# Patient Record
Sex: Female | Born: 1935 | Race: White | Hispanic: No | Marital: Single | Smoking: Former smoker
Health system: Southern US, Community
[De-identification: ages and names within clinical notes are randomized; demographics above are authoritative.]

## PROBLEM LIST (undated history)

## (undated) DIAGNOSIS — M1711 Unilateral primary osteoarthritis, right knee: Secondary | ICD-10-CM

## (undated) DIAGNOSIS — S22080A Wedge compression fracture of T11-T12 vertebra, initial encounter for closed fracture: Secondary | ICD-10-CM

## (undated) DIAGNOSIS — M5136 Other intervertebral disc degeneration, lumbar region: Secondary | ICD-10-CM

## (undated) DIAGNOSIS — M51369 Other intervertebral disc degeneration, lumbar region without mention of lumbar back pain or lower extremity pain: Secondary | ICD-10-CM

## (undated) DIAGNOSIS — E7800 Pure hypercholesterolemia, unspecified: Secondary | ICD-10-CM

## (undated) DIAGNOSIS — Z86010 Personal history of colonic polyps: Secondary | ICD-10-CM

## (undated) DIAGNOSIS — I2510 Atherosclerotic heart disease of native coronary artery without angina pectoris: Secondary | ICD-10-CM

## (undated) DIAGNOSIS — I10 Essential (primary) hypertension: Secondary | ICD-10-CM

## (undated) DIAGNOSIS — R6251 Failure to thrive (child): Secondary | ICD-10-CM

## (undated) DIAGNOSIS — E109 Type 1 diabetes mellitus without complications: Principal | ICD-10-CM

## (undated) DIAGNOSIS — I5032 Chronic diastolic (congestive) heart failure: Secondary | ICD-10-CM

## (undated) DIAGNOSIS — J309 Allergic rhinitis, unspecified: Secondary | ICD-10-CM

## (undated) DIAGNOSIS — F419 Anxiety disorder, unspecified: Secondary | ICD-10-CM

## (undated) DIAGNOSIS — G8929 Other chronic pain: Secondary | ICD-10-CM

## (undated) DIAGNOSIS — F329 Major depressive disorder, single episode, unspecified: Secondary | ICD-10-CM

## (undated) DIAGNOSIS — D649 Anemia, unspecified: Secondary | ICD-10-CM

## (undated) DIAGNOSIS — M48061 Spinal stenosis, lumbar region without neurogenic claudication: Secondary | ICD-10-CM

## (undated) DIAGNOSIS — M479 Spondylosis, unspecified: Secondary | ICD-10-CM

## (undated) DIAGNOSIS — I639 Cerebral infarction, unspecified: Secondary | ICD-10-CM

## (undated) DIAGNOSIS — K219 Gastro-esophageal reflux disease without esophagitis: Secondary | ICD-10-CM

## (undated) HISTORY — DX: Atherosclerotic heart disease of native coronary artery without angina pectoris: I25.10

## (undated) HISTORY — DX: Anxiety disorder, unspecified: F41.9

## (undated) HISTORY — DX: Anemia, unspecified: D64.9

## (undated) HISTORY — DX: Failure to thrive (child): R62.51

## (undated) HISTORY — DX: Pure hypercholesterolemia, unspecified: E78.00

## (undated) HISTORY — DX: Cerebral infarction, unspecified: I63.9

## (undated) HISTORY — PX: APPENDECTOMY: SHX54

## (undated) HISTORY — DX: Other intervertebral disc degeneration, lumbar region: M51.36

## (undated) HISTORY — PX: CHOLECYSTECTOMY: SHX55

## (undated) HISTORY — DX: Spondylosis, unspecified: M47.9

## (undated) HISTORY — DX: Major depressive disorder, single episode, unspecified: F32.9

## (undated) HISTORY — DX: Chronic diastolic (congestive) heart failure: I50.32

## (undated) HISTORY — PX: OTHER SURGICAL HISTORY: SHX169

## (undated) HISTORY — DX: Spinal stenosis, lumbar region without neurogenic claudication: M48.061

## (undated) HISTORY — DX: Personal history of colonic polyps: Z86.010

## (undated) HISTORY — DX: Other intervertebral disc degeneration, lumbar region without mention of lumbar back pain or lower extremity pain: M51.369

## (undated) HISTORY — DX: Type 1 diabetes mellitus without complications: E10.9

## (undated) HISTORY — DX: Other chronic pain: G89.29

## (undated) HISTORY — PX: ABDOMINAL HYSTERECTOMY: SHX81

## (undated) HISTORY — DX: Unilateral primary osteoarthritis, right knee: M17.11

## (undated) HISTORY — DX: Allergic rhinitis, unspecified: J30.9

## (undated) HISTORY — DX: Essential (primary) hypertension: I10

## (undated) HISTORY — DX: Gastro-esophageal reflux disease without esophagitis: K21.9

## (undated) HISTORY — DX: Wedge compression fracture of T11-T12 vertebra, initial encounter for closed fracture: S22.080A

---

## 1998-02-23 ENCOUNTER — Encounter: Admission: RE | Admit: 1998-02-23 | Discharge: 1998-05-24 | Payer: Self-pay | Admitting: Emergency Medicine

## 1999-06-24 ENCOUNTER — Encounter: Payer: Self-pay | Admitting: Orthopedic Surgery

## 1999-06-28 ENCOUNTER — Encounter: Payer: Self-pay | Admitting: Orthopedic Surgery

## 1999-06-28 ENCOUNTER — Encounter (INDEPENDENT_AMBULATORY_CARE_PROVIDER_SITE_OTHER): Payer: Self-pay | Admitting: Specialist

## 1999-06-28 ENCOUNTER — Observation Stay (HOSPITAL_COMMUNITY): Admission: RE | Admit: 1999-06-28 | Discharge: 1999-06-29 | Payer: Self-pay | Admitting: Orthopedic Surgery

## 2000-06-06 ENCOUNTER — Encounter: Admission: RE | Admit: 2000-06-06 | Discharge: 2000-06-06 | Payer: Self-pay | Admitting: Emergency Medicine

## 2000-06-06 ENCOUNTER — Encounter: Payer: Self-pay | Admitting: Emergency Medicine

## 2000-07-07 ENCOUNTER — Other Ambulatory Visit: Admission: RE | Admit: 2000-07-07 | Discharge: 2000-07-07 | Payer: Self-pay | Admitting: Emergency Medicine

## 2001-06-07 ENCOUNTER — Encounter: Payer: Self-pay | Admitting: Emergency Medicine

## 2001-06-07 ENCOUNTER — Encounter: Admission: RE | Admit: 2001-06-07 | Discharge: 2001-06-07 | Payer: Self-pay | Admitting: Emergency Medicine

## 2002-06-10 ENCOUNTER — Encounter: Admission: RE | Admit: 2002-06-10 | Discharge: 2002-06-10 | Payer: Self-pay | Admitting: Emergency Medicine

## 2002-06-10 ENCOUNTER — Encounter: Payer: Self-pay | Admitting: Emergency Medicine

## 2003-05-29 ENCOUNTER — Encounter: Payer: Self-pay | Admitting: Emergency Medicine

## 2003-05-29 ENCOUNTER — Encounter: Admission: RE | Admit: 2003-05-29 | Discharge: 2003-05-29 | Payer: Self-pay | Admitting: Emergency Medicine

## 2003-09-29 ENCOUNTER — Encounter: Admission: RE | Admit: 2003-09-29 | Discharge: 2003-09-29 | Payer: Self-pay | Admitting: Emergency Medicine

## 2004-06-01 ENCOUNTER — Encounter: Admission: RE | Admit: 2004-06-01 | Discharge: 2004-06-01 | Payer: Self-pay | Admitting: Emergency Medicine

## 2004-06-07 ENCOUNTER — Encounter: Admission: RE | Admit: 2004-06-07 | Discharge: 2004-06-07 | Payer: Self-pay | Admitting: Emergency Medicine

## 2004-11-05 ENCOUNTER — Ambulatory Visit: Payer: Self-pay | Admitting: Gastroenterology

## 2004-11-30 ENCOUNTER — Ambulatory Visit: Payer: Self-pay | Admitting: Gastroenterology

## 2004-12-02 ENCOUNTER — Encounter: Admission: RE | Admit: 2004-12-02 | Discharge: 2004-12-02 | Payer: Self-pay | Admitting: Emergency Medicine

## 2005-06-13 ENCOUNTER — Encounter: Admission: RE | Admit: 2005-06-13 | Discharge: 2005-06-13 | Payer: Self-pay | Admitting: General Surgery

## 2006-06-14 ENCOUNTER — Encounter: Admission: RE | Admit: 2006-06-14 | Discharge: 2006-06-14 | Payer: Self-pay | Admitting: Emergency Medicine

## 2007-03-07 ENCOUNTER — Ambulatory Visit: Payer: Self-pay | Admitting: Endocrinology

## 2007-03-22 ENCOUNTER — Ambulatory Visit: Payer: Self-pay | Admitting: Endocrinology

## 2007-04-12 ENCOUNTER — Ambulatory Visit: Payer: Self-pay | Admitting: Endocrinology

## 2007-05-03 ENCOUNTER — Ambulatory Visit: Payer: Self-pay | Admitting: Endocrinology

## 2007-05-30 ENCOUNTER — Encounter: Payer: Self-pay | Admitting: Endocrinology

## 2007-05-30 DIAGNOSIS — I10 Essential (primary) hypertension: Secondary | ICD-10-CM | POA: Insufficient documentation

## 2007-05-30 DIAGNOSIS — E109 Type 1 diabetes mellitus without complications: Secondary | ICD-10-CM

## 2007-05-30 HISTORY — DX: Type 1 diabetes mellitus without complications: E10.9

## 2007-05-30 HISTORY — DX: Essential (primary) hypertension: I10

## 2007-05-31 ENCOUNTER — Ambulatory Visit: Payer: Self-pay | Admitting: Endocrinology

## 2007-07-06 ENCOUNTER — Encounter: Admission: RE | Admit: 2007-07-06 | Discharge: 2007-07-06 | Payer: Self-pay | Admitting: Family Medicine

## 2007-08-06 ENCOUNTER — Ambulatory Visit: Payer: Self-pay | Admitting: Endocrinology

## 2007-08-06 LAB — CONVERTED CEMR LAB: Hgb A1c MFr Bld: 6.9 % — ABNORMAL HIGH (ref 4.6–6.0)

## 2007-09-11 ENCOUNTER — Encounter: Payer: Self-pay | Admitting: Endocrinology

## 2007-09-18 ENCOUNTER — Telehealth (INDEPENDENT_AMBULATORY_CARE_PROVIDER_SITE_OTHER): Payer: Self-pay | Admitting: *Deleted

## 2007-10-10 ENCOUNTER — Ambulatory Visit: Payer: Self-pay | Admitting: Endocrinology

## 2007-12-25 ENCOUNTER — Ambulatory Visit: Payer: Self-pay | Admitting: Endocrinology

## 2007-12-25 DIAGNOSIS — M479 Spondylosis, unspecified: Secondary | ICD-10-CM

## 2007-12-25 HISTORY — DX: Spondylosis, unspecified: M47.9

## 2007-12-25 LAB — CONVERTED CEMR LAB: Hgb A1c MFr Bld: 7.2 % — ABNORMAL HIGH (ref 4.6–6.0)

## 2008-01-03 ENCOUNTER — Emergency Department (HOSPITAL_COMMUNITY): Admission: EM | Admit: 2008-01-03 | Discharge: 2008-01-04 | Payer: Self-pay | Admitting: Emergency Medicine

## 2008-03-24 ENCOUNTER — Ambulatory Visit: Payer: Self-pay | Admitting: Endocrinology

## 2008-03-24 LAB — CONVERTED CEMR LAB: Hgb A1c MFr Bld: 7.2 % — ABNORMAL HIGH (ref 4.6–6.0)

## 2008-05-25 ENCOUNTER — Emergency Department (HOSPITAL_COMMUNITY): Admission: EM | Admit: 2008-05-25 | Discharge: 2008-05-25 | Payer: Self-pay | Admitting: Emergency Medicine

## 2008-05-26 ENCOUNTER — Inpatient Hospital Stay (HOSPITAL_COMMUNITY): Admission: EM | Admit: 2008-05-26 | Discharge: 2008-05-30 | Payer: Self-pay | Admitting: Emergency Medicine

## 2008-06-24 ENCOUNTER — Encounter: Admission: RE | Admit: 2008-06-24 | Discharge: 2008-06-24 | Payer: Self-pay | Admitting: Internal Medicine

## 2008-07-24 ENCOUNTER — Encounter: Admission: RE | Admit: 2008-07-24 | Discharge: 2008-07-24 | Payer: Self-pay | Admitting: Neurosurgery

## 2008-10-23 ENCOUNTER — Ambulatory Visit: Payer: Self-pay | Admitting: Endocrinology

## 2008-10-23 DIAGNOSIS — E7800 Pure hypercholesterolemia, unspecified: Secondary | ICD-10-CM

## 2008-10-23 HISTORY — DX: Pure hypercholesterolemia, unspecified: E78.00

## 2008-10-23 LAB — CONVERTED CEMR LAB
ALT: 27 units/L (ref 0–35)
Bilirubin, Direct: 0.1 mg/dL (ref 0.0–0.3)
CO2: 27 meq/L (ref 19–32)
Calcium: 9.4 mg/dL (ref 8.4–10.5)
Chloride: 101 meq/L (ref 96–112)
Direct LDL: 99.4 mg/dL
GFR calc non Af Amer: 65 mL/min
HDL: 33 mg/dL — ABNORMAL LOW (ref >39.0)
Sodium: 138 meq/L (ref 135–145)
Total Bilirubin: 0.4 mg/dL (ref 0.3–1.2)
Total CHOL/HDL Ratio: 4.5

## 2008-10-28 ENCOUNTER — Telehealth: Payer: Self-pay | Admitting: Endocrinology

## 2009-01-27 ENCOUNTER — Encounter: Admission: RE | Admit: 2009-01-27 | Discharge: 2009-01-27 | Payer: Self-pay | Admitting: Neurosurgery

## 2009-02-19 ENCOUNTER — Telehealth (INDEPENDENT_AMBULATORY_CARE_PROVIDER_SITE_OTHER): Payer: Self-pay | Admitting: *Deleted

## 2009-03-06 ENCOUNTER — Inpatient Hospital Stay (HOSPITAL_COMMUNITY): Admission: RE | Admit: 2009-03-06 | Discharge: 2009-03-08 | Payer: Self-pay | Admitting: Neurosurgery

## 2009-03-21 ENCOUNTER — Inpatient Hospital Stay (HOSPITAL_COMMUNITY): Admission: EM | Admit: 2009-03-21 | Discharge: 2009-03-26 | Payer: Self-pay | Admitting: Emergency Medicine

## 2009-03-25 ENCOUNTER — Ambulatory Visit: Payer: Self-pay | Admitting: Physical Medicine & Rehabilitation

## 2009-03-26 ENCOUNTER — Inpatient Hospital Stay (HOSPITAL_COMMUNITY)
Admission: RE | Admit: 2009-03-26 | Discharge: 2009-04-09 | Payer: Self-pay | Admitting: Physical Medicine & Rehabilitation

## 2009-03-26 ENCOUNTER — Ambulatory Visit: Payer: Self-pay | Admitting: Physical Medicine & Rehabilitation

## 2009-04-19 ENCOUNTER — Inpatient Hospital Stay (HOSPITAL_COMMUNITY): Admission: EM | Admit: 2009-04-19 | Discharge: 2009-04-21 | Payer: Self-pay | Admitting: Emergency Medicine

## 2009-04-28 ENCOUNTER — Inpatient Hospital Stay (HOSPITAL_COMMUNITY): Admission: RE | Admit: 2009-04-28 | Discharge: 2009-05-06 | Payer: Self-pay | Admitting: Neurosurgery

## 2009-06-24 ENCOUNTER — Telehealth: Payer: Self-pay | Admitting: Endocrinology

## 2009-06-26 ENCOUNTER — Emergency Department (HOSPITAL_COMMUNITY): Admission: EM | Admit: 2009-06-26 | Discharge: 2009-06-27 | Payer: Self-pay | Admitting: Emergency Medicine

## 2009-06-27 ENCOUNTER — Inpatient Hospital Stay (HOSPITAL_COMMUNITY): Admission: EM | Admit: 2009-06-27 | Discharge: 2009-07-07 | Payer: Self-pay | Admitting: Emergency Medicine

## 2009-06-30 ENCOUNTER — Encounter: Admission: RE | Admit: 2009-06-30 | Discharge: 2009-06-30 | Payer: Self-pay | Admitting: Internal Medicine

## 2009-07-30 ENCOUNTER — Encounter (INDEPENDENT_AMBULATORY_CARE_PROVIDER_SITE_OTHER): Payer: Self-pay | Admitting: Internal Medicine

## 2009-07-30 ENCOUNTER — Inpatient Hospital Stay (HOSPITAL_COMMUNITY): Admission: EM | Admit: 2009-07-30 | Discharge: 2009-08-01 | Payer: Self-pay | Admitting: Emergency Medicine

## 2009-07-30 ENCOUNTER — Ambulatory Visit: Payer: Self-pay | Admitting: Vascular Surgery

## 2009-08-27 ENCOUNTER — Encounter (INDEPENDENT_AMBULATORY_CARE_PROVIDER_SITE_OTHER): Payer: Self-pay | Admitting: *Deleted

## 2009-09-21 ENCOUNTER — Encounter: Payer: Self-pay | Admitting: Endocrinology

## 2009-11-16 ENCOUNTER — Ambulatory Visit: Payer: Self-pay | Admitting: Endocrinology

## 2009-11-16 LAB — CONVERTED CEMR LAB: Hgb A1c MFr Bld: 8.1 % — ABNORMAL HIGH (ref 4.6–6.5)

## 2009-11-30 ENCOUNTER — Ambulatory Visit: Payer: Self-pay | Admitting: Endocrinology

## 2009-12-03 ENCOUNTER — Telehealth: Payer: Self-pay | Admitting: Endocrinology

## 2009-12-14 ENCOUNTER — Ambulatory Visit: Payer: Self-pay | Admitting: Endocrinology

## 2010-01-13 ENCOUNTER — Ambulatory Visit: Payer: Self-pay | Admitting: Endocrinology

## 2010-01-14 ENCOUNTER — Encounter: Admission: RE | Admit: 2010-01-14 | Discharge: 2010-01-14 | Payer: Self-pay | Admitting: Family Medicine

## 2010-02-19 ENCOUNTER — Ambulatory Visit: Payer: Self-pay | Admitting: Endocrinology

## 2010-02-22 LAB — CONVERTED CEMR LAB: Hgb A1c MFr Bld: 7.8 % — ABNORMAL HIGH (ref 4.6–6.5)

## 2010-04-02 ENCOUNTER — Ambulatory Visit: Payer: Self-pay | Admitting: Endocrinology

## 2010-04-02 DIAGNOSIS — L97909 Non-pressure chronic ulcer of unspecified part of unspecified lower leg with unspecified severity: Secondary | ICD-10-CM | POA: Insufficient documentation

## 2010-04-07 ENCOUNTER — Encounter (HOSPITAL_BASED_OUTPATIENT_CLINIC_OR_DEPARTMENT_OTHER)
Admission: RE | Admit: 2010-04-07 | Discharge: 2010-07-06 | Payer: Self-pay | Source: Home / Self Care | Admitting: Internal Medicine

## 2010-04-08 ENCOUNTER — Encounter: Payer: Self-pay | Admitting: Endocrinology

## 2010-04-08 ENCOUNTER — Ambulatory Visit (HOSPITAL_COMMUNITY): Admission: RE | Admit: 2010-04-08 | Discharge: 2010-04-08 | Payer: Self-pay | Admitting: Internal Medicine

## 2010-04-09 ENCOUNTER — Telehealth: Payer: Self-pay | Admitting: Endocrinology

## 2010-04-14 ENCOUNTER — Ambulatory Visit: Payer: Self-pay | Admitting: Vascular Surgery

## 2010-06-01 ENCOUNTER — Encounter: Payer: Self-pay | Admitting: Endocrinology

## 2010-06-11 ENCOUNTER — Ambulatory Visit: Payer: Self-pay | Admitting: Endocrinology

## 2010-06-11 LAB — CONVERTED CEMR LAB: Hgb A1c MFr Bld: 7.2 % — ABNORMAL HIGH (ref 4.6–6.5)

## 2010-07-02 ENCOUNTER — Telehealth: Payer: Self-pay | Admitting: Endocrinology

## 2010-07-13 ENCOUNTER — Encounter (HOSPITAL_BASED_OUTPATIENT_CLINIC_OR_DEPARTMENT_OTHER)
Admission: RE | Admit: 2010-07-13 | Discharge: 2010-08-24 | Payer: Self-pay | Source: Home / Self Care | Admitting: General Surgery

## 2010-08-09 ENCOUNTER — Encounter: Payer: Self-pay | Admitting: Endocrinology

## 2010-09-01 ENCOUNTER — Encounter: Payer: Self-pay | Admitting: Endocrinology

## 2010-09-03 ENCOUNTER — Ambulatory Visit: Payer: Self-pay | Admitting: Endocrinology

## 2010-09-03 LAB — CONVERTED CEMR LAB: Hgb A1c MFr Bld: 7.4 % — ABNORMAL HIGH (ref 4.6–6.5)

## 2010-09-24 ENCOUNTER — Encounter (HOSPITAL_BASED_OUTPATIENT_CLINIC_OR_DEPARTMENT_OTHER)
Admission: RE | Admit: 2010-09-24 | Discharge: 2010-10-07 | Payer: Self-pay | Source: Home / Self Care | Attending: General Surgery | Admitting: General Surgery

## 2010-09-30 ENCOUNTER — Encounter: Payer: Self-pay | Admitting: Endocrinology

## 2010-10-18 IMAGING — CR DG HIP W/ PELVIS BILAT
5 series · 5 of 5 positions shown · non-contrast
Comparison: None

CLINICAL DATA: Fell yesterday with back pain and bilateral hip pain
radiating down the legs

BILATERAL HIP WITH PELVIS - 4+ VIEW

[t pelvis a.p. *]
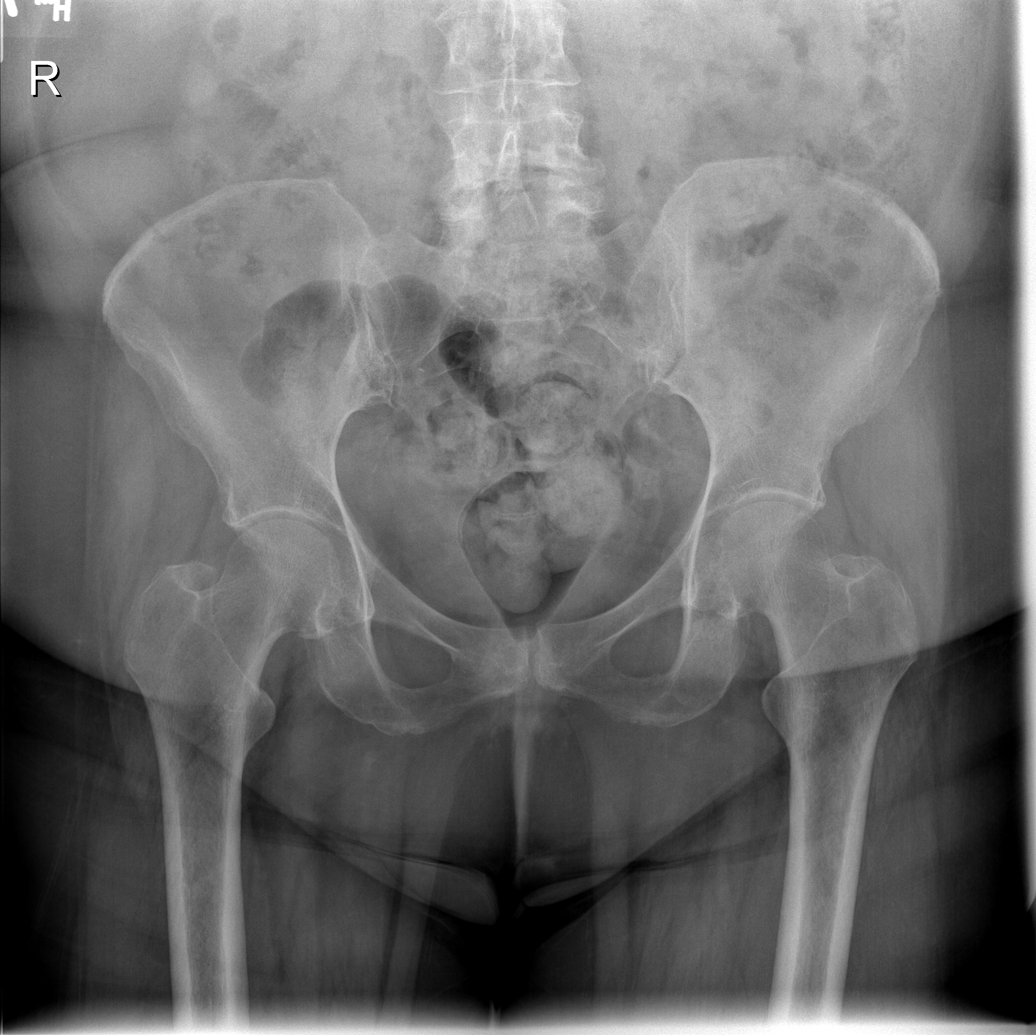

[t hip ap right *]
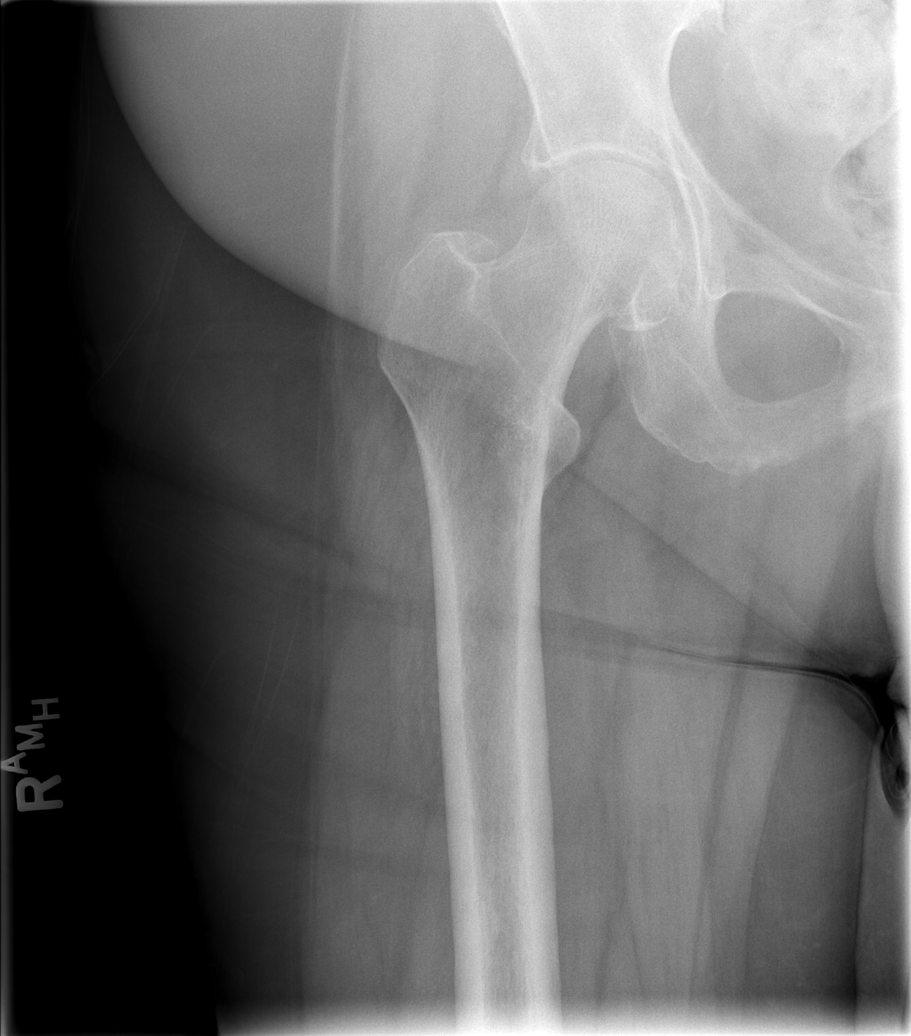

[t hip ap left *]
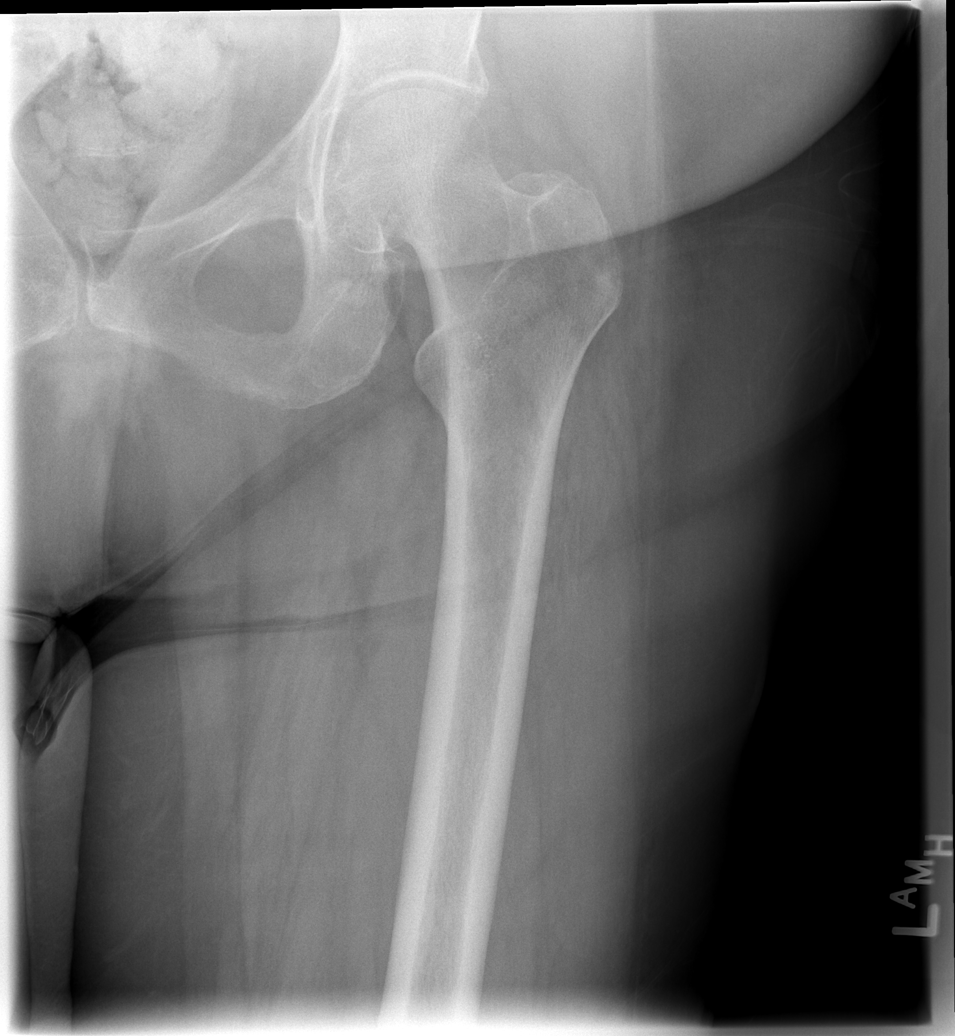

[t hip frog leg left *]
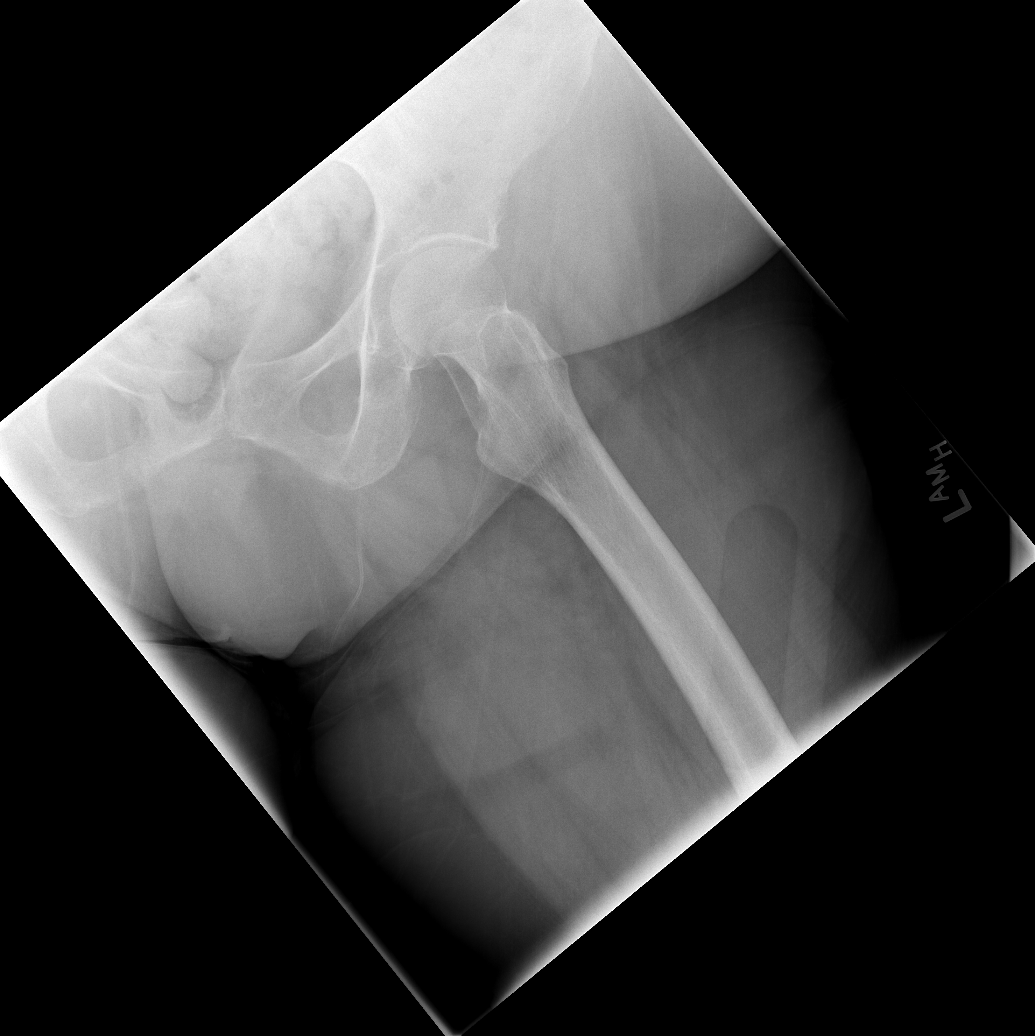

[t hip frog leg right *]
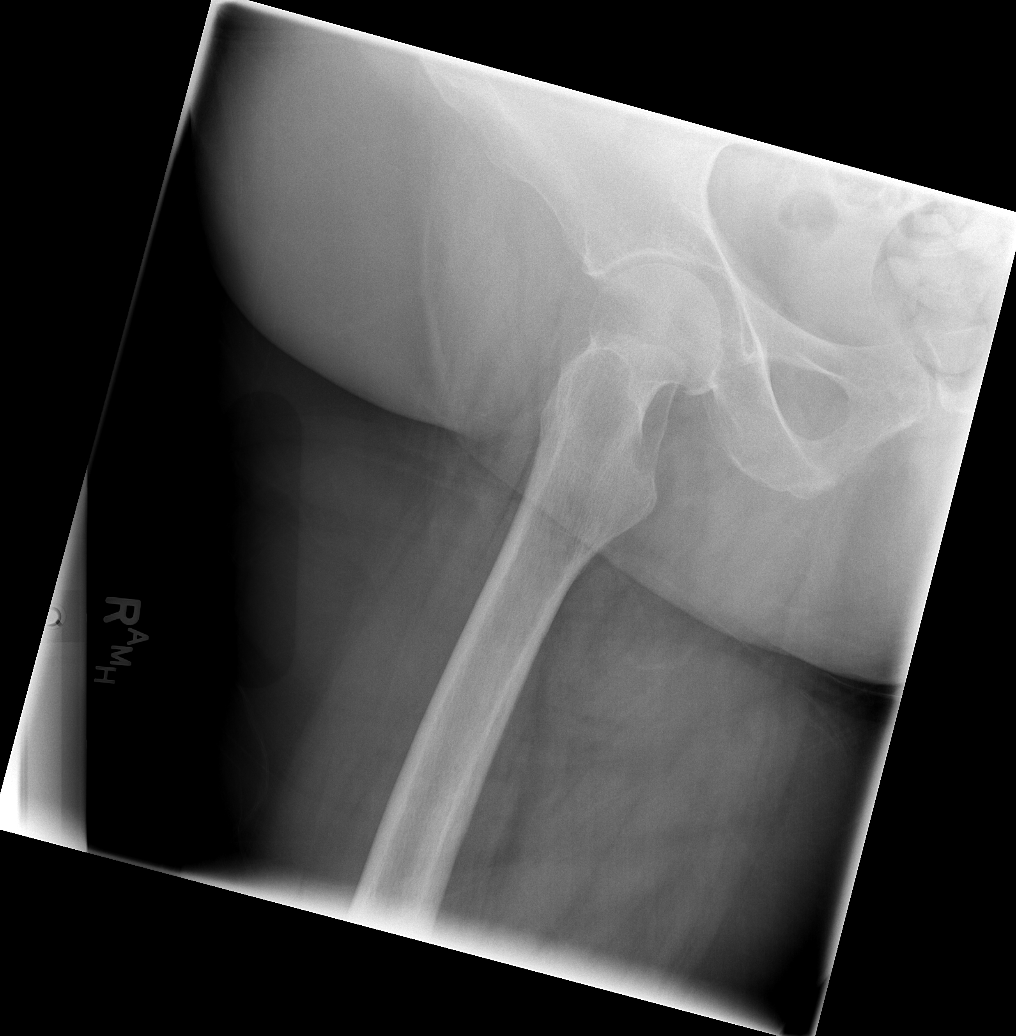

[5 of 5 positions shown; findings below may reference images not displayed]

FINDINGS: A view of the pelvis and two views of both hips were
obtained.  No acute fracture is seen.  The pelvic rami are intact.
The SI joints appear normal.  There are degenerative changes at the
L4-5 level.
IMPRESSION: No acute fracture.  Degenerative change at L4-5.

## 2010-11-16 NOTE — Assessment & Plan Note (Signed)
Summary: 3 mth fu--stc   Vital Signs:  Patient profile:   77 year old female Height:      63 inches (160.02 cm) Weight:      192.38 pounds (87.45 kg) BMI:     34.20 O2 Sat:      95 % on Room air Temp:     99.1 degrees F (37.28 degrees C) oral Pulse rate:   96 / minute BP sitting:   138 / 70  (left arm) Cuff size:   large  Vitals Entered By: Brenton Grills CMA Duncan Dull) (September 03, 2010 1:07 PM)  O2 Flow:  Room air CC: 3 month F/U/pt is no longer taking Zetia/aj Is Patient Diabetic? Yes   Primary Provider:  a ross  CC:  3 month F/U/pt is no longer taking Zetia/aj.  History of Present Illness: she brings a record of her cbg's which i have reviewed today.  it is lowest in am (60-90).  it is highest before lunch (mid-100's).  pt states she feels well in general.  Current Medications (verified): 1)  Chlordiazepoxide Hcl 5 Mg  Caps (Chlordiazepoxide Hcl) .... Take 1 By Mouth Two Times A Day Qd 2)  Prilosec 20 Mg  Cpdr (Omeprazole) .... Take 1 By Mouth Qd 3)  Reglan 10 Mg  Tabs (Metoclopramide Hcl) .... Take 1 By Mouth Qd 4)  Prinzide 20-25 Mg  Tabs (Lisinopril-Hydrochlorothiazide) .... Take 1 By Mouth Qd 5)  Lipitor 80 Mg  Tabs (Atorvastatin Calcium) .... Take 1 By Mouth Qd 6)  Pamelor 25 Mg  Caps (Nortriptyline Hcl) .... Take 1 By Mouth Qd 7)  Allegra 180 Mg  Tabs (Fexofenadine Hcl) .... Take 1 By Mouth Qd 8)  Flonase 50 Mcg/act  Susp (Fluticasone Propionate) .... Use 1 Spray Each Nostril Qhs 9)  Evista 60 Mg  Tabs (Raloxifene Hcl) .... Take 1 By Mouth Qd 10)  Toprol Xl 25 Mg  Tb24 (Metoprolol Succinate) .... Take 1 By Mouth Qd 11)  Humalog 100 Unit/ml Soln (Insulin Lispro (Human)) .... Inject Tid (Qac) 40-30-45 Units 12)  Humulin N 100 Unit/ml Susp (Insulin Isophane Human) .... Inject 70 Unit Subcutaneously Every Night 13)  Prozac 20 Mg  Caps (Fluoxetine Hcl) .... Take 1 By Mouth Qd 14)  Zetia 10 Mg  Tabs (Ezetimibe) .... Take 1 By Mouth Qd 15)  Ultilet Insulin Syringe Jeannifer Drakeford  30g X 5/16" 1 Ml  Misc (Insulin Syringe-Needle U-100) .... Use Qid 16)  Accu-Chek Comfort Curve   Strp (Glucose Blood) .... Use As Directed 17)  Accu-Chek Aviva  Strp (Glucose Blood) .... Check Blood Sugar Qid 18)  Bd Insulin Syringe Ultrafine 31g X 5/16" 1 Ml Misc (Insulin Syringe-Needle U-100) .... Use As Directed Qid 19)  Oxycodone-Acetaminophen 5-325 Mg Tabs (Oxycodone-Acetaminophen) .Marland Kitchen.. 1 or 2 Tabs Daily 20)  Lisinopril 20 Mg Tabs (Lisinopril) .Marland Kitchen.. 1 in Am 21)  Furosemide 20 Mg Tabs (Furosemide) .... 2 in Am 22)  Gabapentin 400 Mg Caps (Gabapentin) .Marland Kitchen.. 1 Every 8 Hours  Allergies (verified): 1)  ! Demerol 2)  ! Codeine 3)  ! Voltaren 4)  ! Erythromycin 5)  ! Sulfa 6)  ! Ceclor 7)  ! Baclofen 8)  ! Darvocet 9)  ! * Promethazine  Past History:  Past Medical History: Last updated: 06/11/2010 FOOT ULCER, RIGHT (ICD-707.10) HYPERCHOLESTEROLEMIA (ICD-272.0) ANTIHYPERLIPIDEMIC USE, LONG TERM (ICD-V58.69) OSTEOARTHRITIS, LUMBAR SPINE (ICD-721.90) HYPERTENSION (ICD-401.9) DIABETES MELLITUS, TYPE I (ICD-250.01)  Review of Systems  The patient denies syncope.    Physical Exam  General:  normal appearance.   Extremities:  (sees wound-care) Skin:  injection sites at anterior abdomen are normal except for a few ecchymoses. Additional Exam:  Hemoglobin A1C       [H]  7.4 %     Impression & Recommendations:  Problem # 1:  DIABETES MELLITUS, TYPE I (ICD-250.01) she needs some adjustment in her therapy  Medications Added to Medication List This Visit: 1)  Humalog 100 Unit/ml Soln (Insulin lispro (human)) .... Inject tid (qac) 40-35-45 units 2)  Humulin N 100 Unit/ml Susp (Insulin isophane human) .... Inject 60 unit subcutaneously every night  Other Orders: TLB-A1C / Hgb A1C (Glycohemoglobin) (83036-A1C) Est. Patient Level III (16109)  Patient Instructions: 1)  blood tests are being ordered for you today.  please call [phone] to hear your test results. 2)  pending the  test results, please: 3)  reduce nph insulin to 60 units at night. 4)  continue humalog three times a day (just before each meal) 40-30-45 units. 5)  Please schedule a follow-up appointment in 4 months. 6)  check your blood sugar 4 times a day--before the 3 meals, and at bedtime.  also check if you have symptoms of your blood sugar being too high or too low.  please keep a record of the readings and bring it to your next appointment here.  please call us sooner if you are having low blood sugar episodes. 7)  (update: i left message on phone-tree:  increase breakfast humalog to 35 units). Prescriptions: HUMALOG 100 UNIT/ML SOLN (INSULIN LISPRO (HUMAN)) Inject tid (qac) 40-30-45 units  #1 vial x 0   Entered and Authorized by:   Minus Breeding MD   Signed by:   Minus Breeding MD on 09/03/2010   Method used:   Print then Give to Patient   RxID:   [id number]    Orders Added: 1)  TLB-A1C / Hgb A1C (Glycohemoglobin) [83036-A1C] 2)  Est. Patient Level III [86578]

## 2010-11-16 NOTE — Progress Notes (Signed)
Summary: CBG  Phone Note Call from Patient Call back at Home Phone [phone]   Caller: Patient Summary of Call: pt called stating that her CBGs have been dropping during the night. pt says that yesterday morning @ 4:30 - 71. This morning @ 1:30 - 66. pt is very concerned about this, please advise Initial call taken by: Margaret Pyle, CMA,  December 03, 2009 2:40 PM  Follow-up for Phone Call        reduce nph back to 60 units at bedtime  Follow-up by: Minus Breeding MD,  December 03, 2009 2:55 PM  Additional Follow-up for Phone Call Additional follow up Details #1::        pt informed Additional Follow-up by: Margaret Pyle, CMA,  December 03, 2009 3:33 PM

## 2010-11-16 NOTE — Progress Notes (Signed)
Summary: [facility]  Phone Note Other Incoming   CallerJudeth [place] with [facility] [phone] Summary of Call: HHRN called to inform MD that she was sent to eval pt's wound by wound center. Rn wants MD to be aware that pt is non-compliant with DM. Her CBG today was 368 and it was only then that she took 40u of Humolog. [facility] will start pt on DM teaching as well as wound care. RN does not think pt took her insulin any other time during the day. Initial call taken by: Margaret Pyle, CMA,  April 09, 2010 4:10 PM  Follow-up for Phone Call        noted, thank you Follow-up by: Minus Breeding MD,  April 09, 2010 6:44 PM

## 2010-11-16 NOTE — Assessment & Plan Note (Signed)
Summary: 1 MTH FU  STC  RS'D PER PT/NWS   Vital Signs:  Patient profile:   77 year old female Height:      63 inches (160.02 cm) Weight:      183.25 pounds (83.30 kg) O2 Sat:      97 % on Room air Temp:     98.3 degrees F (36.83 degrees C) oral Pulse rate:   96 / minute BP sitting:   148 / 82  (left arm) Cuff size:   large  Vitals Entered By: Josph Macho RMA (Feb 19, 2010 1:17 PM)  O2 Flow:  Room air CC: 1 month follow up/ pt states she is taking Prednisone but her last one is tomorrow/ CF Is Patient Diabetic? Yes   Primary Provider:  a ross  CC:  1 month follow up/ pt states she is taking Prednisone but her last one is tomorrow/ CF.  History of Present Illness: she brings a record of her cbg's which i have reviewed today.  it is in the high-100's, with no trend throughout the day.  pt states she feels well in general, except for left shoulder bursitis.  Current Medications (verified): 1)  Chlordiazepoxide Hcl 5 Mg  Caps (Chlordiazepoxide Hcl) .... Take 1 By Mouth Two Times A Day Qd 2)  Prilosec 20 Mg  Cpdr (Omeprazole) .... Take 1 By Mouth Qd 3)  Reglan 10 Mg  Tabs (Metoclopramide Hcl) .... Take 1 By Mouth Qd 4)  Prinzide 20-25 Mg  Tabs (Lisinopril-Hydrochlorothiazide) .... Take 1 By Mouth Qd 5)  Lipitor 80 Mg  Tabs (Atorvastatin Calcium) .... Take 1 By Mouth Qd 6)  Pamelor 25 Mg  Caps (Nortriptyline Hcl) .... Take 1 By Mouth Qd 7)  Allegra 180 Mg  Tabs (Fexofenadine Hcl) .... Take 1 By Mouth Qd 8)  Flonase 50 Mcg/act  Susp (Fluticasone Propionate) .... Use 1 Spray Each Nostril Qhs 9)  Evista 60 Mg  Tabs (Raloxifene Hcl) .... Take 1 By Mouth Qd 10)  Toprol Xl 25 Mg  Tb24 (Metoprolol Succinate) .... Take 1 By Mouth Qd 11)  Humalog 100 Unit/ml Soln (Insulin Lispro (Human)) .... Inject Tid (Qac) 35-15-40 Units 12)  Humulin N 100 Unit/ml Susp (Insulin Isophane Human) .... Inject 70 Unit Subcutaneously Every Night 13)  Prozac 20 Mg  Caps (Fluoxetine Hcl) .... Take 1 By Mouth  Qd 14)  Zetia 10 Mg  Tabs (Ezetimibe) .... Take 1 By Mouth Qd 15)  Ultilet Insulin Syringe Short 30g X 5/16" 1 Ml  Misc (Insulin Syringe-Needle U-100) .... Use Qid 16)  Accu-Chek Comfort Curve   Strp (Glucose Blood) .... Use As Directed 17)  Accu-Chek Aviva  Strp (Glucose Blood) .... Check Blood Sugar Qid 18)  Bd Insulin Syringe Ultrafine 31g X 5/16" 1 Ml Misc (Insulin Syringe-Needle U-100) .... Use As Directed Qid 19)  Oxycodone-Acetaminophen 5-325 Mg Tabs (Oxycodone-Acetaminophen) .Marland Kitchen.. 1 or 2 Tabs Daily 20)  Lisinopril 20 Mg Tabs (Lisinopril) .... 2 in Am 21)  Furosemide 20 Mg Tabs (Furosemide) .... 2 in Am 22)  Gabapentin 400 Mg Caps (Gabapentin) .Marland Kitchen.. 1 Every 8 Hours  Allergies (verified): 1)  ! Demerol 2)  ! Codeine 3)  ! Voltaren 4)  ! Erythromycin 5)  ! Sulfa 6)  ! Ceclor 7)  ! Baclofen 8)  ! Darvocet 9)  ! * Promethazine  Past History:  Past Medical History: Last updated: 12/25/2007 Dyslipidemia  OSTEOARTHRITIS, LUMBAR SPINE (ICD-721.90) HYPERTENSION (ICD-401.9) DIABETES MELLITUS, TYPE I (ICD-250.01)  Review of Systems  The patient denies hypoglycemia.    Physical Exam  General:  normal appearance.   Psych:  Alert and cooperative; normal mood and affect; normal attention span and concentration.     Impression & Recommendations:  Problem # 1:  DIABETES MELLITUS, TYPE I (ICD-250.01) needs increased rx  Medications Added to Medication List This Visit: 1)  Humalog 100 Unit/ml Soln (Insulin lispro (human)) .... Inject tid (qac) 40-20-45 units  Other Orders: TLB-A1C / Hgb A1C (Glycohemoglobin) (83036-A1C) Est. Patient Level III (78295)  Patient Instructions: 1)  continue nph insulin 70 units at night. 2)  increase humalog to three times a day (just before each meal) 40-20-45 units. 3)  Please schedule a follow-up appointment in 6 weeks. 4)  check your blood sugar 4 times a day--before the 3 meals, and at bedtime.  also check if you have symptoms of your blood  sugar being too high or too low.  please keep a record of the readings and bring it to your next appointment here.  please call us sooner if you are having low blood sugar episodes.

## 2010-11-16 NOTE — Assessment & Plan Note (Signed)
Summary: 2 wk fu---stc   Vital Signs:  Patient profile:   77 year old female Height:      63 inches (160.02 cm) Weight:      194.50 pounds (88.41 kg) O2 Sat:      95 % on Room air Temp:     97.3 degrees F (36.28 degrees C) oral Pulse rate:   109 / minute BP sitting:   182 / 92  (left arm) Cuff size:   large  Vitals Entered By: Josph Macho RMA (December 14, 2009 8:58 AM)  O2 Flow:  Room air CC: 2 week follow up/ CF Is Patient Diabetic? Yes   Primary Provider:  a ross  CC:  2 week follow up/ CF.  History of Present Illness: since last ov, pt has had 3 episodes of hypoglycemia at hs.  she brings a record of her cbg's which i have reviewed today.  it is highest before lunch (200).  she tripped and fell at home 1 week ago, and bruised her left knee.  Current Medications (verified): 1)  Chlordiazepoxide Hcl 5 Mg  Caps (Chlordiazepoxide Hcl) .... Take 1 By Mouth Two Times A Day Qd 2)  Prilosec 20 Mg  Cpdr (Omeprazole) .... Take 1 By Mouth Qd 3)  Reglan 10 Mg  Tabs (Metoclopramide Hcl) .... Take 1 By Mouth Qd 4)  Prinzide 20-25 Mg  Tabs (Lisinopril-Hydrochlorothiazide) .... Take 1 By Mouth Qd 5)  Lipitor 80 Mg  Tabs (Atorvastatin Calcium) .... Take 1 By Mouth Qd 6)  Pamelor 25 Mg  Caps (Nortriptyline Hcl) .... Take 1 By Mouth Qd 7)  Allegra 180 Mg  Tabs (Fexofenadine Hcl) .... Take 1 By Mouth Qd 8)  Flonase 50 Mcg/act  Susp (Fluticasone Propionate) .... Use 1 Spray Each Nostril Qhs 9)  Evista 60 Mg  Tabs (Raloxifene Hcl) .... Take 1 By Mouth Qd 10)  Toprol Xl 25 Mg  Tb24 (Metoprolol Succinate) .... Take 1 By Mouth Qd 11)  Humalog 100 Unit/ml Soln (Insulin Lispro (Human)) .... Inject Tid (Qac) 30-15-50 Units 12)  Humulin N 100 Unit/ml Susp (Insulin Isophane Human) .... Inject 75 Unit Subcutaneously Every Night 13)  Prozac 20 Mg  Caps (Fluoxetine Hcl) .... Take 1 By Mouth Qd 14)  Zetia 10 Mg  Tabs (Ezetimibe) .... Take 1 By Mouth Qd 15)  Ultilet Insulin Syringe Short 30g X 5/16"  1 Ml  Misc (Insulin Syringe-Needle U-100) .... Use Qid 16)  Accu-Chek Comfort Curve   Strp (Glucose Blood) .... Use As Directed 17)  Accu-Chek Aviva  Strp (Glucose Blood) .... Check Blood Sugar Qid 18)  Bd Insulin Syringe Ultrafine 31g X 5/16" 1 Ml Misc (Insulin Syringe-Needle U-100) .... Use As Directed Qid 19)  Oxycodone-Acetaminophen 5-325 Mg Tabs (Oxycodone-Acetaminophen) .Marland Kitchen.. 1 or 2 Tabs Daily 20)  Lisinopril 20 Mg Tabs (Lisinopril) .... 2 in Am 21)  Furosemide 20 Mg Tabs (Furosemide) .... 2 in Am 22)  Gabapentin 400 Mg Caps (Gabapentin) .Marland Kitchen.. 1 Every 8 Hours  Allergies (verified): 1)  ! Demerol 2)  ! Codeine 3)  ! Voltaren 4)  ! Erythromycin 5)  ! Sulfa 6)  ! Ceclor 7)  ! Baclofen 8)  ! Darvocet 9)  ! * Promethazine  Past History:  Past Medical History: Last updated: 12/25/2007 Dyslipidemia  OSTEOARTHRITIS, LUMBAR SPINE (ICD-721.90) HYPERTENSION (ICD-401.9) DIABETES MELLITUS, TYPE I (ICD-250.01)  Social History: Reviewed history from 11/16/2009 and no changes required. separated 2010 lives alone  Review of Systems  The patient denies syncope.  Physical Exam  General:  obese.  no distress  Msk:  gait is slow but steady (she has a cane but is not using it).   Impression & Recommendations:  Problem # 1:  DIABETES MELLITUS, TYPE I (ICD-250.01) she needs slight adjustments according to her cbg record.  Medications Added to Medication List This Visit: 1)  Humalog 100 Unit/ml Soln (Insulin lispro (human)) .... Inject tid (qac) 35-15-40 units  Other Orders: Est. Patient Level III (14782)  Patient Instructions: 1)  continue nph insulin 75 units at night. 2)  change humalog three times a day (just before each meal) 35-15-40 units. 3)  Please schedule a follow-up appointment in 39month. 4)  check your blood sugar 4 times a day--before the 3 meals, and at bedtime.  also check if you have symptoms of your blood sugar being too high or too low.  please keep a  record of the readings and bring it to your next appointment here.  please call us sooner if you are having low blood sugar episodes. 5)  this is up to your primary dr Tenny Craw, but i think you should use your cane.  also please see dr Tenny Craw for your blood pressure.

## 2010-11-16 NOTE — Assessment & Plan Note (Signed)
Summary: 2 MTH FU--STC   Vital Signs:  Patient profile:   77 year old female Height:      63 inches (160.02 cm) Weight:      183 pounds (83.18 kg) BMI:     32.53 O2 Sat:      95 % on Room air Temp:     97.8 degrees F (36.56 degrees C) oral Pulse rate:   86 / minute BP sitting:   116 / 64  (left arm) Cuff size:   large  Vitals Entered By: Brenton Grills MA (June 11, 2010 1:08 PM)  O2 Flow:  Room air CC: 2 month F/U/aj Is Patient Diabetic? Yes   Primary Provider:  a ross  CC:  2 month F/U/aj.  History of Present Illness: pt states he feels well in general.  she brings a record of her cbg's which i have reviewed today.  they are consistently in the low-100's, with no trend throughout the day.    Current Medications (verified): 1)  Chlordiazepoxide Hcl 5 Mg  Caps (Chlordiazepoxide Hcl) .... Take 1 By Mouth Two Times A Day Qd 2)  Prilosec 20 Mg  Cpdr (Omeprazole) .... Take 1 By Mouth Qd 3)  Reglan 10 Mg  Tabs (Metoclopramide Hcl) .... Take 1 By Mouth Qd 4)  Prinzide 20-25 Mg  Tabs (Lisinopril-Hydrochlorothiazide) .... Take 1 By Mouth Qd 5)  Lipitor 80 Mg  Tabs (Atorvastatin Calcium) .... Take 1 By Mouth Qd 6)  Pamelor 25 Mg  Caps (Nortriptyline Hcl) .... Take 1 By Mouth Qd 7)  Allegra 180 Mg  Tabs (Fexofenadine Hcl) .... Take 1 By Mouth Qd 8)  Flonase 50 Mcg/act  Susp (Fluticasone Propionate) .... Use 1 Spray Each Nostril Qhs 9)  Evista 60 Mg  Tabs (Raloxifene Hcl) .... Take 1 By Mouth Qd 10)  Toprol Xl 25 Mg  Tb24 (Metoprolol Succinate) .... Take 1 By Mouth Qd 11)  Humalog 100 Unit/ml Soln (Insulin Lispro (Human)) .... Inject Tid (Qac) 40-30-45 Units 12)  Humulin N 100 Unit/ml Susp (Insulin Isophane Human) .... Inject 70 Unit Subcutaneously Every Night 13)  Prozac 20 Mg  Caps (Fluoxetine Hcl) .... Take 1 By Mouth Qd 14)  Zetia 10 Mg  Tabs (Ezetimibe) .... Take 1 By Mouth Qd 15)  Ultilet Insulin Syringe Short 30g X 5/16" 1 Ml  Misc (Insulin Syringe-Needle U-100) .... Use  Qid 16)  Accu-Chek Comfort Curve   Strp (Glucose Blood) .... Use As Directed 17)  Accu-Chek Aviva  Strp (Glucose Blood) .... Check Blood Sugar Qid 18)  Bd Insulin Syringe Ultrafine 31g X 5/16" 1 Ml Misc (Insulin Syringe-Needle U-100) .... Use As Directed Qid 19)  Oxycodone-Acetaminophen 5-325 Mg Tabs (Oxycodone-Acetaminophen) .Marland Kitchen.. 1 or 2 Tabs Daily 20)  Lisinopril 20 Mg Tabs (Lisinopril) .Marland Kitchen.. 1 in Am 21)  Furosemide 20 Mg Tabs (Furosemide) .... 2 in Am 22)  Gabapentin 400 Mg Caps (Gabapentin) .Marland Kitchen.. 1 Every 8 Hours  Allergies (verified): 1)  ! Demerol 2)  ! Codeine 3)  ! Voltaren 4)  ! Erythromycin 5)  ! Sulfa 6)  ! Ceclor 7)  ! Baclofen 8)  ! Darvocet 9)  ! * Promethazine  Past History:  Past Medical History: FOOT ULCER, RIGHT (ICD-707.10) HYPERCHOLESTEROLEMIA (ICD-272.0) ANTIHYPERLIPIDEMIC USE, LONG TERM (ICD-V58.69) OSTEOARTHRITIS, LUMBAR SPINE (ICD-721.90) HYPERTENSION (ICD-401.9) DIABETES MELLITUS, TYPE I (ICD-250.01)  Review of Systems  The patient denies hypoglycemia.    Physical Exam  General:  obese.   Msk:  gait is slow but steady (she has  a cane but is not using it). Additional Exam:  Hemoglobin A1C       [H]  7.2 %    Impression & Recommendations:  Problem # 1:  DIABETES MELLITUS, TYPE I (ICD-250.01) this is the best control this pt should aim for, given her advanced age and variable cbg's  Medications Added to Medication List This Visit: 1)  Lisinopril 20 Mg Tabs (Lisinopril) .Marland Kitchen.. 1 in am  Other Orders: Est. Patient Level III (99213) TLB-A1C / Hgb A1C (Glycohemoglobin) (83036-A1C)  Patient Instructions: 1)  blood tests are being ordered for you today.  please call [phone] to hear your test results. 2)  pending the test results, please: 3)  continue nph insulin 70 units at night. 4)  increase humalog to three times a day (just before each meal) 40-30-45 units. 5)  Please schedule a follow-up appointment in 3 months. 6)  check your blood sugar 4  times a day--before the 3 meals, and at bedtime.  also check if you have symptoms of your blood sugar being too high or too low.  please keep a record of the readings and bring it to your next appointment here.  please call us sooner if you are having low blood sugar episodes.

## 2010-11-16 NOTE — Medication Information (Signed)
Summary: Diabetes Supplies/[facility]  Diabetes Supplies/[facility]   Imported By: Sherian Rein 09/06/2010 07:40:57  _____________________________________________________________________  External Attachment:    Type:   Image     Comment:   External Document

## 2010-11-16 NOTE — Assessment & Plan Note (Signed)
Summary: 2 WK FU  STC   Vital Signs:  Patient profile:   77 year old female Height:      63 inches (160.02 cm) Weight:      187.13 pounds (85.06 kg) O2 Sat:      96 % on Room air Temp:     96.8 degrees F (36 degrees C) oral Pulse rate:   112 / minute BP sitting:   138 / 72  (left arm) Cuff size:   large  Vitals Entered By: Josph Macho CMA (November 30, 2009 7:53 AM)  O2 Flow:  Room air CC: 2 week follow up/ CF Is Patient Diabetic? Yes   Primary Provider:  barnes  CC:  2 week follow up/ CF.  History of Present Illness: pt states she feels well in general.  she brings a record of her cbg's which i have reviewed today. it varies from 90-200.  it is in general highest in am, and lower at all other times of day.  Current Medications (verified): 1)  Chlordiazepoxide Hcl 5 Mg  Caps (Chlordiazepoxide Hcl) .... Take 1 By Mouth Two Times A Day Qd 2)  Prilosec 20 Mg  Cpdr (Omeprazole) .... Take 1 By Mouth Qd 3)  Reglan 10 Mg  Tabs (Metoclopramide Hcl) .... Take 1 By Mouth Qd 4)  Prinzide 20-25 Mg  Tabs (Lisinopril-Hydrochlorothiazide) .... Take 1 By Mouth Qd 5)  Lipitor 80 Mg  Tabs (Atorvastatin Calcium) .... Take 1 By Mouth Qd 6)  Pamelor 25 Mg  Caps (Nortriptyline Hcl) .... Take 1 By Mouth Qd 7)  Allegra 180 Mg  Tabs (Fexofenadine Hcl) .... Take 1 By Mouth Qd 8)  Flonase 50 Mcg/act  Susp (Fluticasone Propionate) .... Use 1 Spray Each Nostril Qhs 9)  Evista 60 Mg  Tabs (Raloxifene Hcl) .... Take 1 By Mouth Qd 10)  Toprol Xl 25 Mg  Tb24 (Metoprolol Succinate) .... Take 1 By Mouth Qd 11)  Humalog 100 Unit/ml Soln (Insulin Lispro (Human)) .... Inject Tid (Qac) 30-15-50 Units 12)  Humulin N 100 Unit/ml Susp (Insulin Isophane Human) .... Inject 65 Unit Subcutaneously Every Night 13)  Prozac 20 Mg  Caps (Fluoxetine Hcl) .... Take 1 By Mouth Qd 14)  Zetia 10 Mg  Tabs (Ezetimibe) .... Take 1 By Mouth Qd 15)  Ultilet Insulin Syringe Short 30g X 5/16" 1 Ml  Misc (Insulin Syringe-Needle  U-100) .... Use Qid 16)  Accu-Chek Comfort Curve   Strp (Glucose Blood) .... Use As Directed 17)  Accu-Chek Aviva  Strp (Glucose Blood) .... Check Blood Sugar Qid 18)  Bd Insulin Syringe Ultrafine 31g X 5/16" 1 Ml Misc (Insulin Syringe-Needle U-100) .... Use As Directed Qid 19)  Lantus .Marland Kitchen.. 25 Units At Bedtime 20)  Oxycodone-Acetaminophen 5-325 Mg Tabs (Oxycodone-Acetaminophen) .Marland Kitchen.. 1 or 2 Tabs Daily 21)  Lisinopril 20 Mg Tabs (Lisinopril) .... 2 in Am 22)  Furosemide 20 Mg Tabs (Furosemide) .... 2 in Am 23)  Gabapentin 400 Mg Caps (Gabapentin) .Marland Kitchen.. 1 Every 8 Hours  Allergies (verified): 1)  ! Demerol 2)  ! Codeine 3)  ! Voltaren 4)  ! Erythromycin 5)  ! Sulfa 6)  ! Ceclor 7)  ! Baclofen 8)  ! Darvocet 9)  ! * Promethazine  Past History:  Past Medical History: Last updated: 12/25/2007 Dyslipidemia  OSTEOARTHRITIS, LUMBAR SPINE (ICD-721.90) HYPERTENSION (ICD-401.9) DIABETES MELLITUS, TYPE I (ICD-250.01)  Review of Systems  The patient denies hypoglycemia.    Physical Exam  General:  obese.  no distress  Neck:  Supple without thyroid enlargement or tenderness. No cervical lymphadenopathy, neck masses or tracheal deviation.    Impression & Recommendations:  Problem # 1:  DIABETES MELLITUS, TYPE I (ICD-250.01) needs increased rx  Medications Added to Medication List This Visit: 1)  Humulin N 100 Unit/ml Susp (Insulin isophane human) .... Inject 75 unit subcutaneously every night  Other Orders: Est. Patient Level III (08657)  Patient Instructions: 1)  increase nph insulin to 75 units at night. 2)  continue humalog three times a day (just before each meal) 30-15-50 units. 3)  Please schedule a follow-up appointment in 2 weeks. 4)  check your blood sugar 4 times a day--before the 3 meals, and at bedtime.  also check if you have symptoms of your blood sugar being too high or too low.  please keep a record of the readings and bring it to your next appointment here.   please call us sooner if you are having low blood sugar episodes.

## 2010-11-16 NOTE — Medication Information (Signed)
[facility]   Imported By: Lester Zalma 08/12/2010 07:48:57  _____________________________________________________________________  External Attachment:    Type:   Image     Comment:   External Document

## 2010-11-16 NOTE — Consult Note (Signed)
Summary: [facility]  [facility]   Imported By: Sherian Rein 07/06/2010 07:27:39  _____________________________________________________________________  External Attachment:    Type:   Image     Comment:   External Document

## 2010-11-16 NOTE — Progress Notes (Signed)
  Phone Note Call from Patient Call back at Home Phone [phone]   Caller: Patient Reason for Call: Talk to Doctor Summary of Call: Pt left msg on vm having alot of sweating at night. want to know if she need to cut back her night insulin. Initial call taken by: Orlan Leavens RMA,  July 02, 2010 3:58 PM  Follow-up for Phone Call        called pt back she states her BS been running 112-118 in the am, and in the pm 169, but she is complaining of sweating at night pt states this is first time in past 2 months or so. ? reduce night time insulin. Pls advise Follow-up by: Orlan Leavens RMA,  July 02, 2010 4:05 PM  Additional Follow-up for Phone Call Additional follow up Details #1::        please check cbg when you have these sxs.  call if low. Additional Follow-up by: Minus Breeding MD,  July 02, 2010 4:07 PM    Additional Follow-up for Phone Call Additional follow up Details #2::    called pt informed of above information. Follow-up by: Zella Ball Ewing CMA Duncan Dull),  July 02, 2010 4:16 PM

## 2010-11-16 NOTE — Assessment & Plan Note (Signed)
Summary: 6 WK F/U #/ CD   Vital Signs:  Patient profile:   77 year old female Height:      63 inches Weight:      188 pounds BMI:     33.42 O2 Sat:      96 % on Room air Temp:     98.5 degrees F oral Pulse rate:   115 / minute BP sitting:   164 / 84  (left arm) Cuff size:   regular  Vitals Entered By: Margaret Pyle, CMA (April 02, 2010 1:00 PM)  O2 Flow:  Room air CC: 6 wk F/U - DM, Ulcer RT heel   Primary Provider:  a ross  CC:  6 wk F/U - DM and Ulcer RT heel.  History of Present Illness: pt states 3 weeks of moderate ulcer at the right heel, and associated pain.  she is unable to cite precip factor such as local injury.  she thinks it started with a blister rather than an injury.   no cbg record, but states cbg's are mid to high-100's.  it is highest before supper.  Current Medications (verified): 1)  Chlordiazepoxide Hcl 5 Mg  Caps (Chlordiazepoxide Hcl) .... Take 1 By Mouth Two Times A Day Qd 2)  Prilosec 20 Mg  Cpdr (Omeprazole) .... Take 1 By Mouth Qd 3)  Reglan 10 Mg  Tabs (Metoclopramide Hcl) .... Take 1 By Mouth Qd 4)  Prinzide 20-25 Mg  Tabs (Lisinopril-Hydrochlorothiazide) .... Take 1 By Mouth Qd 5)  Lipitor 80 Mg  Tabs (Atorvastatin Calcium) .... Take 1 By Mouth Qd 6)  Pamelor 25 Mg  Caps (Nortriptyline Hcl) .... Take 1 By Mouth Qd 7)  Allegra 180 Mg  Tabs (Fexofenadine Hcl) .... Take 1 By Mouth Qd 8)  Flonase 50 Mcg/act  Susp (Fluticasone Propionate) .... Use 1 Spray Each Nostril Qhs 9)  Evista 60 Mg  Tabs (Raloxifene Hcl) .... Take 1 By Mouth Qd 10)  Toprol Xl 25 Mg  Tb24 (Metoprolol Succinate) .... Take 1 By Mouth Qd 11)  Humalog 100 Unit/ml Soln (Insulin Lispro (Human)) .... Inject Tid (Qac) 40-20-45 Units 12)  Humulin N 100 Unit/ml Susp (Insulin Isophane Human) .... Inject 70 Unit Subcutaneously Every Night 13)  Prozac 20 Mg  Caps (Fluoxetine Hcl) .... Take 1 By Mouth Qd 14)  Zetia 10 Mg  Tabs (Ezetimibe) .... Take 1 By Mouth Qd 15)  Ultilet  Insulin Syringe Short 30g X 5/16" 1 Ml  Misc (Insulin Syringe-Needle U-100) .... Use Qid 16)  Accu-Chek Comfort Curve   Strp (Glucose Blood) .... Use As Directed 17)  Accu-Chek Aviva  Strp (Glucose Blood) .... Check Blood Sugar Qid 18)  Bd Insulin Syringe Ultrafine 31g X 5/16" 1 Ml Misc (Insulin Syringe-Needle U-100) .... Use As Directed Qid 19)  Oxycodone-Acetaminophen 5-325 Mg Tabs (Oxycodone-Acetaminophen) .Marland Kitchen.. 1 or 2 Tabs Daily 20)  Lisinopril 20 Mg Tabs (Lisinopril) .... 2 in Am 21)  Furosemide 20 Mg Tabs (Furosemide) .... 2 in Am 22)  Gabapentin 400 Mg Caps (Gabapentin) .Marland Kitchen.. 1 Every 8 Hours  Allergies (verified): 1)  ! Demerol 2)  ! Codeine 3)  ! Voltaren 4)  ! Erythromycin 5)  ! Sulfa 6)  ! Ceclor 7)  ! Baclofen 8)  ! Darvocet 9)  ! * Promethazine  Review of Systems  The patient denies fever.         denies numbness.  Physical Exam  General:  normal appearance.   Pulses:  dorsalis pedis intact bilat.  Extremities:  no deformity.  no ulcer on the feet.  feet are of normal color and temp.   1+ right pedal edema and 1+ left pedal edema.   at the posterior right heel, there is a 3 cm closed ulcer with eschar.  no drainage, but the ulcer is foul-smelling. ther shin on hte feet is very dry there are severely mycotic toenails.   Neurologic:  sensation is intact to touch on the feet, but decreased from normal.   Impression & Recommendations:  Problem # 1:  FOOT ULCER, RIGHT (ICD-707.10) Assessment New  Problem # 2:  DIABETES MELLITUS, TYPE I (ICD-250.01) needs increased rx  Medications Added to Medication List This Visit: 1)  Humalog 100 Unit/ml Soln (Insulin lispro (human)) .... Inject tid (qac) 40-30-45 units  Other Orders: Wound Care Center Referral (Wound Care) Est. Patient Level IV (95638)  Patient Instructions: 1)  continue nph insulin 70 units at night. 2)  increase humalog to three times a day (just before each meal) 40-30-45 units. 3)  Please schedule  a follow-up appointment in 2 months. 4)  check your blood sugar 4 times a day--before the 3 meals, and at bedtime.  also check if you have symptoms of your blood sugar being too high or too low.  please keep a record of the readings and bring it to your next appointment here.  please call us sooner if you are having low blood sugar episodes. 5)  refer to wound-care.  you will be called with a day and time for an appointment.

## 2010-11-16 NOTE — Assessment & Plan Note (Signed)
Summary: FOLLOW UP/AJ   Vital Signs:  Patient profile:   77 year old female Height:      63 inches (160.02 cm) Weight:      184.25 pounds (83.75 kg) BMI:     32.76 O2 Sat:      93 % on Room air Temp:     98.9 degrees F (37.17 degrees C) oral Pulse rate:   118 / minute BP sitting:   144 / 84  (left arm) Cuff size:   large  Vitals Entered By: Josph Macho CMA (November 16, 2009 8:13 AM)  O2 Flow:  Room air CC: Follow-up visit/ pt states her sugar was 300 this morning/ pt needs refills on meds/ pt states she is no longer taking Humulin/ CF Is Patient Diabetic? Yes   Primary Provider:  barnes  CC:  Follow-up visit/ pt states her sugar was 300 this morning/ pt needs refills on meds/ pt states she is no longer taking Humulin/ CF.  History of Present Illness: pt was hospitalized 3 mos ago with chf.  pt is long overdue for f/u.  she was switched to lantus during her hospitalization.  no cbg record, but states cbg's are sometimes low in the middle of the night.  it is much higher (300's) at other times of day.    Current Medications (verified): 1)  Chlordiazepoxide Hcl 5 Mg  Caps (Chlordiazepoxide Hcl) .... Take 1 By Mouth Two Times A Day Qd 2)  Prilosec 20 Mg  Cpdr (Omeprazole) .... Take 1 By Mouth Qd 3)  Reglan 10 Mg  Tabs (Metoclopramide Hcl) .... Take 1 By Mouth Qd 4)  Prinzide 20-25 Mg  Tabs (Lisinopril-Hydrochlorothiazide) .... Take 1 By Mouth Qd 5)  Lipitor 80 Mg  Tabs (Atorvastatin Calcium) .... Take 1 By Mouth Qd 6)  Pamelor 25 Mg  Caps (Nortriptyline Hcl) .... Take 1 By Mouth Qd 7)  Allegra 180 Mg  Tabs (Fexofenadine Hcl) .... Take 1 By Mouth Qd 8)  Flonase 50 Mcg/act  Susp (Fluticasone Propionate) .... Use 1 Spray Each Nostril Qhs 9)  Evista 60 Mg  Tabs (Raloxifene Hcl) .... Take 1 By Mouth Qd 10)  Toprol Xl 25 Mg  Tb24 (Metoprolol Succinate) .... Take 1 By Mouth Qd 11)  Humalog 100 Unit/ml Soln (Insulin Lispro (Human)) .... Inject Tid (Qac) 30-15-50 Units 12)  Humulin N  100 Unit/ml Susp (Insulin Isophane Human) .... Inject 65 Unit Subcutaneously Every Night 13)  Prozac 20 Mg  Caps (Fluoxetine Hcl) .... Take 1 By Mouth Qd 14)  Zetia 10 Mg  Tabs (Ezetimibe) .... Take 1 By Mouth Qd 15)  Ultilet Insulin Syringe Short 30g X 5/16" 1 Ml  Misc (Insulin Syringe-Needle U-100) .... Use Qid 16)  Accu-Chek Comfort Curve   Strp (Glucose Blood) .... Use As Directed 17)  Accu-Chek Aviva  Strp (Glucose Blood) .... Check Blood Sugar Qid 18)  Bd Insulin Syringe Ultrafine 31g X 5/16" 1 Ml Misc (Insulin Syringe-Needle U-100) .... Use As Directed Qid 19)  Lantus .Marland Kitchen.. 25 Units At Bedtime 20)  Oxycodone-Acetaminophen 5-325 Mg Tabs (Oxycodone-Acetaminophen) .Marland Kitchen.. 1 or 2 Tabs Daily 21)  Lisinopril 20 Mg Tabs (Lisinopril) .... 2 in Am 22)  Furosemide 20 Mg Tabs (Furosemide) .... 2 in Am 23)  Gabapentin 400 Mg Caps (Gabapentin) .Marland Kitchen.. 1 Every 8 Hours  Allergies (verified): 1)  ! Demerol 2)  ! Codeine 3)  ! Voltaren 4)  ! Erythromycin 5)  ! Sulfa 6)  ! Ceclor 7)  ! Baclofen 8)  !  Darvocet 9)  ! * Promethazine  Past History:  Past Medical History: Last updated: 12/25/2007 Dyslipidemia  OSTEOARTHRITIS, LUMBAR SPINE (ICD-721.90) HYPERTENSION (ICD-401.9) DIABETES MELLITUS, TYPE I (ICD-250.01)  Family History: Reviewed history and no changes required. no dm in her immediate family  Social History: Reviewed history and no changes required. separated 2010 lives alone  Review of Systems  The patient denies syncope.    Physical Exam  General:  obese.  no distress  Pulses:  dorsalis pedis intact bilat.   Extremities:  no deformity.  no ulcer on the feet.  feet are of normal color and temp.   1+ right pedal edema and 1+ left pedal edema.   Neurologic:  sensation is intact to touch on the feet, but decreased from normal. Additional Exam:  Hemoglobin A1C       [H]  8.1 %     Impression & Recommendations:  Problem # 1:  DIABETES MELLITUS, TYPE I (ICD-250.01) the  pattern of her cbg's says she needs much more mealtime insulin, and much less at night.    Medications Added to Medication List This Visit: 1)  Lantus  .Marland Kitchen.. 25 units at bedtime 2)  Oxycodone-acetaminophen 5-325 Mg Tabs (Oxycodone-acetaminophen) .Marland Kitchen.. 1 or 2 tabs daily 3)  Lisinopril 20 Mg Tabs (Lisinopril) .... 2 in am 4)  Furosemide 20 Mg Tabs (Furosemide) .... 2 in am 5)  Gabapentin 400 Mg Caps (Gabapentin) .Marland Kitchen.. 1 every 8 hours  Other Orders: TLB-A1C / Hgb A1C (Glycohemoglobin) (83036-A1C) Est. Patient Level III (10272)  Patient Instructions: 1)  stop lantus 2)  resume nph insulin 65 units at night. 3)  resume humalog three times a day (just before each meal) 30-15-50 units. 4)  i gave samples of "apidra," which is similar to humalog. 5)  Please schedule a follow-up appointment in 2 weeks. 6)  check your blood sugar 4 times a day--before the 3 meals, and at bedtime.  also check if you have symptoms of your blood sugar being too high or too low.  please keep a record of the readings and bring it to your next appointment here.  please call us sooner if you are having low blood sugar episodes. Prescriptions: ACCU-CHEK AVIVA  STRP (GLUCOSE BLOOD) CHECK BLOOD SUGAR QID  #100 x 6   Entered and Authorized by:   Minus Breeding MD   Signed by:   Minus Breeding MD on 11/16/2009   Method used:   Electronically to        [facility]* (retail)       [address]       [place], [place]  [address]       Ph: [phone]       Fax: [phone]   RxID:   [id number] ULTILET INSULIN SYRINGE SHORT 30G X 5/16" 1 ML  MISC (INSULIN SYRINGE-NEEDLE U-100) USE QID  #100 x 6   Entered and Authorized by:   Minus Breeding MD   Signed by:   Minus Breeding MD on 11/16/2009   Method used:   Electronically to        [facility]* (retail)       [address]       [place], [place]  [address]       Ph: [phone]       Fax: [phone]   RxID:   [id number] HUMULIN  N 100 UNIT/ML SUSP (INSULIN ISOPHANE HUMAN) Inject 65 unit subcutaneously every night  #2 vials x  11   Entered and Authorized by:   Minus Breeding MD   Signed by:   Minus Breeding MD on 11/16/2009   Method used:   Electronically to        [facility]* (retail)       [address]       [place], [place]  [address]       Ph: [phone]       Fax: [phone]   RxID:   [id number] HUMALOG 100 UNIT/ML SOLN (INSULIN LISPRO (HUMAN)) Inject tid (qac) 30-15-50 units  #3 vials x 11   Entered and Authorized by:   Minus Breeding MD   Signed by:   Minus Breeding MD on 11/16/2009   Method used:   Electronically to        [facility]* (retail)       [address]       [place], [place]  [id number]       Ph: [phone]       Fax: [phone]   RxID:   [id number]

## 2010-11-16 NOTE — Consult Note (Signed)
Summary: [facility]  [facility]   Imported By: Sherian Rein 06/10/2010 15:08:41  _____________________________________________________________________  External Attachment:    Type:   Image     Comment:   External Document

## 2010-11-16 NOTE — Assessment & Plan Note (Signed)
Summary: 1 MTH FU---STC   Vital Signs:  Patient profile:   77 year old female Height:      63 inches (160.02 cm) Weight:      193.38 pounds (87.90 kg) O2 Sat:      94 % on Room air Temp:     97.2 degrees F (36.22 degrees C) oral Pulse rate:   113 / minute BP sitting:   148 / 92  (left arm) Cuff size:   large  Vitals Entered By: Josph Macho RMA (January 13, 2010 9:20 AM)  O2 Flow:  Room air CC: 1 month follow up/ CF Is Patient Diabetic? Yes   Primary Provider:  a ross  CC:  1 month follow up/ CF.  History of Present Illness: pt states he feels well in general.  she brings a record of her cbg's which i have reviewed today.  it is in the 100's-200's, with no pattern throughout the day.  she had 1 episode of mild hypoglycemia, in am (64).    Current Medications (verified): 1)  Chlordiazepoxide Hcl 5 Mg  Caps (Chlordiazepoxide Hcl) .... Take 1 By Mouth Two Times A Day Qd 2)  Prilosec 20 Mg  Cpdr (Omeprazole) .... Take 1 By Mouth Qd 3)  Reglan 10 Mg  Tabs (Metoclopramide Hcl) .... Take 1 By Mouth Qd 4)  Prinzide 20-25 Mg  Tabs (Lisinopril-Hydrochlorothiazide) .... Take 1 By Mouth Qd 5)  Lipitor 80 Mg  Tabs (Atorvastatin Calcium) .... Take 1 By Mouth Qd 6)  Pamelor 25 Mg  Caps (Nortriptyline Hcl) .... Take 1 By Mouth Qd 7)  Allegra 180 Mg  Tabs (Fexofenadine Hcl) .... Take 1 By Mouth Qd 8)  Flonase 50 Mcg/act  Susp (Fluticasone Propionate) .... Use 1 Spray Each Nostril Qhs 9)  Evista 60 Mg  Tabs (Raloxifene Hcl) .... Take 1 By Mouth Qd 10)  Toprol Xl 25 Mg  Tb24 (Metoprolol Succinate) .... Take 1 By Mouth Qd 11)  Humalog 100 Unit/ml Soln (Insulin Lispro (Human)) .... Inject Tid (Qac) 35-15-40 Units 12)  Humulin N 100 Unit/ml Susp (Insulin Isophane Human) .... Inject 75 Unit Subcutaneously Every Night 13)  Prozac 20 Mg  Caps (Fluoxetine Hcl) .... Take 1 By Mouth Qd 14)  Zetia 10 Mg  Tabs (Ezetimibe) .... Take 1 By Mouth Qd 15)  Ultilet Insulin Syringe Short 30g X 5/16" 1 Ml  Misc  (Insulin Syringe-Needle U-100) .... Use Qid 16)  Accu-Chek Comfort Curve   Strp (Glucose Blood) .... Use As Directed 17)  Accu-Chek Aviva  Strp (Glucose Blood) .... Check Blood Sugar Qid 18)  Bd Insulin Syringe Ultrafine 31g X 5/16" 1 Ml Misc (Insulin Syringe-Needle U-100) .... Use As Directed Qid 19)  Oxycodone-Acetaminophen 5-325 Mg Tabs (Oxycodone-Acetaminophen) .Marland Kitchen.. 1 or 2 Tabs Daily 20)  Lisinopril 20 Mg Tabs (Lisinopril) .... 2 in Am 21)  Furosemide 20 Mg Tabs (Furosemide) .... 2 in Am 22)  Gabapentin 400 Mg Caps (Gabapentin) .Marland Kitchen.. 1 Every 8 Hours  Allergies (verified): 1)  ! Demerol 2)  ! Codeine 3)  ! Voltaren 4)  ! Erythromycin 5)  ! Sulfa 6)  ! Ceclor 7)  ! Baclofen 8)  ! Darvocet 9)  ! * Promethazine  Past History:  Past Medical History: Last updated: 12/25/2007 Dyslipidemia  OSTEOARTHRITIS, LUMBAR SPINE (ICD-721.90) HYPERTENSION (ICD-401.9) DIABETES MELLITUS, TYPE I (ICD-250.01)  Review of Systems  The patient denies syncope.    Physical Exam  General:  normal appearance.   Psych:  Alert and cooperative; normal  mood and affect; normal attention span and concentration.     Impression & Recommendations:  Problem # 1:  DIABETES MELLITUS, TYPE I (ICD-250.01) ? overcontrolled  Medications Added to Medication List This Visit: 1)  Humulin N 100 Unit/ml Susp (Insulin isophane human) .... Inject 70 unit subcutaneously every night  Other Orders: Est. Patient Level III ([address]109)  Patient Instructions: 1)  reduce nph insulin to 70 units at night. 2)  continue humalog three times a day (just before each meal) 35-15-40 units. 3)  Please schedule a follow-up appointment in 58month. 4)  check your blood sugar 4 times a day--before the 3 meals, and at bedtime.  also check if you have symptoms of your blood sugar being too high or too low.  please keep a record of the readings and bring it to your next appointment here.  please call us sooner if you are having low blood  sugar episodes.

## 2010-11-18 NOTE — Letter (Signed)
Summary: [facility]  [facility]   Imported By: Lester  10/05/2010 09:27:30  _____________________________________________________________________  External Attachment:    Type:   Image     Comment:   External Document

## 2010-11-18 NOTE — Letter (Signed)
Summary: Physician Statement/[facility]  Physician Statement/[facility]   Imported By: Lester [place] 10/05/2010 09:29:38  _____________________________________________________________________  External Attachment:    Type:   Image     Comment:   External Document

## 2010-12-30 ENCOUNTER — Other Ambulatory Visit: Payer: Self-pay | Admitting: Family Medicine

## 2010-12-30 ENCOUNTER — Ambulatory Visit
Admission: RE | Admit: 2010-12-30 | Discharge: 2010-12-30 | Disposition: A | Discharge status: Home / Self Care | Payer: Medicare HMO | Source: Ambulatory Visit | Attending: Family Medicine | Admitting: Family Medicine

## 2010-12-30 DIAGNOSIS — R609 Edema, unspecified: Secondary | ICD-10-CM

## 2010-12-30 DIAGNOSIS — M79606 Pain in leg, unspecified: Secondary | ICD-10-CM

## 2011-01-03 ENCOUNTER — Other Ambulatory Visit: Payer: Medicare HMO

## 2011-01-03 ENCOUNTER — Ambulatory Visit: Payer: Self-pay | Admitting: Endocrinology

## 2011-01-03 ENCOUNTER — Other Ambulatory Visit: Payer: Self-pay | Admitting: Endocrinology

## 2011-01-03 ENCOUNTER — Encounter: Payer: Self-pay | Admitting: Endocrinology

## 2011-01-03 ENCOUNTER — Ambulatory Visit (INDEPENDENT_AMBULATORY_CARE_PROVIDER_SITE_OTHER): Payer: Medicare HMO | Admitting: Endocrinology

## 2011-01-03 DIAGNOSIS — E109 Type 1 diabetes mellitus without complications: Secondary | ICD-10-CM

## 2011-01-03 LAB — HEMOGLOBIN A1C: Hgb A1c MFr Bld: 7 % — ABNORMAL HIGH (ref 4.6–6.5)

## 2011-01-13 NOTE — Assessment & Plan Note (Signed)
Summary: 4 month follow up/ lb--pt rs'd/cd   Vital Signs:  Patient profile:   77 year old female Menstrual status:  hysterectomy Height:      63 inches (160.02 cm) Weight:      192.13 pounds (87.33 kg) BMI:     34.16 O2 Sat:      95 % on Room air Temp:     98.8 degrees F (37.11 degrees C) oral Pulse rate:   112 / minute BP sitting:   142 / 78  (left arm) Cuff size:   large  Vitals Entered By: Brenton Grills CMA Duncan Dull) (January 03, 2011 9:46 AM)  O2 Flow:  Room air CC: 4 month F/U/aj Is Patient Diabetic? Yes Comments Pt is due for mammogram, pt has had Pneumovax but is unsure of year, pt has never had Zostavax     Menstrual Status hysterectomy   Primary Provider:  a ross  CC:  4 month F/U/aj.  History of Present Illness: no cbg record, but states there is no trend throughout the day.  most cbg's are in the low to mid-100's.  pt states she feels well in general, except for a recent right knee bruise.    Current Medications (verified): 1)  Chlordiazepoxide Hcl 5 Mg  Caps (Chlordiazepoxide Hcl) .... Take 1 By Mouth Two Times A Day Qd 2)  Prilosec 20 Mg  Cpdr (Omeprazole) .... Take 1 By Mouth Qd 3)  Reglan 10 Mg  Tabs (Metoclopramide Hcl) .... Take 1 By Mouth Qd 4)  Prinzide 20-25 Mg  Tabs (Lisinopril-Hydrochlorothiazide) .... Take 1 By Mouth Qd 5)  Crestor 20 Mg Tabs (Rosuvastatin Calcium) .Marland Kitchen.. 1 Tablet By Mouth At Bedtime 6)  Pamelor 25 Mg  Caps (Nortriptyline Hcl) .... Take 1 By Mouth Qd 7)  Allegra 180 Mg  Tabs (Fexofenadine Hcl) .... Take 1 By Mouth Qd 8)  Flonase 50 Mcg/act  Susp (Fluticasone Propionate) .... Use 1 Spray Each Nostril Qhs 9)  Evista 60 Mg  Tabs (Raloxifene Hcl) .... Take 1 By Mouth Qd 10)  Toprol Xl 25 Mg  Tb24 (Metoprolol Succinate) .... Take 1 By Mouth Qd 11)  Humalog 100 Unit/ml Soln (Insulin Lispro (Human)) .... Inject Tid (Qac) 40-35-45 Units 12)  Humulin N 100 Unit/ml Susp (Insulin Isophane Human) .... Inject 60 Unit Subcutaneously Every Night 13)   Prozac 20 Mg  Caps (Fluoxetine Hcl) .... Take 1 By Mouth Qd 14)  Ultilet Insulin Syringe Short 30g X 5/16" 1 Ml  Misc (Insulin Syringe-Needle U-100) .... Use Qid 15)  Accu-Chek Comfort Curve   Strp (Glucose Blood) .... Use As Directed 16)  Accu-Chek Aviva  Strp (Glucose Blood) .... Check Blood Sugar Qid 17)  Bd Insulin Syringe Ultrafine 31g X 5/16" 1 Ml Misc (Insulin Syringe-Needle U-100) .... Use As Directed Qid 18)  Oxycodone-Acetaminophen 5-325 Mg Tabs (Oxycodone-Acetaminophen) .Marland Kitchen.. 1 or 2 Tabs Daily 19)  Lisinopril 20 Mg Tabs (Lisinopril) .Marland Kitchen.. 1 in Am 20)  Furosemide 20 Mg Tabs (Furosemide) .... 2 in Am 21)  Gabapentin 400 Mg Caps (Gabapentin) .Marland Kitchen.. 1 Every 8 Hours  Allergies (verified): 1)  ! Demerol 2)  ! Codeine 3)  ! Voltaren 4)  ! Erythromycin 5)  ! Sulfa 6)  ! Ceclor 7)  ! Baclofen 8)  ! Darvocet 9)  ! * Promethazine  Past History:  Past Medical History: Last updated: 06/11/2010 FOOT ULCER, RIGHT (ICD-707.10) HYPERCHOLESTEROLEMIA (ICD-272.0) ANTIHYPERLIPIDEMIC USE, LONG TERM (ICD-V58.69) OSTEOARTHRITIS, LUMBAR SPINE (ICD-721.90) HYPERTENSION (ICD-401.9) DIABETES MELLITUS, TYPE I (ICD-250.01)  Review  of Systems  The patient denies hypoglycemia.    Physical Exam  General:  normal appearance.   Pulses:  dorsalis pedis intact bilat.  no carotid bruit Extremities:  no deformity.  no ulcer on the feet.  feet are of normal temp.   the right leg is slightly red, but not warm. the skin on the feet is very dry. there are bilat varicosities 1+ right pedal edema, trace left pedal dema mycotic toenails.   Neurologic:  sensation is intact to touch on the feet. Additional Exam:  Hemoglobin A1C       [H]  7.0 %    Impression & Recommendations:  Problem # 1:  DIABETES MELLITUS, TYPE I (ICD-250.01) well-controlled  Medications Added to Medication List This Visit: 1)  Crestor 20 Mg Tabs (Rosuvastatin calcium) .Marland Kitchen.. 1 tablet by mouth at bedtime 2)  Humalog 100 Unit/ml  Soln (Insulin lispro (human)) .... Inject tid (qac) 45-35-45 units  Other Orders: TLB-A1C / Hgb A1C (Glycohemoglobin) (83036-A1C) Est. Patient Level III (10272)  Patient Instructions: 1)  blood tests are being ordered for you today.  please call [phone] to hear your test results. 2)  pending the test results, please: 3)  continue nph insulin 60 units at night. 4)  continue humalog three times a day (just before each meal) 45-30-45 units. 5)  Please schedule a follow-up appointment in 4 months. 6)  check your blood sugar 4 times a day--before the 3 meals, and at bedtime.  also check if you have symptoms of your blood sugar being too high or too low.  please keep a record of the readings and bring it to your next appointment here.  please call us sooner if you are having low blood sugar episodes. 7)  (update: i left message on phone-tree:  rx as we discussed) Prescriptions: HUMALOG 100 UNIT/ML SOLN (INSULIN LISPRO (HUMAN)) Inject tid (qac) 45-35-45 units  #5 vials x 11   Entered and Authorized by:   Minus Breeding MD   Signed by:   Minus Breeding MD on 01/03/2011   Method used:   Electronically to        [facility]* (retail)       [address]       [place], [place]  [id number]       Ph: [phone]       Fax: [phone]   RxID:   [id number]    Orders Added: 1)  TLB-A1C / Hgb A1C (Glycohemoglobin) [83036-A1C] 2)  Est. Patient Level III [20254]   Immunization History:  Influenza Immunization History:    Influenza:  historical (06/17/2010)   Immunization History:  Influenza Immunization History:    Influenza:  Historical (06/17/2010)

## 2011-01-20 LAB — URINALYSIS, ROUTINE W REFLEX MICROSCOPIC
Hgb urine dipstick: NEGATIVE
Ketones, ur: NEGATIVE mg/dL
Protein, ur: NEGATIVE mg/dL
Urobilinogen, UA: 0.2 mg/dL (ref 0.0–1.0)

## 2011-01-20 LAB — DIFFERENTIAL
Basophils Absolute: 0 10*3/uL (ref 0.0–0.1)
Lymphocytes Relative: 10 % — ABNORMAL LOW (ref 12–46)
Lymphs Abs: 0.6 10*3/uL — ABNORMAL LOW (ref 0.7–4.0)
Neutro Abs: 5 10*3/uL (ref 1.7–7.7)

## 2011-01-20 LAB — CBC
MCHC: 32.6 g/dL (ref 30.0–36.0)
MCV: 80.4 fL (ref 78.0–100.0)
MCV: 80.9 fL (ref 78.0–100.0)
Platelets: 219 10*3/uL (ref 150–400)
Platelets: 194 10*3/uL (ref 150–400)
Platelets: 209 10*3/uL (ref 150–400)
RBC: 3.72 MIL/uL — ABNORMAL LOW (ref 3.87–5.11)
RDW: 17 % — ABNORMAL HIGH (ref 11.5–15.5)
RDW: 17.5 % — ABNORMAL HIGH (ref 11.5–15.5)
RDW: 17.9 % — ABNORMAL HIGH (ref 11.5–15.5)
WBC: 5.4 10*3/uL (ref 4.0–10.5)
WBC: 4.5 10*3/uL (ref 4.0–10.5)
WBC: 6.3 10*3/uL (ref 4.0–10.5)

## 2011-01-20 LAB — POCT I-STAT, CHEM 8
BUN: 4 mg/dL — ABNORMAL LOW (ref 6–23)
Chloride: 105 mEq/L (ref 96–112)
Potassium: 3.2 mEq/L — ABNORMAL LOW (ref 3.5–5.1)
Sodium: 141 mEq/L (ref 135–145)

## 2011-01-20 LAB — BLOOD GAS, ARTERIAL
FIO2: 0.21 %
Patient temperature: 98.6
pH, Arterial: 7.457 — ABNORMAL HIGH (ref 7.350–7.400)

## 2011-01-20 LAB — VITAMIN B12: Vitamin B-12: 206 pg/mL — ABNORMAL LOW (ref 211–911)

## 2011-01-20 LAB — TROPONIN I: Troponin I: 0.02 ng/mL (ref 0.00–0.06)

## 2011-01-20 LAB — URINE MICROSCOPIC-ADD ON

## 2011-01-20 LAB — GLUCOSE, CAPILLARY
Glucose-Capillary: 163 mg/dL — ABNORMAL HIGH (ref 70–99)
Glucose-Capillary: 135 mg/dL — ABNORMAL HIGH (ref 70–99)
Glucose-Capillary: 143 mg/dL — ABNORMAL HIGH (ref 70–99)
Glucose-Capillary: 143 mg/dL — ABNORMAL HIGH (ref 70–99)
Glucose-Capillary: 147 mg/dL — ABNORMAL HIGH (ref 70–99)
Glucose-Capillary: 163 mg/dL — ABNORMAL HIGH (ref 70–99)
Glucose-Capillary: 174 mg/dL — ABNORMAL HIGH (ref 70–99)

## 2011-01-20 LAB — BASIC METABOLIC PANEL
BUN: 6 mg/dL (ref 6–23)
BUN: 3 mg/dL — ABNORMAL LOW (ref 6–23)
BUN: 3 mg/dL — ABNORMAL LOW (ref 6–23)
CO2: 27 mEq/L (ref 19–32)
Chloride: 100 mEq/L (ref 96–112)
Chloride: 107 mEq/L (ref 96–112)
Creatinine, Ser: 0.84 mg/dL (ref 0.4–1.2)
Creatinine, Ser: 0.69 mg/dL (ref 0.4–1.2)
GFR calc non Af Amer: >60 mL/min (ref >60)
Potassium: 3.2 mEq/L — ABNORMAL LOW (ref 3.5–5.1)

## 2011-01-20 LAB — BRAIN NATRIURETIC PEPTIDE: Pro B Natriuretic peptide (BNP): 549 pg/mL — ABNORMAL HIGH (ref 0.0–100.0)

## 2011-01-20 LAB — HEMOGLOBIN A1C
Hgb A1c MFr Bld: 7.3 % — ABNORMAL HIGH (ref 4.6–6.1)
Mean Plasma Glucose: 163 mg/dL

## 2011-01-20 LAB — LIPID PANEL
LDL Cholesterol: 48 mg/dL (ref 0–99)
VLDL: 16 mg/dL (ref 0–40)

## 2011-01-20 LAB — CARDIAC PANEL(CRET KIN+CKTOT+MB+TROPI)
Relative Index: 2.1 (ref 0.0–2.5)
Relative Index: 2.2 (ref 0.0–2.5)
Troponin I: 0.03 ng/mL (ref 0.00–0.06)

## 2011-01-20 LAB — CK TOTAL AND CKMB (NOT AT ARMC)
CK, MB: 3.7 ng/mL (ref 0.3–4.0)
Relative Index: 2 (ref 0.0–2.5)
Total CK: 187 U/L — ABNORMAL HIGH (ref 7–177)

## 2011-01-20 LAB — MAGNESIUM: Magnesium: 1.8 mg/dL (ref 1.5–2.5)

## 2011-01-21 LAB — GLUCOSE, CAPILLARY
Glucose-Capillary: 192 mg/dL — ABNORMAL HIGH (ref 70–99)
Glucose-Capillary: 195 mg/dL — ABNORMAL HIGH (ref 70–99)
Glucose-Capillary: 198 mg/dL — ABNORMAL HIGH (ref 70–99)
Glucose-Capillary: 219 mg/dL — ABNORMAL HIGH (ref 70–99)
Glucose-Capillary: 228 mg/dL — ABNORMAL HIGH (ref 70–99)
Glucose-Capillary: 232 mg/dL — ABNORMAL HIGH (ref 70–99)
Glucose-Capillary: 232 mg/dL — ABNORMAL HIGH (ref 70–99)
Glucose-Capillary: 241 mg/dL — ABNORMAL HIGH (ref 70–99)
Glucose-Capillary: 243 mg/dL — ABNORMAL HIGH (ref 70–99)
Glucose-Capillary: 248 mg/dL — ABNORMAL HIGH (ref 70–99)
Glucose-Capillary: 249 mg/dL — ABNORMAL HIGH (ref 70–99)
Glucose-Capillary: 295 mg/dL — ABNORMAL HIGH (ref 70–99)
Glucose-Capillary: 191 mg/dL — ABNORMAL HIGH (ref 70–99)
Glucose-Capillary: 194 mg/dL — ABNORMAL HIGH (ref 70–99)
Glucose-Capillary: 198 mg/dL — ABNORMAL HIGH (ref 70–99)
Glucose-Capillary: 218 mg/dL — ABNORMAL HIGH (ref 70–99)
Glucose-Capillary: 238 mg/dL — ABNORMAL HIGH (ref 70–99)
Glucose-Capillary: 241 mg/dL — ABNORMAL HIGH (ref 70–99)
Glucose-Capillary: 250 mg/dL — ABNORMAL HIGH (ref 70–99)
Glucose-Capillary: 295 mg/dL — ABNORMAL HIGH (ref 70–99)

## 2011-01-21 LAB — DIFFERENTIAL
Basophils Relative: 0 % (ref 0–1)
Basophils Relative: 0 % (ref 0–1)
Eosinophils Absolute: 0 10*3/uL (ref 0.0–0.7)
Eosinophils Absolute: 0.1 10*3/uL (ref 0.0–0.7)
Eosinophils Relative: 1 % (ref 0–5)
Monocytes Absolute: 0.6 10*3/uL (ref 0.1–1.0)
Monocytes Absolute: 0.8 10*3/uL (ref 0.1–1.0)
Monocytes Relative: 10 % (ref 3–12)
Monocytes Relative: 11 % (ref 3–12)
Neutrophils Relative %: 69 % (ref 43–77)
Neutrophils Relative %: 72 % (ref 43–77)

## 2011-01-21 LAB — BASIC METABOLIC PANEL
BUN: 11 mg/dL (ref 6–23)
BUN: 12 mg/dL (ref 6–23)
BUN: 11 mg/dL (ref 6–23)
BUN: 14 mg/dL (ref 6–23)
BUN: 9 mg/dL (ref 6–23)
CO2: 25 mEq/L (ref 19–32)
CO2: 23 mEq/L (ref 19–32)
CO2: 27 mEq/L (ref 19–32)
CO2: 28 mEq/L (ref 19–32)
CO2: 28 mEq/L (ref 19–32)
Calcium: 8.8 mg/dL (ref 8.4–10.5)
Calcium: 9.1 mg/dL (ref 8.4–10.5)
Chloride: 96 mEq/L (ref 96–112)
Chloride: 97 mEq/L (ref 96–112)
Chloride: 102 mEq/L (ref 96–112)
Chloride: 105 mEq/L (ref 96–112)
Creatinine, Ser: 0.8 mg/dL (ref 0.4–1.2)
Creatinine, Ser: 0.75 mg/dL (ref 0.4–1.2)
Creatinine, Ser: 0.78 mg/dL (ref 0.4–1.2)
Creatinine, Ser: 0.78 mg/dL (ref 0.4–1.2)
Creatinine, Ser: 0.85 mg/dL (ref 0.4–1.2)
GFR calc Af Amer: >60 mL/min (ref >60)
GFR calc Af Amer: >60 mL/min (ref >60)
GFR calc non Af Amer: >60 mL/min (ref >60)
GFR calc non Af Amer: >60 mL/min (ref >60)
GFR calc non Af Amer: >60 mL/min (ref >60)
GFR calc non Af Amer: >60 mL/min (ref >60)
Glucose, Bld: 247 mg/dL — ABNORMAL HIGH (ref 70–99)
Glucose, Bld: 295 mg/dL — ABNORMAL HIGH (ref 70–99)
Glucose, Bld: 196 mg/dL — ABNORMAL HIGH (ref 70–99)
Glucose, Bld: 212 mg/dL — ABNORMAL HIGH (ref 70–99)
Glucose, Bld: 85 mg/dL (ref 70–99)
Potassium: 3.8 mEq/L (ref 3.5–5.1)
Potassium: 4.2 mEq/L (ref 3.5–5.1)
Potassium: 4.2 mEq/L (ref 3.5–5.1)
Sodium: 132 mEq/L — ABNORMAL LOW (ref 135–145)
Sodium: 133 mEq/L — ABNORMAL LOW (ref 135–145)

## 2011-01-21 LAB — URINALYSIS, ROUTINE W REFLEX MICROSCOPIC
Glucose, UA: 100 mg/dL — AB
Glucose, UA: NEGATIVE mg/dL
Hgb urine dipstick: NEGATIVE
Ketones, ur: NEGATIVE mg/dL
Nitrite: NEGATIVE
Protein, ur: NEGATIVE mg/dL
pH: 6 (ref 5.0–8.0)

## 2011-01-21 LAB — CBC
HCT: 36.9 % (ref 36.0–46.0)
HCT: 35.6 % — ABNORMAL LOW (ref 36.0–46.0)
Hemoglobin: 10.7 g/dL — ABNORMAL LOW (ref 12.0–15.0)
Hemoglobin: 10.9 g/dL — ABNORMAL LOW (ref 12.0–15.0)
Hemoglobin: 11.3 g/dL — ABNORMAL LOW (ref 12.0–15.0)
Hemoglobin: 11.3 g/dL — ABNORMAL LOW (ref 12.0–15.0)
MCHC: 31.9 g/dL (ref 30.0–36.0)
MCHC: 32.5 g/dL (ref 30.0–36.0)
MCHC: 31.9 g/dL (ref 30.0–36.0)
MCHC: 32.3 g/dL (ref 30.0–36.0)
MCHC: 32.5 g/dL (ref 30.0–36.0)
MCHC: 32.6 g/dL (ref 30.0–36.0)
MCV: 81.7 fL (ref 78.0–100.0)
MCV: 81.8 fL (ref 78.0–100.0)
MCV: 82.2 fL (ref 78.0–100.0)
Platelets: 210 10*3/uL (ref 150–400)
Platelets: 215 10*3/uL (ref 150–400)
Platelets: 221 10*3/uL (ref 150–400)
RBC: 4.26 MIL/uL (ref 3.87–5.11)
RBC: 4.01 MIL/uL (ref 3.87–5.11)
RBC: 4.1 MIL/uL (ref 3.87–5.11)
RDW: 16.4 % — ABNORMAL HIGH (ref 11.5–15.5)
RDW: 16 % — ABNORMAL HIGH (ref 11.5–15.5)
RDW: 16.3 % — ABNORMAL HIGH (ref 11.5–15.5)
RDW: 16.3 % — ABNORMAL HIGH (ref 11.5–15.5)
WBC: 5.4 10*3/uL (ref 4.0–10.5)

## 2011-01-21 LAB — URINE CULTURE
Colony Count: NO GROWTH
Culture: NO GROWTH

## 2011-01-21 LAB — TSH: TSH: 0.687 u[IU]/mL (ref 0.350–4.500)

## 2011-01-21 LAB — LIPID PANEL
Total CHOL/HDL Ratio: 4.3 RATIO
VLDL: 32 mg/dL (ref 0–40)

## 2011-01-21 LAB — HEMOGLOBIN A1C: Mean Plasma Glucose: 154 mg/dL

## 2011-01-21 LAB — MAGNESIUM: Magnesium: 2.1 mg/dL (ref 1.5–2.5)

## 2011-01-23 LAB — CBC
HCT: 37.8 % (ref 36.0–46.0)
Hemoglobin: 11 g/dL — ABNORMAL LOW (ref 12.0–15.0)
Hemoglobin: 12.5 g/dL (ref 12.0–15.0)
MCHC: 32.8 g/dL (ref 30.0–36.0)
MCV: 91.3 fL (ref 78.0–100.0)
Platelets: 193 10*3/uL (ref 150–400)
RBC: 3.65 MIL/uL — ABNORMAL LOW (ref 3.87–5.11)
RDW: 14.1 % (ref 11.5–15.5)

## 2011-01-23 LAB — PROTIME-INR
INR: 1 (ref 0.00–1.49)
Prothrombin Time: 13.4 seconds (ref 11.6–15.2)

## 2011-01-23 LAB — DIFFERENTIAL
Basophils Absolute: 0 10*3/uL (ref 0.0–0.1)
Basophils Absolute: 0 10*3/uL (ref 0.0–0.1)
Basophils Relative: 0 % (ref 0–1)
Basophils Relative: 1 % (ref 0–1)
Lymphocytes Relative: 19 % (ref 12–46)
Lymphocytes Relative: 21 % (ref 12–46)
Monocytes Absolute: 0.5 10*3/uL (ref 0.1–1.0)
Monocytes Absolute: 0.7 10*3/uL (ref 0.1–1.0)
Monocytes Relative: 11 % (ref 3–12)
Neutro Abs: 3.5 10*3/uL (ref 1.7–7.7)
Neutro Abs: 4.2 10*3/uL (ref 1.7–7.7)
Neutrophils Relative %: 66 % (ref 43–77)
Neutrophils Relative %: 69 % (ref 43–77)

## 2011-01-23 LAB — URINALYSIS, ROUTINE W REFLEX MICROSCOPIC
Ketones, ur: NEGATIVE mg/dL
Nitrite: NEGATIVE
Nitrite: NEGATIVE
Protein, ur: NEGATIVE mg/dL
Specific Gravity, Urine: 1.014 (ref 1.005–1.030)
Urobilinogen, UA: 1 mg/dL (ref 0.0–1.0)
Urobilinogen, UA: 1 mg/dL (ref 0.0–1.0)
pH: 6 (ref 5.0–8.0)
pH: 7.5 (ref 5.0–8.0)

## 2011-01-23 LAB — BASIC METABOLIC PANEL
CO2: 29 mEq/L (ref 19–32)
Calcium: 8.7 mg/dL (ref 8.4–10.5)
Creatinine, Ser: 0.77 mg/dL (ref 0.4–1.2)
GFR calc Af Amer: >60 mL/min (ref >60)
GFR calc non Af Amer: >60 mL/min (ref >60)
Sodium: 142 mEq/L (ref 135–145)

## 2011-01-23 LAB — TYPE AND SCREEN

## 2011-01-23 LAB — GLUCOSE, CAPILLARY
Glucose-Capillary: 132 mg/dL — ABNORMAL HIGH (ref 70–99)
Glucose-Capillary: 143 mg/dL — ABNORMAL HIGH (ref 70–99)
Glucose-Capillary: 167 mg/dL — ABNORMAL HIGH (ref 70–99)
Glucose-Capillary: 174 mg/dL — ABNORMAL HIGH (ref 70–99)
Glucose-Capillary: 186 mg/dL — ABNORMAL HIGH (ref 70–99)
Glucose-Capillary: 189 mg/dL — ABNORMAL HIGH (ref 70–99)
Glucose-Capillary: 194 mg/dL — ABNORMAL HIGH (ref 70–99)
Glucose-Capillary: 201 mg/dL — ABNORMAL HIGH (ref 70–99)
Glucose-Capillary: 203 mg/dL — ABNORMAL HIGH (ref 70–99)
Glucose-Capillary: 204 mg/dL — ABNORMAL HIGH (ref 70–99)
Glucose-Capillary: 209 mg/dL — ABNORMAL HIGH (ref 70–99)
Glucose-Capillary: 229 mg/dL — ABNORMAL HIGH (ref 70–99)
Glucose-Capillary: 244 mg/dL — ABNORMAL HIGH (ref 70–99)
Glucose-Capillary: 266 mg/dL — ABNORMAL HIGH (ref 70–99)
Glucose-Capillary: 323 mg/dL — ABNORMAL HIGH (ref 70–99)
Glucose-Capillary: 153 mg/dL — ABNORMAL HIGH (ref 70–99)
Glucose-Capillary: 163 mg/dL — ABNORMAL HIGH (ref 70–99)
Glucose-Capillary: 170 mg/dL — ABNORMAL HIGH (ref 70–99)
Glucose-Capillary: 176 mg/dL — ABNORMAL HIGH (ref 70–99)
Glucose-Capillary: 183 mg/dL — ABNORMAL HIGH (ref 70–99)
Glucose-Capillary: 194 mg/dL — ABNORMAL HIGH (ref 70–99)
Glucose-Capillary: 197 mg/dL — ABNORMAL HIGH (ref 70–99)
Glucose-Capillary: 204 mg/dL — ABNORMAL HIGH (ref 70–99)
Glucose-Capillary: 218 mg/dL — ABNORMAL HIGH (ref 70–99)
Glucose-Capillary: 218 mg/dL — ABNORMAL HIGH (ref 70–99)
Glucose-Capillary: 220 mg/dL — ABNORMAL HIGH (ref 70–99)
Glucose-Capillary: 228 mg/dL — ABNORMAL HIGH (ref 70–99)
Glucose-Capillary: 257 mg/dL — ABNORMAL HIGH (ref 70–99)

## 2011-01-23 LAB — COMPREHENSIVE METABOLIC PANEL
Albumin: 3.4 g/dL — ABNORMAL LOW (ref 3.5–5.2)
Alkaline Phosphatase: 86 U/L (ref 39–117)
BUN: 9 mg/dL (ref 6–23)
Chloride: 103 mEq/L (ref 96–112)
Creatinine, Ser: 0.74 mg/dL (ref 0.4–1.2)
Glucose, Bld: 159 mg/dL — ABNORMAL HIGH (ref 70–99)
Total Bilirubin: 0.6 mg/dL (ref 0.3–1.2)

## 2011-01-23 LAB — APTT: aPTT: 25 seconds (ref 24–37)

## 2011-01-23 LAB — URINE CULTURE

## 2011-01-23 LAB — URINE MICROSCOPIC-ADD ON

## 2011-01-24 LAB — GLUCOSE, CAPILLARY
Glucose-Capillary: 101 mg/dL — ABNORMAL HIGH (ref 70–99)
Glucose-Capillary: 104 mg/dL — ABNORMAL HIGH (ref 70–99)
Glucose-Capillary: 107 mg/dL — ABNORMAL HIGH (ref 70–99)
Glucose-Capillary: 112 mg/dL — ABNORMAL HIGH (ref 70–99)
Glucose-Capillary: 116 mg/dL — ABNORMAL HIGH (ref 70–99)
Glucose-Capillary: 117 mg/dL — ABNORMAL HIGH (ref 70–99)
Glucose-Capillary: 117 mg/dL — ABNORMAL HIGH (ref 70–99)
Glucose-Capillary: 131 mg/dL — ABNORMAL HIGH (ref 70–99)
Glucose-Capillary: 143 mg/dL — ABNORMAL HIGH (ref 70–99)
Glucose-Capillary: 146 mg/dL — ABNORMAL HIGH (ref 70–99)
Glucose-Capillary: 154 mg/dL — ABNORMAL HIGH (ref 70–99)
Glucose-Capillary: 163 mg/dL — ABNORMAL HIGH (ref 70–99)
Glucose-Capillary: 170 mg/dL — ABNORMAL HIGH (ref 70–99)
Glucose-Capillary: 175 mg/dL — ABNORMAL HIGH (ref 70–99)
Glucose-Capillary: 176 mg/dL — ABNORMAL HIGH (ref 70–99)
Glucose-Capillary: 214 mg/dL — ABNORMAL HIGH (ref 70–99)
Glucose-Capillary: 251 mg/dL — ABNORMAL HIGH (ref 70–99)
Glucose-Capillary: 69 mg/dL — ABNORMAL LOW (ref 70–99)
Glucose-Capillary: 120 mg/dL — ABNORMAL HIGH (ref 70–99)
Glucose-Capillary: 121 mg/dL — ABNORMAL HIGH (ref 70–99)
Glucose-Capillary: 129 mg/dL — ABNORMAL HIGH (ref 70–99)
Glucose-Capillary: 147 mg/dL — ABNORMAL HIGH (ref 70–99)
Glucose-Capillary: 151 mg/dL — ABNORMAL HIGH (ref 70–99)
Glucose-Capillary: 152 mg/dL — ABNORMAL HIGH (ref 70–99)
Glucose-Capillary: 153 mg/dL — ABNORMAL HIGH (ref 70–99)
Glucose-Capillary: 158 mg/dL — ABNORMAL HIGH (ref 70–99)
Glucose-Capillary: 158 mg/dL — ABNORMAL HIGH (ref 70–99)
Glucose-Capillary: 160 mg/dL — ABNORMAL HIGH (ref 70–99)
Glucose-Capillary: 167 mg/dL — ABNORMAL HIGH (ref 70–99)
Glucose-Capillary: 174 mg/dL — ABNORMAL HIGH (ref 70–99)
Glucose-Capillary: 180 mg/dL — ABNORMAL HIGH (ref 70–99)
Glucose-Capillary: 181 mg/dL — ABNORMAL HIGH (ref 70–99)
Glucose-Capillary: 193 mg/dL — ABNORMAL HIGH (ref 70–99)
Glucose-Capillary: 201 mg/dL — ABNORMAL HIGH (ref 70–99)
Glucose-Capillary: 202 mg/dL — ABNORMAL HIGH (ref 70–99)
Glucose-Capillary: 208 mg/dL — ABNORMAL HIGH (ref 70–99)
Glucose-Capillary: 214 mg/dL — ABNORMAL HIGH (ref 70–99)
Glucose-Capillary: 252 mg/dL — ABNORMAL HIGH (ref 70–99)
Glucose-Capillary: 83 mg/dL (ref 70–99)
Glucose-Capillary: 86 mg/dL (ref 70–99)

## 2011-01-24 LAB — DIFFERENTIAL
Basophils Relative: 0 % (ref 0–1)
Eosinophils Absolute: 0.1 10*3/uL (ref 0.0–0.7)
Eosinophils Absolute: 0.1 10*3/uL (ref 0.0–0.7)
Eosinophils Relative: 1 % (ref 0–5)
Eosinophils Relative: 2 % (ref 0–5)
Lymphs Abs: 1.3 10*3/uL (ref 0.7–4.0)
Monocytes Absolute: 0.7 10*3/uL (ref 0.1–1.0)
Monocytes Relative: 10 % (ref 3–12)
Monocytes Relative: 13 % — ABNORMAL HIGH (ref 3–12)
Neutrophils Relative %: 66 % (ref 43–77)

## 2011-01-24 LAB — COMPREHENSIVE METABOLIC PANEL
ALT: 15 U/L (ref 0–35)
ALT: 25 U/L (ref 0–35)
AST: 19 U/L (ref 0–37)
AST: 20 U/L (ref 0–37)
Albumin: 3.1 g/dL — ABNORMAL LOW (ref 3.5–5.2)
Alkaline Phosphatase: 72 U/L (ref 39–117)
Calcium: 8.4 mg/dL (ref 8.4–10.5)
GFR calc Af Amer: >60 mL/min (ref >60)
GFR calc Af Amer: >60 mL/min (ref >60)
Glucose, Bld: 180 mg/dL — ABNORMAL HIGH (ref 70–99)
Potassium: 4.1 mEq/L (ref 3.5–5.1)
Sodium: 138 mEq/L (ref 135–145)
Sodium: 141 mEq/L (ref 135–145)
Total Protein: 5.5 g/dL — ABNORMAL LOW (ref 6.0–8.3)
Total Protein: 5.9 g/dL — ABNORMAL LOW (ref 6.0–8.3)

## 2011-01-24 LAB — POCT I-STAT, CHEM 8
BUN: 29 mg/dL — ABNORMAL HIGH (ref 6–23)
Creatinine, Ser: 1 mg/dL (ref 0.4–1.2)
Glucose, Bld: 153 mg/dL — ABNORMAL HIGH (ref 70–99)
HCT: 41 % (ref 36.0–46.0)
Potassium: 5.3 mEq/L — ABNORMAL HIGH (ref 3.5–5.1)
Sodium: 137 mEq/L (ref 135–145)

## 2011-01-24 LAB — CBC
HCT: 36.1 % (ref 36.0–46.0)
Hemoglobin: 11.9 g/dL — ABNORMAL LOW (ref 12.0–15.0)
Hemoglobin: 13.1 g/dL (ref 12.0–15.0)
MCHC: 33.6 g/dL (ref 30.0–36.0)
RDW: 13.6 % (ref 11.5–15.5)
RDW: 13.7 % (ref 11.5–15.5)
RDW: 13.8 % (ref 11.5–15.5)

## 2011-01-24 LAB — BASIC METABOLIC PANEL
Calcium: 8.4 mg/dL (ref 8.4–10.5)
Chloride: 117 mEq/L — ABNORMAL HIGH (ref 96–112)
GFR calc Af Amer: >60 mL/min (ref >60)
GFR calc Af Amer: >60 mL/min (ref >60)
GFR calc non Af Amer: >60 mL/min (ref >60)
GFR calc non Af Amer: >60 mL/min (ref >60)
Glucose, Bld: 180 mg/dL — ABNORMAL HIGH (ref 70–99)
Glucose, Bld: 197 mg/dL — ABNORMAL HIGH (ref 70–99)
Potassium: 4.1 mEq/L (ref 3.5–5.1)
Potassium: 4.4 mEq/L (ref 3.5–5.1)
Sodium: 141 mEq/L (ref 135–145)
Sodium: 143 mEq/L (ref 135–145)
Sodium: 145 mEq/L (ref 135–145)

## 2011-01-24 LAB — URINE CULTURE: Colony Count: NO GROWTH

## 2011-01-24 LAB — URINALYSIS, ROUTINE W REFLEX MICROSCOPIC
Hgb urine dipstick: NEGATIVE
Nitrite: NEGATIVE
Specific Gravity, Urine: 1.022 (ref 1.005–1.030)
pH: 5.5 (ref 5.0–8.0)

## 2011-01-24 LAB — CK TOTAL AND CKMB (NOT AT ARMC): Total CK: 245 U/L — ABNORMAL HIGH (ref 7–177)

## 2011-01-25 LAB — COMPREHENSIVE METABOLIC PANEL
ALT: 27 U/L (ref 0–35)
AST: 27 U/L (ref 0–37)
Albumin: 3.7 g/dL (ref 3.5–5.2)
Alkaline Phosphatase: 87 U/L (ref 39–117)
CO2: 24 mEq/L (ref 19–32)
Chloride: 104 mEq/L (ref 96–112)
Creatinine, Ser: 0.98 mg/dL (ref 0.4–1.2)
GFR calc Af Amer: >60 mL/min (ref >60)
GFR calc non Af Amer: 56 mL/min — ABNORMAL LOW (ref >60)
Potassium: 3.9 mEq/L (ref 3.5–5.1)
Total Bilirubin: 0.6 mg/dL (ref 0.3–1.2)

## 2011-01-25 LAB — GLUCOSE, CAPILLARY
Glucose-Capillary: 159 mg/dL — ABNORMAL HIGH (ref 70–99)
Glucose-Capillary: 170 mg/dL — ABNORMAL HIGH (ref 70–99)
Glucose-Capillary: 177 mg/dL — ABNORMAL HIGH (ref 70–99)
Glucose-Capillary: 213 mg/dL — ABNORMAL HIGH (ref 70–99)
Glucose-Capillary: 220 mg/dL — ABNORMAL HIGH (ref 70–99)
Glucose-Capillary: 337 mg/dL — ABNORMAL HIGH (ref 70–99)

## 2011-01-25 LAB — CBC
MCV: 92.4 fL (ref 78.0–100.0)
Platelets: 245 10*3/uL (ref 150–400)
RBC: 4.45 MIL/uL (ref 3.87–5.11)
WBC: 9.5 10*3/uL (ref 4.0–10.5)

## 2011-01-25 LAB — URINALYSIS, ROUTINE W REFLEX MICROSCOPIC
Glucose, UA: NEGATIVE mg/dL
Hgb urine dipstick: NEGATIVE
Ketones, ur: NEGATIVE mg/dL
Protein, ur: NEGATIVE mg/dL
pH: 7 (ref 5.0–8.0)

## 2011-01-25 LAB — URINE MICROSCOPIC-ADD ON

## 2011-01-25 LAB — PROTIME-INR: Prothrombin Time: 12.8 seconds (ref 11.6–15.2)

## 2011-01-25 LAB — DIFFERENTIAL
Basophils Absolute: 0.1 10*3/uL (ref 0.0–0.1)
Basophils Relative: 1 % (ref 0–1)
Eosinophils Absolute: 0.1 10*3/uL (ref 0.0–0.7)
Eosinophils Relative: 1 % (ref 0–5)
Monocytes Absolute: 1 10*3/uL (ref 0.1–1.0)

## 2011-02-01 ENCOUNTER — Other Ambulatory Visit: Payer: Self-pay | Admitting: Endocrinology

## 2011-03-01 NOTE — H&P (Signed)
[name], [name] NO.:  [id number]   MEDICAL RECORD NO.:  [id number]          PATIENT TYPE:  INP   LOCATION:  3312                         FACILITY:  [facility]   PHYSICIAN:  Payton Doughty, M.D.      DATE OF BIRTH:  1934/08/02   DATE OF ADMISSION:  04/28/2009  DATE OF DISCHARGE:                              HISTORY & PHYSICAL   ADMITTING DIAGNOSIS:  Spinal stenosis and compression fracture of L4.   BODY OF TEXT:  This is a 77 year old right-handed white lady who several  weeks ago had a decompressive laminectomy over the top of an L4  compression fracture.  She did well initially and then fell.  About 2  weeks ago, she has had increasing pain in her back and down her legs.  MRI showed restenosis.  Based on this, she is admitted now for  decompression and fusion at L4-L5.   MEDICAL HISTORY:  Remarkable for diabetes, hypertension, reflux,  hyperlipidemia, and osteoporosis.   MEDICATIONS:  Lodine, Prilosec, Reglan, Prozac, Prinivil, Lipitor,  Pamelor, Librium, Allegra, Flonase, Evista, Toprol, insulin, MiraLax,  Dulcolax, ciprofloxacin, calcium, oxycodone, Dilaudid, and Tylenol.   SURGICAL HISTORY:  Hysterectomy.   ALLERGIES:  DEMEROL, CODEINE, VOLTAREN, ERYTHROMYCIN, AND CECLOR.   SOCIAL HISTORY:  She does not smoke, does not drink.  She is not  working.   FAMILY HISTORY:  Mom died at 36, dad died at 70, both had hypertension.   REVIEW OF SYSTEMS:  Remarkable for glasses, balance disturbance, sinus  problems, hypertension, leg pain, arm weakness, leg weakness, back pain,  arm pain, leg pain, joint pain, indigestion, difficulty with memory and  concentration, diabetes.  HEENT:  Normal limits.  NECK:  She has limited range of motion of neck.  CHEST:  Clear.  CARDIAC:  Regular rate and rhythm.  ABDOMEN:  Nontender.  No hepatosplenomegaly.  EXTREMITIES:  No clubbing or cyanosis.  GU:  Deferred.  Peripheral pulses are good.  NEUROLOGIC:  She is awake, alert,  and oriented.  She is a bit sleepy  related to her massive medication intake.  Cranial nerves are intact.  Motor exam shows 5/5 strength throughout the upper and lower  extremities, save for the dorsiflexors in the feet, they are 5-/5.  She  says she cannot walk because of back and leg pain.   She is able to flex her lower extremities.   CLINICAL IMPRESSION:  Lumbar spondylosis, spinal stenosis of L4-5.  Plans for decompression and fusion at L4-5.  The risks and benefits have  been discussed with her and she wished to proceed.           ______________________________  Payton Doughty, M.D.     MWR/MEDQ  D:  04/28/2009  T:  04/29/2009  Job:  [id number]

## 2011-03-01 NOTE — H&P (Signed)
NAMEACHOL, AZPEITIA NO.:  [id number]   MEDICAL RECORD NO.:  [id number]          PATIENT TYPE:  EMS   LOCATION:  MAJO                         FACILITY:  [facility]   PHYSICIAN:  Ramiro Harvest, MD    DATE OF BIRTH:  [birth year]   DATE OF ADMISSION:  03/21/2009  DATE OF DISCHARGE:                              HISTORY & PHYSICAL   PRIMARY CARE PHYSICIAN:  Vikki Ports, M.D.   NEUROSURGEON:  Payton Doughty, M.D.   HISTORY OF PRESENT ILLNESS:  [name] is a 77 year old white  female with history of lumbar spondylosis and spinal stenosis of L5-S1  with radiculopathy, status post L4-5 to L5-S1 bilateral laminotomy and  foraminotomy Mar 06, 2009, history of type 2 diabetes, compression  fractures, hyperlipidemia who presents to the ED after a mechanical  fall.  Per the patient she was at home using a walker when she slipped  on the carpet, fell on the right side and was unable to walk or get off  the ground for approximately 2 hours.  The patient's friend tried  calling and when the friend had no response, came by the house and found  the patient on the floor.  The patient was complaining of some bilateral  lower extremity pain and inability to walk and as such, called EMS and  presented to the ED.  The patient denies any syncope, no chest pain or  shortness of breath, no palpitations, no abdominal pain, no cough, no  nausea, no vomiting, no diarrhea or constipation.  No focal neurological  symptoms.  No fever, no chills, no dysuria.  The patient does endorse  some generalized weakness.  In the ED, urinalysis done was negative.  Plain films of the hip and the pelvis were negative per ED physician.  The patient was unable to ambulate and as such we were called to admit  the patient for possible placement.   ALLERGIES:  BACTRIM, CECLOR, CODEINE, DEMEROL, ERYTHROMYCIN,  PROMETHAZINE, SULFUR, VOLTAREN.   PAST MEDICAL HISTORY:  1. Spinal stenosis and spondylosis at  L4-L5 and L5-S1, status post L4-      L5, L5-S1 bilateral laminotomy and foraminotomy.  2. Type 2 diabetes.  3. Hypertension.  4. Hyperlipidemia.  5. History of chronic back pain/degenerative disk disease.  6. History of gastroesophageal reflux disease.  7. History of L1 and L4 compression fractures.  8. Prior history of a right lower extremity pain secondary to      radiculopathy.  9. Osteoporosis.  10.History of constipation.  11.Depression/anxiety.  12.Allergic rhinitis home medications.   MEDICATIONS:  1. Chlordiazepoxide  5 mg p.o. b.i.d.  2. Prilosec 20 mg p.o. q.a.m.  3. Reglan 10 mg p.o. at bedtime.  4. Prozac 20 mg p.o. q.a.m.  5. Prinzide 20/25 p.o. daily.  6. Lipitor 180 mg p.o. at bedtime.  7. Pamelor 25 mg p.o. at bedtime.  8. Allegra 180 mg p.o. q.a.m.  9. Lodine 500 mg p.o. b.i.d.  10.Flonase 50 mcg, 2 sprays to each nostril nightly.  11.Evista 60 mg p.o. q.a.m.  12.Toprol XL 25 mg p.o. q.a.m.  13.NovoLog  sliding scale 35 units with breakfast, 15 units with lunch      and 50 units with supper.  14.Humulin 60 units at bedtime.  15.Zetia 10 mg p.o. q. 8 a.m.   SOCIAL HISTORY:  The patient is separated.  No tobacco use.  No alcohol  use.  No IV drug use.   FAMILY HISTORY:  Noncontributory.   REVIEW OF SYSTEMS:  As per HPI, otherwise negative.   PHYSICAL EXAMINATION:  VITAL SIGNS:  Temperature 97.5, blood pressure  110/61, heart rate of 105, respiratory rate 18, oxygen saturation 97% on  room air.  GENERAL:  Patient lying on gurney in no apparent distress.  HEENT:  Normocephalic, atraumatic.  Pupils equal, round and reactive to  light and accommodation.  Extraocular movements intact.  Oropharynx is clear.  No lesions, no  exudates.  NECK:  Supple.  No lymphadenopathy.  Dry mucous membranes.  RESPIRATORY:  Lungs are clear to auscultation bilaterally.  CARDIOVASCULAR:  Tachycardiac, regular rhythm.  ABDOMEN:  Soft, nontender, nondistended.  Positive bowel  sounds.  EXTREMITIES:  No clubbing, cyanosis or edema.  NEUROLOGIC:  The patient is alert and oriented x3.  Cranial nerves II-  XII grossly intact.  No focal deficits.  3-4/5 bilateral lower extremity  strength.  Sensation is intact.  Gait not tested.   LABORATORY DATA:  Urinalysis is yellow, clear, specific gravity 1.022,  pH of 5.5, glucose negative, bilirubin large, ketones negative, blood  negative, protein negative, urobilinogen 0.2, nitrite negative,  leukocytes negative, CK of 245, CK-MB 3.7, relative index of 1.5.  X-  rays of the bilateral hip with the pelvis:  No acute fracture,  degenerative change at L4-L5.   ASSESSMENT AND PLAN:  Ms. [name] a 77 year old female with  history of spinal stenosis and lumbar spondylosis at L4-L5, status post  bilateral laminotomy and foraminotomy Mar 06, 2009, history of type 2  diabetes presents to the ED with fall and inability to walk  1. Fall/inability to walk.  Fall was mechanical in nature.  Her      inability to walk is likely secondary to her fall.  The patient has      had a recent bilateral laminotomy and foraminotomy.  Also      complained bilateral lower extremity pain.  Will check plain films      of the lumbar spine.  Check a comprehensive metabolic profile.      Check a CBC.  Check a magnesium level.  PT/OT.  Will inform      neurosurgery of admission for now and possible placement.  2. Dehydration.  Hydrate with IV fluids and monitor.  3. Hypertension.  Hold blood pressure medications secondary to      dehydration.  4. Gastroesophageal reflux disease.  Protonix.  5. Hyperlipidemia.  Lipitor.  6. Allergic rhinitis.  Continue home dose Allegra, Flonase.  7. Depression/anxiety.  Prozac and Librium.  8. Osteoporosis.  9. Type 2 diabetes.  Check a hemoglobin A1c. Lantus and sliding scale      insulin.  10.Spinal stenosis and spondylosis of L4-5 and L5-S1, status post L4-5      and L5-S1 bilateral laminotomy and  foraminotomy on Mar 06, 2009 per      Dr. Channing Mutters.  11.Prophylaxis:  Protonix for GI prophylaxis.  Lovenox for DVT      prophylaxis.   It has been a pleasure taking care of Ms. [name].      Ramiro Harvest, MD  Electronically Signed  DT/MEDQ  D:  03/21/2009  T:  03/21/2009  Job:  [id number]   cc:   Vikki Ports, M.D.  Payton Doughty, M.D.

## 2011-03-01 NOTE — Op Note (Signed)
[name], DUSENBURY NO.:  [id number]   MEDICAL RECORD NO.:  [id number]          PATIENT TYPE:  INP   LOCATION:  3312                         FACILITY:  [facility]   PHYSICIAN:  Payton Doughty, M.D.      DATE OF BIRTH:  [birth year]   DATE OF PROCEDURE:  04/28/2009  DATE OF DISCHARGE:                               OPERATIVE REPORT   PREOPERATIVE DIAGNOSIS:  Herniated disk and spondylosis at L4-5.   POSTOPERATIVE DIAGNOSES:  Herniated disk and spondylosis at L4-5 plus  bilateral pars fractures.   DICTATING DOCTOR:  Payton Doughty, MD, Neurosurgery.   PROCEDURE:  L4-5 laminectomy, diskectomy, posterior lumbar interbody  fusion with PEEK cages, non-segmental pedicle screw fixation at L4-5 and  posterolateral arthrodesis.   ANESTHESIA:  General endotracheal.   PREPARATION:  Prepped and draped with alcohol wipe.   COMPLICATIONS:  None.   NURSE ASSISTANT:  Kasik, RN   DOCTOR ASSISTANT:  Stefani Dama, MD.   BODY OF TEXT:  This is a 77 year old lady who had decompression about 6  weeks ago and did well until a fall, now has back and leg pain, taken to  operative room, smoothly anesthetized and intubated, placed prone on the  operating table.  Following shave, prep, and drape in the usual sterile  fashion, the old skin incision was reopened and the lamina and  transverse processes of L4 and L5 were exposed bilaterally in the  subperiosteal plane.  Intraoperative x-ray confirmed correctness of  level and having confirmed correctness of level it was noted that both  pars interarticularis were fractured at L4.  Therefore, the inferior  articular process of L4 and the superior reticular process of L5 were  removed with Kerrison, Leksell,and a high-speed drill.  This exposed the  4 roots as they rounded the pedicle as well as the disk space.  There  was a large central ruptured disk that was removed approaching it from  both sides and complete diskectomy was carried out.   Both 4 and 5 roots  had been dissected free.  A 12 x 25 mm PEEK cages were packed with bone  and 4 degrees of lordosis were tapped into the disk space.  Pedicles  were then probed and pedicle screws of 5.75 x 40 mm were placed and  connected with rods.  Intraoperative x-ray showed good placement of  cages, screws, and rods.  Final tightener was applied.  Transverse  processes were decorticated with a high-speed drill and packed with BMP  mixed with the extender matrix in the patient's own bone.  Final x-ray  was  taken, showed everything to be good position.  Successive layers of 0  Vicryl, 2-0 Vicryl, and 3-0 nylon were used to close.  Betadine and  Telfa dressing was applied and made occlusive with OpSite and the  patient returned to the recovery room in good condition.           ______________________________  Payton Doughty, M.D.     MWR/MEDQ  D:  04/28/2009  T:  04/29/2009  Job:  [id number]

## 2011-03-01 NOTE — Discharge Summary (Signed)
NAMEKYANDRA, [name]              ACCOUNT NO.:  [id number]   MEDICAL RECORD NO.:  [id number]          PATIENT TYPE:  INP   LOCATION:  3316                         FACILITY:  [facility]   PHYSICIAN:  Stefani Dama, M.D.  DATE OF BIRTH:  1934/01/25   DATE OF ADMISSION:  03/06/2009  DATE OF DISCHARGE:  03/08/2009                               DISCHARGE SUMMARY   ADMITTING DIAGNOSES:  Lumbar spondylosis, spinal stenosis, and lumbar  radiculopathy L5 and S1.   DISCHARGE AND FINAL DIAGNOSES:  Lumbar spondylosis, spinal stenosis, and  lumbar radiculopathy L5 and S1.   CONDITION ON DISCHARGE:  Improving.   HOSPITAL COURSE:  [name] is a 77 year old individual who has had  an L4 compression fracture in the past.  She has biforaminal stenosis at  the L3.  At the L4-5, L5-S1 levels, she has had severe neurogenic  claudication and radiculopathy, and she has been advised regarding  surgical decompression.  She was taken to the hospital where this  procedure was performed.  Postoperatively, she was able to ambulate on  the first postoperative day; however, she required Foley catheter which  she requested to be left as mobilization to the bathroom was kind of  difficult.  Second hospital day, catheter was removed.  She is voiding  spontaneously.  Her incision is clean and dry and she has been advised  as her postoperative activities.  She is given a prescription for  Vicodin #50 without refills and she will be seen in the office in three  weeks' time for followup.   CONDITION ON DISCHARGE:  Improving.      Stefani Dama, M.D.  Electronically Signed     HJE/MEDQ  D:  03/08/2009  T:  03/09/2009  Job:  [id number]

## 2011-03-01 NOTE — H&P (Signed)
NAMEILETA, OFARRELL NO.:  [id number]   MEDICAL RECORD NO.:  [id number]          PATIENT TYPE:  INP   LOCATION:  3028                         FACILITY:  [facility]   PHYSICIAN:  Donalynn Furlong, MD      DATE OF BIRTH:  12-08-1933   DATE OF ADMISSION:  04/18/2009  DATE OF DISCHARGE:                              HISTORY & PHYSICAL   ADMITTING PHYSICIAN:  [facility].   PRIMARY CARE Ramone Gander:  Juluis Rainier, M.D.   CHIEF COMPLAINT:  Worsening lower back pain and right lower extremity  pain status post fall.   HISTORY OF PRESENT ILLNESS:  Ms. Karle is a 77 year old Caucasian  female who lives with her friend.  She presented to the [facility]  emergency department tonight after her friend persuaded her to come to  the ED due to worsening lower back pain and right lower extremity pain  recently.  The patient had a fall around 5 days prior to admission.  She  fell due to commode sliding from underneath her.  The patient landed on  her buttocks and started having increased pain in her back and right  lower extremity.  She was bruised on her right knee and right leg area.  The pain is acute and worse with certain positions or movements, patient  sitting and standing.  There is no radiating pain from the back to the  right lower extremity, but she does have localized pain over the right  knee and right leg area.  The pain gets better with pain medication but  does not resolve completely.   PAST MEDICAL HISTORY:  Hypertension, diabetes mellitus, gastroesophageal  reflux disease, anxiety degenerative joint disease of the right knee,  hyperlipidemia, cholecystectomy, hysterectomy, lumbar stenosis, and  radiculopathy.  History of L4 compression fracture status post L5-S1  foraminotomy in May 2010.   PAST SURGICAL HISTORY:  As per past medical history.   ALLERGIES:  DEMEROL, CODEINE, ERYTHROMYCIN, DICLOFENAC, SULFA,  CEPHALOSPORIN, CHLORZOXAZONE.   HOME MEDICATIONS:   Include Prozac 20 mg daily, nortriptyline 25 mg at  night, Reglan 10 mg at night, Prilosec 20 mg daily, Allegra 180 mg  daily, Librium 5 mg daily, oxycodone 5 mg q.4 h p.r.n., Lipitor 80 mg at  night, Zetia 10 mg daily, Lodine 500 mg b.i.d., Evista, Os-Cal plus D  b.i.d., Toprol XL 25 mg daily, Ultram 50 mg q.i.d. p.r.n., Neurontin 400  mg q.8 h, Flonase.   SOCIAL HISTORY:  The patient lives with her friend.  No history of  alcohol, drug, or tobacco use.   FAMILY HISTORY:  Nothing remarkable. Noncontributory to the current  problem.  She does have a family history of coronary artery disease and  CVA.   REVIEW OF SYSTEMS:  Positive as per the HPI.  Otherwise negative.  Review of systems done for 14 systems.   PHYSICAL EXAMINATION:  VITAL SIGNS:  Blood pressure 133/70, pulse 94,  respirations 22, temperature 97.2, oxygen saturation 94% on room air.  GENERAL:  Alert and oriented x3, lying in bed.  CARDIOVASCULAR: S1 and S2 regular.  No murmurs, rubs,  or gallops.  LUNGS: Clear to auscultation bilaterally. No wheezes, rales, rhonchi, or  crackles.  ABDOMEN: Nontender, nondistended.  Bowel sounds are present.  EXTREMITIES:  Slight warmth over the right leg and right knee areas.  Otherwise no cyanosis. The patient does have +1 pitting edema at both  ankles.  SKIN: No rash or bruits except for the right knee and right leg where  there is a bruised area.  NEUROLOGICAL: The patient is unable to participate in the neurological  examination due to her pain in the back.  She is not able to get up.  She is able to move her left lower extremity without any difficulty, and  strength is 5/5 in the left lower extremity.  Right lower extremity  strength is 2/5, and she mentions that is chronic, and there is no  worsening of her right lower leg weakness recently.  Both upper  extremities sensation, movement, and reflexes are within normal limits.  HEENT: Head: Normocephalic and nontraumatic.  Eyes:   Pupils are equal,  round, and reactive to light and accommodation. Extraocular muscles are  intact.  Oral cavity: Oral mucosa moist. No thrush.  No icterus.  NECK: No thyromegaly or JVD.   LABS:  MRI done on the lumbar spine.  Results are pending at this time.  Urinalysis with a few squamous epithelial cells, 7-10 WBCs, bacteria  few, moderate leukocytes.  Basic metabolic panel normal except for  glucose 100. GFR more than 60.  CBC differential unremarkable except for  hemoglobin 11.   ASSESSMENT AND PLAN:  1. Worsened lower back pain and right lower extremity pain status post      fall in a patient with a history of recent lumbar foraminotomy for      compression fracture.  Most likely this is lumbar stenosis related      pain and status post fall it is getting worse and some bruising      over the right lower extremity suggestive of soft tissue trauma      over the right lower extremity.  2. History of diabetes mellitus.  3. History of hypertension.  4. History of gastroesophageal reflux disease.  5. Anxiety.  6. Degenerative disk disease of the lumbar spine.  7. Depression.  8. Hyperlipidemia.  9. Cholecystectomy.  10.Hysterectomy,  11.Degenerative joint disease of the right knee.  12.History of lumbar stenosis plus radiculopathy.   PLAN:  Will admit the patient on [facility] under Dr. Rito Ehrlich on  medical bed with a diagnosis of worsened low back and right lower  extremity pain status post fall. The patient is full code.  Will give  her a diabetic diet.  Will continue normal saline at 50 mL/hour for  hydration and IV morphine p.r.n. for pain.  Will start Lovenox and  Nexium for DVT and GI prophylaxis, respectively. Will provide p.o.  Tylenol, IV Zofran p.r.n. for fever and nausea  and vomiting respectively.  Will continue Prozac, nortriptyline, Reglan,  Allegra, Librium, Lipitor, Lodine, Toprol XL, Neurontin, Flonase at home  dose.  Will get PT/OT consult in the  morning.  Will await MRA results  for further plan.      Donalynn Furlong, MD  Electronically Signed     TVP/MEDQ  D:  04/19/2009  T:  04/19/2009  Job:  [id number]   cc:   Payton Doughty, M.D.  Juluis Rainier, M.D.  Hollice Espy, M.D.

## 2011-03-01 NOTE — H&P (Signed)
[name], [name] NO.:  [id number]   MEDICAL RECORD NO.:  [id number]          PATIENT TYPE:  INP   LOCATION:  3316                         FACILITY:  [facility]   PHYSICIAN:  Payton Doughty, M.D.      DATE OF BIRTH:  October 15, 1934   DATE OF ADMISSION:  03/06/2009  DATE OF DISCHARGE:                              HISTORY & PHYSICAL   ADMISSION DIAGNOSIS:  Spondylosis and compression fracture at L4-5, L5-  S1.   BODY OF TEXT:  This is a 77 year old right-handed white lady, who fell  last August, had back pain, and ended up in [name].  She had an old L1, new L4 compression fracture.  She went to the  extended care facility and had pain in her back and right leg.  Been  following her for a while.  She has got a compression fracture at L4  with a little bit of retropulsed bone, some disk, and stenosis at that  level, as well as at L5-S1.  Epidural steroids were of no help, and she  is now admitted for decompressive laminotomy, foraminotomy at L4-5 and  L5-S1.   PAST MEDICAL HISTORY:  Marked for diabetes and hypertension.  She has  reflux, hyperlipidemia, and osteoporosis.   She is on Lodine, Prilosec, Reglan, Prozac, Prinivil, Lipitor, Pamelor,  Librium, Allegra, Flonase, Evista, Toprol, insulin, MiraLax, Dulcolax,  ciprofloxacin, calcium, oxycodone, Dilaudid, Tylenol.   PAST SURGICAL HISTORY:  Hysterectomy.   ALLERGIES:  1. DEMEROL.  2. CODEINE.  3. VOLTAREN.  4. ERYTHROMYCIN.  5. CECLOR.   SOCIAL HISTORY:  She does not smoke, drink, nor does she work.   FAMILY HISTORY:  Mother died at 36, father died at 42, both had  hypertension.   REVIEW OF SYSTEMS:  Marked for wearing glasses, balance disturbance,  sinus problems, hypertension, leg pain, arm weakness, leg weakness, back  pain, arm pain, leg pain, joint pain, indigestion, difficult memory and  concentration, and diabetes.   PHYSICAL EXAMINATION:  HEENT:  Normal limits.  NECK:  She has  reasonably good range of motion of neck.  CHEST:  Clear.  CARDIAC:  Regular rate and rhythm.  ABDOMEN:  Nontender.  No hepatosplenomegaly.  EXTREMITIES:  Without clubbing or cyanosis.  GENITOURINARY:  Deferred.  Peripheral pulses are good.  NEUROLOGIC:  She is awake, alert, and oriented.  Her pupils are small  but reactive.  Extraocular movements are intact.  Facial movements  intact.  Shoulder shrug is normal.  Tongue protrudes in the midline, and  she describes no swallowing difficulties.  Motor exam shows 5/5 strength  throughout the upper extremities.  Sensory, dysesthesias are described  roughly in right L4 distribution.   MRI shows a new compression fracture at L4 and about 40% loss of bone  height and that has not changed much since last September.  She has got  stenosis at 4-5.  She is a terrible candidate for fusion.  I think  limited decompression, possible diskectomy at both 4-5 and 5-1 may help  ease discomfort in her legs.  She understands wound drainage for her  back.  The risks and benefits have been discussed with her, and she  wishes to proceed.           ______________________________  Payton Doughty, M.D.     MWR/MEDQ  D:  03/06/2009  T:  03/07/2009  Job:  [id number]

## 2011-03-01 NOTE — Discharge Summary (Signed)
NAMEANGELISA, [name] NO.:  [id number]   MEDICAL RECORD NO.:  [id number]          PATIENT TYPE:  INP   LOCATION:  3003                         FACILITY:  [facility]   PHYSICIAN:  Payton Doughty, M.D.      DATE OF BIRTH:  [birth year]   DATE OF ADMISSION:  04/28/2009  DATE OF DISCHARGE:  05/06/2009                               DISCHARGE SUMMARY   ADMITTING DIAGNOSES:  __________L4-5 and bilateral pars interarticularis  fractures L4-5.   PROCEDURE:  L4-5 laminectomy, diskectomy, posterior lumbar antibody  fusion with PEEK cages, nonsegmental pedicle screw fixation and  posterolateral arthrodesis.   SURGICAL COMPLICATIONS:  None.   DISCHARGE STATUS:  Alive and well.   BODY OF TEXT:  A 77 year old lady who had a decompressive laminectomy  several months ago, had a fall, had marked increased pain in her back.  MR showed increased disk at 3-4.   MEDICAL HISTORY:  1. Diabetes.  2. Hypertension.  3. Reflux.  4. Hyperlipidemia.  5. Osteoporosis.   MEDICATIONS:  Lodine, Prilosec, Reglan, Prozac, Lipitor, Pamelor,  Librium, Allegra, Flonase, Evista, Toprol, insulin MiraLax, Dulcolax,  ciprofloxacin, calcium, Percocet, Dilaudid and Tylenol.   SURGICAL HISTORY:  Hysterectomy.   ALLERGIES:  1. DEMEROL.  2. CODEINE.  3. VOLTAREN.  4. ERYTHROMYCIN.  5. CECLOR.   SOCIAL HISTORY:  She does not smoke and does not drink, obviously is not  working.   General exam was intact.  A1c was about 7.5.  Neurologically, she had  bilateral dorsiflexion weakness at about 5-/5.  She said she could not  walk because of back and leg pain.  She was taken to the OR and  underwent the fusion as noted above.  Postoperatively, she has actually  done reasonably well.  Sugars were under good control on the sliding  scale and now she is resumed her standard insulin from home.  Dorsiflexor strength has returned to normal.  She has started working in  rehab, still has a fair amount of back pain  and is generally  debilitated.  She had a lot of difficulty with somnolence and as a  result of this her __________ was tapered over about a 4-day period and  her level of consciousness is much better.  She is awake, alert and  participating avidly in physical and occupational therapy.  She had a  small amount of drainage from her incision that has now stopped.   CURRENT MEDICATIONS:  Protonix, Prozac, Reglan, Zestril,  hydrochlorothiazide, Crestor, Pamelor, Claritin, Flonase, Evista,  Toprol, Zetia, Lodine and ciprofloxacin.  She has resumed her standard  insulin that she uses at home which is written on her medicines  discharge sheet.   She needs follow-up in the [facility] offices in about 10 days for suture  removal.           ______________________________  Payton Doughty, M.D.     MWR/MEDQ  D:  05/06/2009  T:  05/06/2009  Job:  [id number]

## 2011-03-01 NOTE — H&P (Signed)
NAMELOGYN, DEDOMINICIS NO.:  [id number]   MEDICAL RECORD NO.:  [id number]          PATIENT TYPE:  IPS   LOCATION:  4009                         FACILITY:  [facility]   PHYSICIAN:  Ranelle Oyster, M.D.DATE OF BIRTH:  August 21, 1934   DATE OF ADMISSION:  03/26/2009  DATE OF DISCHARGE:                              HISTORY & PHYSICAL   CHIEF COMPLAINTS:  Back and right leg pain.   PRIMARY PHYSICIAN:  Vikki Ports, MD   NEUROSURGEONPayton Doughty, MD   HISTORY OF PRESENT ILLNESS:  This is a 77 year old white female with a  history of lumbar stenosis, radiculopathy with recent L4-L5 and L5-S1  laminectomy and foraminotomy on Mar 06, 2009.  She was discharged home  on Mar 08, 2009, and suffered a fall landing on her right side.  MRI of  the spine showed no changes in the spine except for slight increase in  disk protrusion at L5-S1, may have been some worsening of L4 endplate  compression.  The patient has been struggling due to pain and weakness,  particularly in her right leg.  She has had some persistent numbness in  both feet chronically.  The patient has been working with therapy, still  requiring a significant assistance.  She needs to be independent to  return home.  Rehab evaluated yesterday and felt that she can benefit  from an inpatient admission and thus she was brought today.   REVIEW OF SYSTEMS:  Notable for right leg pain.  She has been regular  with her bowel movements.  She reports numbness.  She denies shortness  breath, chest pain, problems with breathing.  Full 14-point review is in  the written history and physical.   PAST MEDICAL HISTORY:  Positive for diabetes type 2, dyslipidemia,  cholecystectomy, degenerative disk disease with L1 and L4 compression  fractures, depression with anxiety, hysterectomy, and cholecystectomy.   FAMILY HISTORY:  Positive for CAD and stroke.   SOCIAL HISTORY:  The patient lives alone in 1-level house, 1 step to   enter.  She does not smoke or drink.  She has assistance with house  care, and friends can check in on her discharge.   ALLERGIES:  1. BACTRIM.  2. SULFA.  3. DEMEROL.  4. CODEINE.  5. ERYTHROMYCIN.  6. VOLTAREN.  7. CECLOR.   HOME MEDICATIONS:  NPH insulin, Librium 5 mg b.i.d., Prilosec, Reglan,  Prozac, Prinzide, Lipitor, Pamelor, Allegra, Lodine, Flomax, Toprol.   PHYSICAL EXAMINATION:  VITAL SIGNS:  The patient's blood pressure is  130/74, pulse is 84, respiratory rate 16.  GENERAL:  She is pleasant, in no acute distress.  She is obese.  HEENT:  Pupils are equally round and reactive to light.  Ear, nose, and  throat exam is notable for full dentures.  Mucosa is pink and moist,  otherwise.  NECK:  Supple without JVD or lymphadenopathy.  CHEST:  Clear to auscultation bilaterally without wheezes, rales, or  rhonchi.  HEART:  Regular rate and rhythm without murmur, rubs, or gallops.  ABDOMEN:  Soft, nontender.  Bowel sounds are positive.  SKIN:  Notable for ecchymoses in the arms and legs, particularly the  right knee.  EXTREMITIES:  The right knee was painful to manipulation today.  She has  some edema in bilateral legs, trace to 1+.  NEUROLOGIC:  Cranial nerves II through XII are intact.  Reflexes are  generally 1+ throughout except the ankles which were trace.  Sensation  is decreased in the bilateral toes, as well as the dorsum of the feet.  She also has some sensory loss in the L5 dermatome on the right.  Strength is generally 4/5 in the upper extremities.  Left lower  extremity, she is 3/5 with a 4/5 proximal and 4/5 distally.  Right lower  extremity, she is 3/5 at the hip, 2/5 at the knee, 3/5 plantar flexion,  1+/5 ankle dorsiflexion.  There is a pain component to her right leg  weakness.  Judgment, orientation, memory, and mood are all within  functional limits.   POST ADMISSION PHYSICIAN EVALUATION:  1. Functional deficits secondary to lumbar stenosis and recent       surgery.  The patient with L1 and L4 compression fractures.  She      likely has L5 radiculopathy at least on the right.  The patient is      also with potential diabetic peripheral neuropathy.  2. The patient is admitted to receive collaborative interdisciplinary      care between the physiatrist, rehab nursing staff, and therapy      team.  3. The patient's level of medical complexity and substantial therapy      needs in context of the medical necessity cannot be provided at a      lesser intensity of care.  4. The patient has experienced substantial functional loss from her      baseline.  Premorbidly, she was modified independent.  Within the      last 24 hours, she is min guard assist for transfers being able to      stand for 30 seconds.  She has difficulty extending hips.  She has      been set up for upper body care and total assist lower body care.      The patient has had minimal ambulation at this point.  Judging by      the patient's diagnosis, physical exam, and functional history, she      has potential for functional progress, which will result in      measurable gains while in inpatient rehab.  These gains will be of      substantial and practical use upon discharge to home in      facilitating mobility and self-care.  5. Physiatrist will provide 24-hour management of medical needs, as      well as oversight of therapy plan/treatment and provide guidance as      appropriate regarding interaction of the 2.  Medical problem list      and plan are listed below.  6. 24-hour rehab nursing will assist in management of the patient's      pain needs, as well as bowel and bladder management, and medication      administration, safety and integration of therapy concepts and      techniques.  7. PT will assess and treat for lower extremity strength, range of      motion, potential splinting, and other adaptive equipment, function      mobility and gait.  Goals modified  independent.  She may need an  custom-made AFO for the right foot.  We will observe as she      progresses.  8. OT will assess and treat for upper extremity use, ADLs, safety with      ADLs, as well as the patient's education.  Goals are modified      independent.  9. Case management/social worker will assess and treat for      psychosocial issues and discharge planning.  10.Team conferences will be held weekly to assess progress towards      goals and to determine barriers at discharge.  11.The patient has demonstrated sufficient medical stability and      exercise capacity to tolerate at least 3 hours of therapy per day      at least 5 days per week.  12.Estimated length of stay is 10-14 days.  Prognosis is good.   MEDICAL PROBLEM LIST AND PLAN:  1. Bladder:  We will discontinue Foley in the morning and begin      voiding trial with I and O caths p.r.n.  2. Deep venous thrombosis prophylaxis:  Lovenox 40 mg subcu daily.  3. Diabetes:  Lantus insulin 30 units nightly.  We will check CBGs      a.c. and nightly.  Adjust insulin accordingly.  The patient is      Humalog insulin 35 units with meals depending on food intake as      well.  4. Mood:  Prozac 20 mg p.o. daily.  She seems fairly well compensated      regarding her depression and anxiety at this point.  5. Pain management:  Lodine 500 mg p.o. b.i.d.  We will schedule      Ultram 50 mg t.i.d. to start baseline pain control.  Consider      anticonvulsant as well.  Use oxycodone for breakthrough symptoms.      Lidocaine patches also to low back daily.  Pain will be an      inhibiting factor for her.  6. Blood pressure control with Toprol-XL 25 mg daily.  7. Allergies, ALLEGRA and FLONASE.      Ranelle Oyster, M.D.  Electronically Signed     ZTS/MEDQ  D:  03/26/2009  T:  03/27/2009  Job:  [id number]

## 2011-03-01 NOTE — Discharge Summary (Signed)
NAMESUDIKSHA, VICTOR NO.:  [id number]   MEDICAL RECORD NO.:  [id number]          PATIENT TYPE:  INP   LOCATION:  3028                         FACILITY:  [facility]   PHYSICIAN:  Hollice Espy, M.D.DATE OF BIRTH:  December 31, 1933   DATE OF ADMISSION:  04/18/2009  DATE OF DISCHARGE:  04/21/2009                               DISCHARGE SUMMARY   PRIMARY CARE PHYSICIAN:  Juluis Rainier, MD   CONSULTANTS DURING THIS CASE:  Payton Doughty, MD, Neurosurgery.   DISCHARGE DIAGNOSES:  1. Worsening spinal stenosis.  2. Secondary gait dysfunction.  3. Hypertension.  4. Diabetes mellitus.  5. Morbid obesity.  6. Degenerative joint disease.  7. Gastroesophageal reflux disease.  8. Anxiety.   DISCHARGE MEDICATIONS:  For this patient are as follows:  The patient  will continue her previous medications:  1. Prozac 20.  2. Reglan 10 nightly.  3. Prilosec 20.  4. Allegra 180.  5. Librium 5.  6. Lipitor 80 nightly.  7. Zetia 10.  8. Lodine 500 b.i.d.  9. Evista 60 p.o. daily.  10.Calcium 600 p.o. daily.  11.Toprol-XL 25 daily.  12.Ultram 50 nightly.  13.Neurontin 400 every 8 hours.  14.Flonase nasal spray daily.   New medicines for this patient are as follows:  1. Metformin 1000 p.o. b.i.d.  2. OxyIR 5 mg q.4 h. p.r.n. for breakthrough pain.  3. Oxy ER 10 mg q.12 h. as scheduled.   HOSPITAL COURSE:  The patient is a 77 year old white female with past  medical history of spinal stenosis who was operated on by Dr. Channing Mutters  approximately 6 weeks prior.  She had continued to have problems with  increasing back pain.  She has gotten to the that point where she is not  able to stand.  She came back into the emergency room and she was  evaluated.  A new MRI showed increased spinal stenosis of the area of L4-  5.  Dr. Channing Mutters in evaluation felt that he would need to be aggressive in  terms of her management and planned to operate on her again.  Due to  scheduling issues, he was not  able to operate on her at any point this  week.  The patient was evaluated by PT/OT.  She was found be at high  fall risk needing maximal assistance for bed mobility and transfers.  It  was recommended that in the interim until she go to the OR she go to a  skilled nursing facility.  The patient was absolutely adamant about this  despite multiple attempts by myself and the case manager and discussion.  The patient said that she is able to have two 24-hour caregivers with  her and declined attempts again at skilled nursing.  She is of sound  mind and felt to be very conscious to make the decisions understanding  the risks for this, so in attempts to provide some form of compromise  providing full home health services including PT, OT, RN, and aide and  the patient will follow up in the next week with Dr. Channing Mutters for planned  surgery.  Her discharge diet will be a heart-healthy diet.  Activity  will be as what she can tolerate as per PT/OT.  She will follow up with  her PCP, Dr. Juluis Rainier in about 4 weeks.  Her disposition is she  has some minimal improvement in that her pain is better tolerated with  new additional medications, but overall her long-term prognosis would  depend on her surgery.      Hollice Espy, M.D.  Electronically Signed     SKK/MEDQ  D:  04/21/2009  T:  04/21/2009  Job:  [id number]   cc:   Payton Doughty, M.D.  Juluis Rainier, M.D.

## 2011-03-01 NOTE — H&P (Signed)
NAMESACHI, [name]              ACCOUNT NO.:  [id number]   MEDICAL RECORD NO.:  [id number]          PATIENT TYPE:  INP   LOCATION:  1616                         FACILITY:  [facility]   PHYSICIAN:  Kela Millin, M.D.DATE OF BIRTH:  [birth year]   DATE OF ADMISSION:  05/26/2008  DATE OF DISCHARGE:                              HISTORY & PHYSICAL   PRIMARY CARE PHYSICIAN:  Dr. Theresia Lo.   CHIEF COMPLAINT:  Persistent back pain and unable to care for herself at  home.   HISTORY OF PRESENT ILLNESS:  The patient is a 77 year old white female  who lives alone and was seen in the ER yesterday, 08/09 status post a  mechanical fall (tripped on a box) and a CT scan of the lumbar spine  revealed L1 and L4 compression fractures with mild retropulsion but no  significant canal compromise, also combination of diffuse annular bulge  and osteophytic ridging with mild spinal and lateral recess stenosis of  L2-L3 and L3-L4.  After been evaluated in the ER she was sent home on  pain meds.  She states that the back pain persisted and she has also  been having right leg pain and is unable to get around or take care of  herself/ADLs and so she came back to the ER.  She denies paresthesias,  dysuria, fevers, cough, chest pain, diarrhea, melena and hematochezia.  She was seen in the ER and orthopedics consulted per the ED physician  and Dr. Ranell Patrick came by to see the patient and admission to the medicine  service requested.   PAST MEDICAL HISTORY:  1. As above.  2. Diabetes mellitus.  3. Hypertension.  4. Hypercholesterolemia.  5. History of chronic back pain/degenerative disk disease.  6. History of GERD.   MEDICATIONS:  1. Lodine 500 mg p.o. b.i.d.  2. Librium 5 mg b.i.d.  3. Prilosec 20 mg daily.  4. Reglan 10 mg q.h.s.  5. Prozac 20 mg daily.  6. Prinzide 20/25 mg daily.  7. Lipitor 80 mg q.h.s.  8. Pamelor 25 mg q.h.s.  9. Allegra 180 mg daily.  10.Flonase.  11.Evista 60 mg daily.  12.Metoprolol 12.5 mg p.o. b.i.d.  13.NovoLog.  14.NPH 70 units q.h.s.   ALLERGIES:  1. BACTRIM.  2. SEQUEL.  3. CODEINE.  4. DEMEROL.  5. ERYTHROMYCIN.  6. PROMETHAZINE.  7. SULFA.  8. VOLTAREN.   SOCIAL HISTORY:  Remote history of tobacco, quit many years ago.  She  denies alcohol.   FAMILY HISTORY:  Her mother is deceased, had a stroke at age 45.  Father  deceased, had an MI at age 3.   REVIEW OF SYSTEMS:  As per HPI.  Other review of systems negative.   PHYSICAL EXAM:  In general, the patient is an obese, elderly white  female in no respiratory distress.  VITAL SIGNS:  Temperature is 97.5.  Blood pressure 114/64, initially  95/39.  Pulse is 96 and initially 108.  Respiratory rate 18.  O2 sat of  96%.  HEENT:  PERRL, EOMI, slightly dry mucous membranes and no oral exudates.  NECK:  Supple, no adenopathy,  no thyromegaly and no JVD.  LUNGS:  Clear to auscultation bilaterally.  No crackles or wheezes.  CARDIOVASCULAR:  Regular rate and rhythm.  Normal S1, S2.  ABDOMEN:  Soft, bowel sounds present, nontender, nondistended.  No  organomegaly and no masses palpable.  BACK:  She is tender over her lumbar spine and with decreased range of  motion.  EXTREMITIES:  The straight leg raise is positive on the right at about  30 degrees no edema and no cyanosis.  NEURO:  Cranial Nerves: II-XII grossly intact.  Sensory grossly intact.  The strength in her upper extremities 4-5/5, left leg 4-5/5 and right  leg about 3-4/5.   LABORATORY DATA:  As per HPI, a CBC, a CMET and urinalysis ordered and  pending.   ASSESSMENT AND PLAN:  1. L1 and L4 compression fracture - as discussed above.  We will admit      for pain management, orthopedics consulted for further      recommendation/? kyphoplasty.  Follow and consult physical therapy      and occupational therapy.  2. Diabetes mellitus - monitor Accu-Cheks, NPH and sliding scale as      appropriate.  3. Hypertension - monitor blood  pressures and treat as appropriate.  4. Hypercholesterolemia - continue outpatient medications.  5. History of gastroesophageal reflux disease - proton pump inhibitor.  6. History of DJD/chronic back pain - pain management as above.      Kela Millin, M.D.  Electronically Signed     ACV/MEDQ  D:  05/27/2008  T:  05/27/2008  Job:  [id number]   cc:   Vikki Ports, M.D.  Fax: [phone]   Almedia Balls. Ranell Patrick, M.D.  Fax: [phone]   JEFFERY, DR.

## 2011-03-01 NOTE — Consult Note (Signed)
[name], [name] NO.:  [id number]   MEDICAL RECORD NO.:  [id number]          PATIENT TYPE:  INP   LOCATION:  5530                         FACILITY:  [facility]   PHYSICIAN:  Hilda Lias, M.D.   DATE OF BIRTH:  November 19, 1933   DATE OF CONSULTATION:  03/21/2009  DATE OF DISCHARGE:                                 CONSULTATION   Ms. Circle is a 77 year old female who underwent lumbar surgery by Dr.  Trey Sailors about 2 weeks ago.  Yesterday, she fell in the kitchen and hit  her face.  Because this morning she was having more back pain and hip  pain, she was brought to the emergency room where she was seen by Dr.  Janee Morn from the cryotherapy.  X-rays were obtained.  Right now, the  patient is awake.  Her main complaints are lower back pain and also pain  in her face.  Clinically, she has some ecchymosis in the chin as well as  some bruising on the lower lip.  Neurologically, she is oriented x3.  She has a lower back pain.  There is no weakness.  Sensation is normal.  There is some limitation of movement secondary to pain.  The x-ray of  the hip is normal.  The x-ray of the lumbar spine shows no fracture, but  there is some degenerative disk disease at L4-5.   CLINICAL IMPRESSION:  1. Trauma to the lumbar area.  2. Trauma to the face.   RECOMMENDATIONS:  The patient will continue as per [facility].  I  will follow her tomorrow and Dr. Channing Mutters will be taking over the  neurological portion on Monday, June 7.           ______________________________  Hilda Lias, M.D.     EB/MEDQ  D:  03/21/2009  T:  03/22/2009  Job:  [id number]

## 2011-03-01 NOTE — Discharge Summary (Signed)
[name], [name] NO.:  [id number]   MEDICAL RECORD NO.:  [id number]          PATIENT TYPE:  IPS   LOCATION:  4009                         FACILITY:  [facility]   PHYSICIAN:  Ranelle Oyster, M.D.DATE OF BIRTH:  08-Jan-1934   DATE OF ADMISSION:  03/26/2009  DATE OF DISCHARGE:  04/09/2009                               DISCHARGE SUMMARY   DISCHARGE DIAGNOSES:  1. Lumbar stenosis with radiculopathy, deconditioning.  2. Diabetes mellitus type 2.  3. Hypertension.  4. History of depression with elevated anxiety levels.   HISTORY OF PRESENT ILLNESS:  [name] is a 77 year old female with  history of lumbar stenosis with radiculopathy in right lower extremity  secondary to L4 compression fracture in 2009.  She was noted to have  some retropulsion with stenosis L4 with an L5-S1 and underwent L4-L5 and  L5-S1 lam with foraminotomy on Mar 06, 2009, with Dr. Channing Mutters.  She was  discharged to home, but re-admitted March 21, 2009, past falls and  complains of back and right hip pain.  MRI of L-spine showed L2-L3  narrowing and L3-L4 with severe stenosis with slight increase in disk  protrusion at L5-S1, right greater than left.  Dr. Channing Mutters evaluated the  patient and felt that she was not a surgical candidate, he recommended  conservative therapy for now.  The patient currently noted to have  limitation due to back pain as well as right lower extremity weakness  and right footdrop.  The patient with impairments in mobility and self-  care, she was evaluated by Rehab and felt to be an appropriate candidate  for inpatient rehab program.   PAST MEDICAL HISTORY:  Significant for:  1. DM type 2.  2. Dyslipidemia.  3. Cholecystectomy.  4. DDD with L1 through L4 compression fractures.  5. Depression.  6. Anxiety.  7. Hysterectomy.  8. Cholecystectomy.   ALLERGIES:  BACTRIM, SULFA, ERYTHROMYCIN, VOLTAREN, CODEINE, DEMEROL,  and PROMETHAZINE.   Family history is positive for  coronary artery disease and CVA.   SOCIAL HISTORY:  The patient lives alone in 1-level home with 1-step at  entry.  She does not use any tobacco or alcohol.  She has assistance  with housework every 2 weeks and friends can assist past discharge.   FUNCTIONAL HISTORY:  The patient was independent and driving prior to  admission.   FUNCTIONAL STATUS:  The patient is min guard assist for transfers, able  to stand 30 seconds; however, with flexed posture and difficulty  extending hip.  She is a setup assist for upper body care, total assist  for lower body care.   PHYSICAL EXAMINATION:  VITAL SIGNS:  Blood pressure 130/70, pulse 84,  respiratory rate 16.  GENERAL:  The patient is an alert and oriented female, obese, in no  acute distress.  HEENT:  Pupils equal, round, and reactive to light.  Oral mucosa moist  with full set of dentures noted.  Hearing intact.  NECK:  Supple without JVD or lymphadenopathy.  CHEST:  Clear to auscultation bilaterally without wheezes, rales, or  rhonchi.  HEART:  Regular rate and  rhythm without murmurs, gallops, or rubs.  ABDOMEN:  Soft, nontender.  Positive bowel sounds.  SKIN:  Notable for ecchymosis in arms and legs, particularly right knee  with abrasion.  EXTREMITIES:  Right knee painful for manipulation.  Some edema  bilaterally in lower extremities trace to 1+.  NEUROLOGIC:  Cranial nerves II through XII intact.  Reflexes generally  1+ throughout except ankle which are trace.  Sensation is trace in  bilateral toes as well as dorsum of feet.  She has some sensory loss in  L5 dermatome on the right.  Strength is generally 4/5 in upper  extremity, left lower extremity.  She is 3/5 proximally, 4/5 distally  right lower extremity.  She is 3/5 at hip, 2/5 at knee, 5/5 plantar  flexion,  1+/5 ankle dorsiflexion.  This is being component to her right  lower extremity weakness.  Judgment, orientation, memory, mood are all  within functional limits.    HOSPITAL COURSE:  [name] was admitted to rehab on March 26, 2009, for inpatient therapies to consist of PT/OT at least 3 hours 5  days a week.  Past admission physiatrist, rehab RN, and therapy team  have worked together to provide customized collaborative  interdisciplinary care.  Rehab RN has been working with the patient on  the skin care to prevent breakdown.  They have also been assisting the  patient in the physical therapy sessions on premedicating the patient  prior to therapy sessions to help improve her participation in therapy.  The patient's blood pressures have been checked on b.i.d. basis during  this stay.  These have shown reasonable control ranging from 120s to  150s systolics, 60s to 80s diastolics.  The patient has been afebrile  during the stay.  Labs done past admission revealed hemoglobin 13.1,  hematocrit 39.2, white count 6.8, platelets 185.  Check of lytes  revealed sodium 141, potassium 4.1, chloride 105, CO2 of 28, BUN 11,  creatinine 0.76, glucose 180.  LFTs showed low protein stores, a total  protein 5.9, albumin 3.1, AST 19, ALT 25.  The patient was limited by  neuropathy throughout the stay.  He was started on a low-dose Neurontin  with dose slowly titrated to 400 mg t.i.d.  The patient has been able to  tolerate this without any complaints of dizziness.  She has had issues  with elevated level of anxiety that are improving by the time of  discharge.  The patient's blood sugars were monitored on a.c., h.s.  basis.  Blood sugars ranging from 110s to 150s overall.  The patient was  noted to have some hypoglycemic episodes and meal coverage at lunch was  placed on hold.   During the patient's stay in rehab, weekly team conferences were held to  monitor the patient's progress, set goals, as well as discuss barriers  to discharge.  At the time of admission, the patient was limited by pain  in low back, radicular pain in bilateral lower extremities,  decreased in  activity tolerance for endurance, impaired sensation bilateral lower  extremities, as well as decreased strength and range of motion in  bilateral lower extremities.  The patient was mod to max assist overall  on her evaluation.  She also reported elevated anxiety levels in using  assistive device for attempts at ambulation due to her recent falls.  Physical therapy has been working with the patient on lower extremity  exercise group to help with strengthening as well as to increase  functional joint motion and decreased pain for transfers and ambulation.  Initially, the patient required increased rest breaks during her  exercise sessions.  The patient also with limitation in mobility due to  pain in posterior lower extremities due to neuropathy.  She was also  highly anxious about not having assistance at home.  The patient's  friends did offer to provide their home as well as provide 24-hour  supervision assist past discharge and this helped overall outlook and  participation in therapy.  The patient was also fitted for double-  upright AFO for right footdrop.  By the time of discharge, the patient  was at supervision for squat pivot transfers from bed to wheelchair.  She was able to perform car transfers, squat pivot with assist to lift  lower extremity in and out of the car.  Gait training was done with  right AFO due to the patient's elevated anxiety levels.  She was able to  ambulate 5 feet x2 with total assist in care.  Caregiver education was  done with the patient's friend to involve the bed mobility with emphasis  on back precautions, practicing transfers from bed to chair as well as  car transfers.  ADL re-training at sink has focused on tolerating  sitting at the edge of the sink for self-care.  They have also been  working with the patient on to attempt lateral lean for peri-care as  well as lower extremity dressing.  The patient was able to perform  toilet  transfers with supervision level.  She did continue to required  mod assist for donning pants with the use of adaptic equipment.  She  requires mod assist for toileting.  The patient's friends were educated  regarding ADL needs as well as toileting.  The patient was offered the  option of going to skilled nursing facility; however, she declined this.  As the patient's friends have stepped up to provide assist, the patient  is discharged to home with progressive PT/OT by [facility] past  discharge.   DISCHARGE MEDICATIONS:  1. Prozac 20 mg a day.  2. Nortriptyline 25 mg nightly.  3. Reglan 10 mg nightly.  4. Prilosec 20 mg a day.  5. Allegra 180 mg a day.  6. Librium 5 mg a day.  7. Oxycodone 5 mg 1 q.4 h. p.r.n. moderate-to-severe pain, #90 Rx.  8. Lipitor 80 mg nightly.  9. Zetia 10 mg a day.  10.Lodine 500 mg b.i.d.  11.Evista 60 hours a day.  12.Os-Cal Plus D b.i.d.  13.Toprol-XL 25 mg a day.  14.Ultram 50 mg q.i.d. p.r.n. mild pain.  15.Neurontin 200 mg 2 pills p.o. q.8 h.  16.Flonase in nostrils daily.   Diet is diabetic diet.   Activity level is 24-hour supervision assistance.  No strenuous  activity.  No alcohol, no smoking, no driving.   SPECIAL INSTRUCTIONS:  Check blood sugars a.c., h.s.  A new sliding  scale is at home.  Do not use Prinizide 20/25.  [facility] to  provide PT and OT.   FOLLOWUP:  The patient to follow up with Dr. Riley Kill as needed; follow up  with Dr. Theresia Lo for routine check in the next few weeks; follow up  with Dr. Channing Mutters in 2 weeks.      Greg Cutter, P.A.      Ranelle Oyster, M.D.  Electronically Signed   PP/MEDQ  D:  04/17/2009  T:  04/18/2009  Job:  [id number]   cc:  Vikki Ports, M.D.  Payton Doughty, M.D.

## 2011-03-01 NOTE — Discharge Summary (Signed)
NAMESHERRINA, [name]              ACCOUNT NO.:  [id number]   MEDICAL RECORD NO.:  [id number]          PATIENT TYPE:  INP   LOCATION:  1616                         [facility]   PHYSICIAN:  [name], MDDATE OF BIRTH:  [birth year]   DATE OF ADMISSION:  05/26/2008  DATE OF DISCHARGE:  05/30/2008                               DISCHARGE SUMMARY   DISCHARGE DIAGNOSES:  1. Acute L4 compression fracture.  2. Old L1 compression fracture.  3. Diabetes.  4. Degenerative disk disease with chronic back pain.  5. Hypertension.  6. Right leg pain secondary to radiculopathy.  7. Gastroesophageal reflux disease.  8. Hyperlipidemia.  9. Osteoporosis.  10.Constipation.  11.Urinary tract infection Enterococcus species.   DISCHARGE MEDICATIONS:  1. Lodine 500 mg p.o. b.i.d.  2. Prilosec 20 mg a day.  3. Reglan 10 mg nightly.  4. Prozac 20 mg a day.  5. Prinivil 20 mg a day.  6. Lipitor 80 mg a day.  7. Pamelor 25 mg nightly.  8. Librium 5 mg p.o. b.i.d.  9. Allegra 180 mg a day.  10.Flonase 2 sprays per nostril daily.  11.Evista 60 mg a day.  12.Metoprolol 12.5 mg p.o. b.i.d.  13.NPH insulin 70 units nightly.  14.MiraLax 17 g p.o. daily, hold for diarrhea.  15.Dulcolax suppository daily as needed for constipation.  16.Ciprofloxacin 500 mg p.o. b.i.d. until August 15 then stop.  17.Calcium with vitamin D 500 mg p.o. t.i.d.  18.Oxycodone 10 mg p.o. b.i.d., hold for sedation.  19.Dilaudid 2 mg p.o. q.4 hours p.r.n. pain.  20.Tylenol 650 mg p.o. q.4 hours p.r.n. pain.   CONDITION:  Stable.   ACTIVITY:  She is to wear her TLSO brace when up with physical therapy  and occupational therapy.   CONSULTATIONS:  Dr. Ranell Patrick and Dr. Lowella Dandy.   DIET:  Should be diabetic, low-salt.   FOLLOWUP:  With Dr. Marin Roberts in 2-3 weeks.  His office will  call to schedule appointment.   PROCEDURES:  None.   LABS:  CBC unremarkable.  Complete metabolic panel on admission  significant for a glucose of 48, BUN of 20, creatinine 1.4.  At  discharge her glucose was 138, her BUN was 8 and creatinine 1.  LFTs  significant for an albumin of 2.7, otherwise unremarkable.  Urinalysis  on admission showed large ketones, negative blood, negative nitrite,  negative leukocyte esterase.  She had a repeat urinalysis on August 12  which was turbid, large blood, 30 protein, positive nitrite, large  leukocyte esterase, too numerous to count white cells, too numerous to  count red cells, many bacteria.  Urine culture from August 10 grew out  greater than 100,000 colonies of Enterococcus species which was  sensitive to ampicillin, levofloxacin, nitrofurantoin and vancomycin.   SPECIAL STUDIES/RADIOLOGY:  CT of the brain showed nothing acute  intracranially and no fracture.  Right mastoid opacification.  Lumbar x-  ray showed high suspicion of an acute inferior endplate compression  fracture at L1 with mild retropulsion.  Possible second fracture of the  inferior endplate of L4.  Also possibly with some retropulsion.  Degenerative  disk disease L5-S1 and degenerative facet disease in the  lower lumbar spine.  CT of the lumbar spine showed L1 and L4 compression  fractures with mild retropulsion but no significant canal compromise,  combination of diffuse annular bulge and osteophytic bridging with mild  spinal and lateral recess stenosis at L2-3 and L3-4.  MRI of the lumbar  spine showed acute compression fracture at the inferior endplate of L4,  posterior bowing of the posterior margin of the vertebral body,  potential for neural compression in the lateral recesses, old healed  inferior endplate fracture of L1, right posterolateral predominant  endplate osteophytes and disk herniation at L5-S1, potential for right-  sided neural compression.  EKG showed normal sinus rhythm.   HISTORY AND HOSPITAL COURSE:  [name] is a 77 year old white female  patient of Dr. Theresia Lo who  fell and had persistent back pain.  She had  presented to the emergency room the day prior to admission and found to  have lumbar compression fractures.  She lives alone.  She was sent home  with pain medications but returned as she was unable to ambulate or care  for herself.  Orthopedics was consulted by the ED physician.  They felt  that the patient would not need operative management and recommended a  TLSO brace, pain control, physical therapy, occupational therapy and  admission to internal medicine then subsequently skilled nursing  facility placement.  The patient received Vicodin and continued to have  difficulty ambulating due to the pain.  She was not progressing well and  therefore interventional radiology was consulted to evaluate for  appropriateness of vertebroplasty.  An MRI was done.  The patient had  complaints also of right leg shooting pains.  There was concern that a  vertebroplasty may not help with her back pain and may even make the  radiculopathy worse.  Therefore, interventional radiology recommended  conservative management and reassessment in their outpatient clinic in 2-  3 weeks.  She was started on a bowel regimen for constipation and her  pain was better controlled on OxyContin and Dilaudid.  She was  ambulating better with this pain regimen.  She developed dysuria and  foul-smelling urine.  Initially she had a clear  urinalysis but upon repeat she did show signs of infection.  She never  had a leukocytosis or fever but she was started on ciprofloxacin which  should be continued.  Her other medical problems remained stable during  the hospitalization.  Total time on the day of discharge is 40 minutes.  She is being discharged to skilled nursing facility.      Joanna L. Lendell Caprice, MD  Electronically Signed     CLS/MEDQ  D:  05/30/2008  T:  05/30/2008  Job:  [id number]   cc:   Vikki Ports, M.D.  Fax: [phone]   Marin Roberts, MD  Fax:  [phone]   Almedia Balls. Ranell Patrick, M.D.  Fax: [phone]

## 2011-03-01 NOTE — Consult Note (Signed)
[facility]                          ENDOCRINOLOGY CONSULTATION   [name], [name]                     MRN:          [id number]  DATE:03/07/2007                            DOB:          09/14/1934    REFERRING PHYSICIAN:  Vikki Ports, M.D.   REASON FOR REFERRAL:  Diabetes.   HISTORY OF PRESENT ILLNESS:  A 77 year old woman who reports an 11-year  history of diabetes.  She is unaware of any chronic complications.  She  has been on insulin for a few months and currently takes NovoLog 70/30,  45 units twice daily.  She states that her glucoses vary between 90 and  300 with no particular pattern throughout the year.  She describes her  diet as good and her exercise as poor, as limited by her low back  pain.  Symptomatically, she has gained 70 pounds in the past 11 years  and she has had a slight weight gain of 10 pounds in the past one year.  No associated numbness of the feet.   PAST MEDICAL HISTORY:  1. GERD.  2. Anxiety/depression.  3. Dyslipidemia.  4. Hypertension.  5. Allergic rhinitis.  6. Menopause.   SOCIAL HISTORY:  She is retired.  She is here with her husband.   FAMILY HISTORY:  Negative for diabetes in her immediate family.   REVIEW OF SYSTEMS:  Denies chest pain, syncope, shortness of breath,  nausea, vomiting, and hypoglycemia.   PHYSICAL EXAMINATION:  VITAL SIGNS:  Blood pressure 133/70, heart rate  111, temperature 97.7.  Weight is 209.  GENERAL:  Obese.  SKIN:  Not diaphoretic.  No rash.  HEENT:  No proptosis.  No periorbital swelling.  Pharynx is normal.  NECK:  Supple.  No goiter.  CHEST:  Clear to auscultation.  No respiratory distress.  CARDIOVASCULAR:  There is trace left-sided pretibial edema, 2+ on the  right.  Regular rate and rhythm.  No murmur.  Pedal pulses are intact.  There is no bruit at the carotid arteries.  EXTREMITIES:  Feet are of normal color and temperature except they are  quite dry.  No  ulcer is present.  NEUROLOGIC:  Alert and oriented.  Does note appear anxious or depressed.  Sensation is intact to touch on the feet.   Laboratory studies forwarded by Dr. Theresia Lo.  The most recent  hemoglobin A1C on October 30, 2006, 7.8.   IMPRESSION:  1. Diabetes of uncertain type.  No known complications.  Her glucoses      are variable on her current regimen.  2. Her therapy may be compromised by the fact that she may have type I      diabetes, complicated by obesity/insulin resistance.  Such patients      are difficult to control, especially over age 43.  3. Her low back pain and inability to exercise will limit her therapy      as well.   PLAN:  1. We discussed the importance risks of diabetes as well as the      importance of diet and exercise therapy, as best she can do.  2. We talked about different insulin regimens.  I told her that      ultimately, the best insulin regimen is that which is indicated by      the pattern of her home glucoses.  She states that she wants to go      in this direction.  I have prescribed her Humalog 20 breakfast, 10      lunch, 20 supper, and NPH 20 units nightly.  She states that she      has a limited ability to afford expensive medications, so I have      chosen the NPH over Lantus or Levemir.  I have told her that this      is merely a starting point and is very unlikely to be an optimal      insulin dosage.  She will return in two weeks and will make      necessary adjustments.  Once she is stabilized, she will not need      to come back here very often, if at all.     Sean A. Everardo All, MD  Electronically Signed    SAE/MedQ  DD: 03/07/2007  DT: 03/08/2007  Job #: [id number]   cc:   Vikki Ports, M.D.

## 2011-03-01 NOTE — Op Note (Signed)
NAMEROLENE, ANDRADES NO.:  [id number]   MEDICAL RECORD NO.:  [id number]          PATIENT TYPE:  INP   LOCATION:  3316                         FACILITY:  [facility]   PHYSICIAN:  Payton Doughty, M.D.      DATE OF BIRTH:  1934-01-14   DATE OF PROCEDURE:  03/06/2009  DATE OF DISCHARGE:                               OPERATIVE REPORT   PREOPERATIVE DIAGNOSIS:  Spinal stenosis and spondylosis at L4-5 and L5-  S1.   POSTOPERATIVE DIAGNOSIS:  Spinal stenosis and spondylosis at L4-5 and L5-  S1.   PROCEDURE:  L4-5, L5-S1 bilateral laminotomy and foraminotomy.   SURGEON:  Payton Doughty, M.D.   ANESTHESIA:  General endotracheal.   PREPARATION:  Prepped and draped with alcohol wipe.   COMPLICATIONS:  None.   NURSE ASSISTANT:  Bedelia Person, MD   DOCTOR ASSISTANT:  Stefani Dama, MD   BODY OF TEXT:  A 77 year old with L4 compression fracture and spinal  stenosis at L4-5 and L5-S1, taken to operating room, smoothly  anesthetized and intubated, placed prone on the operating table.  Following shave, prep and drape in the usual sterile fashion, old skin  incision was reopened.  Through it was exposed the  L4 and L5 lamina.  The intraoperative x-ray confirmed correctness level.  Laminectomy was  carried out at each level at L4 up to the top of ligamentum flavum that  was removed in a retrograde fashion at L5, and laminectomy was carried  out on each side such that complete decompression was achieved by  removal of both lamina and ligamentum flavum.  Working laterally at both  L4-5 and L5-S1, the remaining lamina were undercut and decompression of  the nerve root was thus achieved.   Palpation of the neural foramina revealed they were completely  decompressed.  Laminotomy defects were filled with Depo-Medrol soaked  fat.  Successive layers of 0 Vicryl, 2-0 Vicryl, and 3-0 nylon were used  to close.  Betadine and Telfa dressing was applied, made occlusive with  OpSite, and the  patient returned to recovery room in good condition.            ______________________________  Payton Doughty, M.D.    MWR/MEDQ  D:  03/06/2009  T:  03/07/2009  Job:  [id number]

## 2011-03-17 ENCOUNTER — Other Ambulatory Visit: Payer: Self-pay | Admitting: *Deleted

## 2011-03-17 MED ORDER — "INSULIN SYRINGE-NEEDLE U-100 31G X 5/16"" 1 ML MISC": Status: DC

## 2011-03-17 NOTE — Telephone Encounter (Signed)
R'cd fax from [facility] for refill of pt's insulin syringes.  Last OV-01/03/2011 Last Filled-02/08/2011

## 2011-04-04 ENCOUNTER — Other Ambulatory Visit: Payer: Self-pay | Admitting: *Deleted

## 2011-04-04 MED ORDER — "INSULIN SYRINGE-NEEDLE U-100 31G X 5/16"" 1 ML MISC": Status: DC

## 2011-04-04 MED ORDER — INSULIN NPH (HUMAN) (ISOPHANE) 100 UNIT/ML ~~LOC~~ SUSP: SUBCUTANEOUS | Status: DC

## 2011-04-04 NOTE — Telephone Encounter (Signed)
Rx faxed to [facility] 

## 2011-04-04 NOTE — Telephone Encounter (Signed)
R'cd fax from [facility] for refill of Humulin N insulin and insulin syringes  Last OV-01/03/2020  Last filled-02/01/2011

## 2011-05-02 ENCOUNTER — Telehealth: Payer: Self-pay | Admitting: *Deleted

## 2011-05-02 MED ORDER — INSULIN LISPRO 100 UNIT/ML ~~LOC~~ SOLN: SUBCUTANEOUS | Status: DC

## 2011-05-02 NOTE — Telephone Encounter (Signed)
Rx Done . 

## 2011-05-02 NOTE — Telephone Encounter (Signed)
FYI: Pt states that she received Humulin from [facility] but did not receive Humalog [do not see order for humalog]. Pt has scheduled OV with you tomorrow.

## 2011-05-02 NOTE — Telephone Encounter (Signed)
Ov is due--appt tomorrow is good. Please refill humalog x 1 year

## 2011-05-03 ENCOUNTER — Other Ambulatory Visit (INDEPENDENT_AMBULATORY_CARE_PROVIDER_SITE_OTHER): Payer: Medicare HMO

## 2011-05-03 ENCOUNTER — Encounter: Payer: Self-pay | Admitting: Endocrinology

## 2011-05-03 ENCOUNTER — Ambulatory Visit (INDEPENDENT_AMBULATORY_CARE_PROVIDER_SITE_OTHER): Payer: Medicare HMO | Admitting: Endocrinology

## 2011-05-03 VITALS — BP 136/88 | HR 118 | Temp 98.9°F | Ht 63.0 in | Wt 195.8 lb

## 2011-05-03 DIAGNOSIS — Z79899 Other long term (current) drug therapy: Secondary | ICD-10-CM

## 2011-05-03 DIAGNOSIS — E109 Type 1 diabetes mellitus without complications: Secondary | ICD-10-CM

## 2011-05-03 DIAGNOSIS — B351 Tinea unguium: Secondary | ICD-10-CM

## 2011-05-03 LAB — HEPATIC FUNCTION PANEL
ALT: 32 U/L (ref 0–35)
AST: 30 U/L (ref 0–37)
Bilirubin, Direct: 0.1 mg/dL (ref 0.0–0.3)
Total Bilirubin: 0.4 mg/dL (ref 0.3–1.2)

## 2011-05-03 LAB — MICROALBUMIN / CREATININE URINE RATIO
Microalb Creat Ratio: 2.4 mg/g (ref 0.0–30.0)
Microalb, Ur: 3.5 mg/dL — ABNORMAL HIGH (ref 0.0–1.9)

## 2011-05-03 MED ORDER — TERBINAFINE HCL 250 MG PO TABS: 250.0000 mg | ORAL_TABLET | Freq: Every day | ORAL | Status: DC

## 2011-05-03 NOTE — Progress Notes (Signed)
Subjective:    Patient ID: [name], female    DOB: 12-May-1934, 77 y.o.   MRN: [id number]  HPI Pt has been living at "[facility]  She takes her own meds, including insulin, but her meals are prepared and brought to her.  no cbg record, but states cbg's are well-controlled.  She has cbg as low as 80, when she takes her humalog, but does not eat.   She has few years of thickening of toenails, but no assoc pain. Past Medical History  Diagnosis Date  . Encounter for long-term (current) use of other medications     Anitihyperlipidemic use, Long term  . DIABETES MELLITUS, TYPE I 05/30/2007  . HYPERCHOLESTEROLEMIA 10/23/2008  . HYPERTENSION 05/30/2007  . OSTEOARTHRITIS, LUMBAR SPINE 12/25/2007    No past surgical history on file.  History   Social History  . Marital Status: Single    Spouse Name: N/A    Number of Children: N/A  . Years of Education: N/A   Occupational History  . Not on file.   Social History Main Topics  . Smoking status: Former Games developer  . Smokeless tobacco: Not on file  . Alcohol Use:   . Drug Use:   . Sexually Active:    Other Topics Concern  . Not on file   Social History Narrative   Separated 2010Lives alone    Current Outpatient Prescriptions on File Prior to Visit  Medication Sig Dispense Refill  . atorvastatin (LIPITOR) 80 MG tablet Take 80 mg by mouth daily.        . chlordiazePOXIDE (LIBRIUM) 5 MG capsule Take 5 mg by mouth 2 (two) times daily.        . fexofenadine (ALLEGRA) 180 MG tablet Take 180 mg by mouth daily.        Marland Kitchen FLUoxetine (PROZAC) 20 MG capsule Take 20 mg by mouth daily.        . fluticasone (FLONASE) 50 MCG/ACT nasal spray 1 spray by Nasal route at bedtime. In each nostril       . furosemide (LASIX) 20 MG tablet Take 40 mg by mouth every morning. 2 tablets in AM       . gabapentin (NEURONTIN) 400 MG capsule Take 400 mg by mouth every 8 (eight) hours.        Marland Kitchen glucose blood (ACCU-CHEK AVIVA) test  strip Use as instructed. Check blood sugar four times a day       . insulin lispro (HUMALOG) 100 UNIT/ML injection Inject into the skin 3 (three) times daily before meals. 45-40-50 units       . insulin NPH (HUMULIN N) 100 UNIT/ML injection Inject 60 units subcutaneously every night  30 mL  2  . Insulin Syringe-Needle U-100 (BD INSULIN SYRINGE ULTRAFINE) 31G X 5/16" 1 ML MISC Use as directed four times a day dx 250.01  300 each  2  . lisinopril (PRINIVIL,ZESTRIL) 20 MG tablet Take 20 mg by mouth every morning.        Marland Kitchen lisinopril-hydrochlorothiazide (PRINZIDE,ZESTORETIC) 20-25 MG per tablet Take 1 tablet by mouth daily.        . metoclopramide (REGLAN) 10 MG tablet Take 10 mg by mouth daily. Take 1 by mouth once daily       . metoprolol (TOPROL-XL) 25 MG 24 hr tablet Take 25 mg by mouth daily.        . nortriptyline (PAMELOR) 25 MG capsule Take 25 mg by mouth daily. Take 1 by mouth daily       .  omeprazole (PRILOSEC) 20 MG capsule Take 20 mg by mouth daily.        Marland Kitchen oxyCODONE-acetaminophen (PERCOCET) 5-325 MG per tablet Take 1 tablet by mouth daily. 1 or 2 tablets daily         Allergies  Allergen Reactions  . Baclofen   . Cefaclor   . Codeine   . Diclofenac Sodium   . Erythromycin   . Meperidine Hcl   . Propoxyphene N-Acetaminophen   . Sulfonamide Derivatives     Family History  Problem Relation Age of Onset  . Diabetes Neg Hx     No DM in her immdidate family    BP 136/88  Pulse 118  Temp(Src) 98.9 F (37.2 C) (Oral)  Ht 5\' 3"  (1.6 m)  Wt 195 lb 12.8 oz (88.814 kg)  BMI 34.68 kg/m2  SpO2 97%   Review of Systems Denies loc.  She has a few lbs of weight gain.    Objective:   Physical Exam Pulses:  dorsalis pedis intact bilat.  no carotid bruit Extremities:  no deformity.  no ulcer on the feet.  feet are of normal temp.   the right leg is slightly red, but not warm. the skin on the feet is very dry. there are bilat varicosities, and trace bilat leg edema mycotic  toenails.   Neurologic:  sensation is intact to touch on the feet.    Lab Results  Component Value Date   HGBA1C 8.1* 05/03/2011   Lab Results  Component Value Date   ALT 32 05/03/2011   AST 30 05/03/2011   ALKPHOS 86 05/03/2011   BILITOT 0.4 05/03/2011    Assessment & Plan:  Dm, needs increased rx Onychomycosis.

## 2011-05-03 NOTE — Patient Instructions (Addendum)
blood tests are being ordered for you today.  please call [phone] to hear your test results.  You will be prompted to enter the 9-digit "MRN" number that appears at the top left of this page, followed by #.  Then you will hear the message. check your blood sugar 2 times a day.  vary the time of day when you check, between before the 3 meals, and at bedtime.  also check if you have symptoms of your blood sugar being too high or too low.  please keep a record of the readings and bring it to your next appointment here.  please call us sooner if you are having low blood sugar episodes. good diet and exercise habits significanly improve the control of your diabetes.  please let me know if you wish to be referred to a dietician.  high blood sugar is very risky to your health.  you should see an eye doctor every year. controlling your blood pressure and cholesterol drastically reduces the damage diabetes does to your body.  this also applies to quitting smoking.  please discuss these with your doctor.  you should take an aspirin every day, unless you have been advised by a doctor not to. Please make a follow-up appointment in 4 months.  i have sent a prescription to your pharmacy for a pill to kill the foot fungus.

## 2011-05-05 ENCOUNTER — Telehealth: Payer: Self-pay | Admitting: *Deleted

## 2011-05-05 DIAGNOSIS — B351 Tinea unguium: Secondary | ICD-10-CM | POA: Insufficient documentation

## 2011-05-05 NOTE — Telephone Encounter (Signed)
Problem w/order [facility] per Pt. Called pharmacy [Crystal/pharmacy tech] and corrected issue w/Insulin order & Lamisil was shipped on 05/05/11 Pt informed.

## 2011-06-23 ENCOUNTER — Other Ambulatory Visit: Payer: Self-pay | Admitting: *Deleted

## 2011-06-23 MED ORDER — INSULIN LISPRO 100 UNIT/ML ~~LOC~~ SOLN: SUBCUTANEOUS | Status: DC

## 2011-06-23 NOTE — Telephone Encounter (Signed)
R'cd fax from [facility] for refill of pt's insulin  Last OV-05/03/2011

## 2011-07-15 LAB — URINALYSIS, ROUTINE W REFLEX MICROSCOPIC
Glucose, UA: 100 — AB
Glucose, UA: NEGATIVE
Glucose, UA: NEGATIVE
Hgb urine dipstick: NEGATIVE
Hgb urine dipstick: NEGATIVE
Ketones, ur: NEGATIVE
Protein, ur: 30 — AB
Protein, ur: NEGATIVE
Protein, ur: NEGATIVE
Specific Gravity, Urine: 1.007
Urobilinogen, UA: 0.2
pH: 5.5
pH: 6

## 2011-07-15 LAB — COMPREHENSIVE METABOLIC PANEL
Alkaline Phosphatase: 69
BUN: 11
Chloride: 102
Creatinine, Ser: 1.03
Glucose, Bld: 221 — ABNORMAL HIGH
Potassium: 4.1
Total Bilirubin: 0.7
Total Protein: 5.5 — ABNORMAL LOW

## 2011-07-15 LAB — BASIC METABOLIC PANEL
CO2: 27
Calcium: 8.9
Chloride: 109
GFR calc Af Amer: 60 — ABNORMAL LOW
GFR calc non Af Amer: 49 — ABNORMAL LOW
Glucose, Bld: 138 — ABNORMAL HIGH
Glucose, Bld: 212 — ABNORMAL HIGH
Potassium: 4.1
Potassium: 4.4
Sodium: 132 — ABNORMAL LOW
Sodium: 140

## 2011-07-15 LAB — GLUCOSE, CAPILLARY
Glucose-Capillary: 102 — ABNORMAL HIGH
Glucose-Capillary: 116 — ABNORMAL HIGH
Glucose-Capillary: 139 — ABNORMAL HIGH
Glucose-Capillary: 152 — ABNORMAL HIGH
Glucose-Capillary: 162 — ABNORMAL HIGH
Glucose-Capillary: 164 — ABNORMAL HIGH
Glucose-Capillary: 195 — ABNORMAL HIGH
Glucose-Capillary: 201 — ABNORMAL HIGH

## 2011-07-15 LAB — POCT I-STAT, CHEM 8
BUN: 20
Chloride: 105
Creatinine, Ser: 1.4 — ABNORMAL HIGH
Potassium: 3.6
Sodium: 135
TCO2: 21

## 2011-07-15 LAB — DIFFERENTIAL
Basophils Absolute: 0
Eosinophils Relative: 1
Lymphocytes Relative: 13
Lymphs Abs: 1.2
Monocytes Absolute: 1.1 — ABNORMAL HIGH
Neutro Abs: 6.7

## 2011-07-15 LAB — URINE MICROSCOPIC-ADD ON

## 2011-07-15 LAB — CBC
HCT: 36.4
HCT: 37.4
Hemoglobin: 12
Hemoglobin: 12.4
MCHC: 33
MCV: 91.4
RBC: 4.11
RDW: 14.1
RDW: 14.3
WBC: 9.1

## 2011-07-15 LAB — URINE CULTURE: Colony Count: >=100000

## 2011-08-30 ENCOUNTER — Other Ambulatory Visit (INDEPENDENT_AMBULATORY_CARE_PROVIDER_SITE_OTHER): Payer: Medicare HMO

## 2011-08-30 ENCOUNTER — Ambulatory Visit (INDEPENDENT_AMBULATORY_CARE_PROVIDER_SITE_OTHER): Payer: Medicare HMO | Admitting: Endocrinology

## 2011-08-30 ENCOUNTER — Encounter: Payer: Self-pay | Admitting: Endocrinology

## 2011-08-30 VITALS — BP 134/70 | HR 103 | Temp 98.2°F | Ht 63.0 in | Wt 204.1 lb

## 2011-08-30 DIAGNOSIS — E109 Type 1 diabetes mellitus without complications: Secondary | ICD-10-CM

## 2011-08-30 LAB — HEMOGLOBIN A1C: Hgb A1c MFr Bld: 7.6 % — ABNORMAL HIGH (ref 4.6–6.5)

## 2011-08-30 NOTE — Progress Notes (Signed)
Subjective:    Patient ID: [name], female    DOB: [birth year], 77 y.o.   MRN: [id number]  HPI Pt lives at [facility].  no cbg record, but states cbg has been low twice since last ov.  Both times were before breakfast.  She says cbg is highest at hs (high-100's).  pt states she feels well in general. Past Medical History  Diagnosis Date  . Encounter for long-term (current) use of other medications     Anitihyperlipidemic use, Long term  . DIABETES MELLITUS, TYPE I 05/30/2007  . HYPERCHOLESTEROLEMIA 10/23/2008  . HYPERTENSION 05/30/2007  . OSTEOARTHRITIS, LUMBAR SPINE 12/25/2007    No past surgical history on file.  History   Social History  . Marital Status: Single    Spouse Name: N/A    Number of Children: N/A  . Years of Education: N/A   Occupational History  . Not on file.   Social History Main Topics  . Smoking status: Former Games developer  . Smokeless tobacco: Not on file  . Alcohol Use:   . Drug Use:   . Sexually Active:    Other Topics Concern  . Not on file   Social History Narrative   Separated 2010Lives alone    Current Outpatient Prescriptions on File Prior to Visit  Medication Sig Dispense Refill  . atorvastatin (LIPITOR) 80 MG tablet Take 80 mg by mouth daily.        . chlordiazePOXIDE (LIBRIUM) 5 MG capsule Take 5 mg by mouth 2 (two) times daily.        . fexofenadine (ALLEGRA) 180 MG tablet Take 180 mg by mouth daily.        Marland Kitchen FLUoxetine (PROZAC) 20 MG capsule Take 20 mg by mouth daily.        . fluticasone (FLONASE) 50 MCG/ACT nasal spray 1 spray by Nasal route at bedtime. In each nostril       . gabapentin (NEURONTIN) 400 MG capsule Take 400 mg by mouth every 8 (eight) hours.        Marland Kitchen glucose blood (ACCU-CHEK AVIVA) test strip Use as instructed. Check blood sugar four times a day       . insulin lispro (HUMALOG) 100 UNIT/ML injection Inject into the skin 3 (three) times daily before meals. 45-40-60 units       . Insulin Syringe-Needle U-100 (BD  INSULIN SYRINGE ULTRAFINE) 31G X 5/16" 1 ML MISC Use as directed four times a day dx 250.01  300 each  2  . lisinopril (PRINIVIL,ZESTRIL) 20 MG tablet Take 20 mg by mouth every morning.        Marland Kitchen lisinopril-hydrochlorothiazide (PRINZIDE,ZESTORETIC) 20-25 MG per tablet Take 1 tablet by mouth daily.        . metoclopramide (REGLAN) 10 MG tablet Take 10 mg by mouth daily. Take 1 by mouth once daily       . metoprolol (TOPROL-XL) 25 MG 24 hr tablet Take 25 mg by mouth daily.        . nortriptyline (PAMELOR) 25 MG capsule Take 25 mg by mouth daily. Take 1 by mouth daily       . omeprazole (PRILOSEC) 20 MG capsule Take 20 mg by mouth daily.        Marland Kitchen oxyCODONE-acetaminophen (PERCOCET) 5-325 MG per tablet Take 1 tablet by mouth daily. 1 or 2 tablets daily       . furosemide (LASIX) 20 MG tablet Take 40 mg by mouth every morning. 2 tablets in AM  Allergies  Allergen Reactions  . Baclofen   . Cefaclor   . Codeine   . Diclofenac Sodium   . Erythromycin   . Meperidine Hcl   . Propoxyphene N-Acetaminophen   . Sulfonamide Derivatives     Family History  Problem Relation Age of Onset  . Diabetes Neg Hx     No DM in her immdidate family    BP 134/70  Pulse 103  Temp(Src) 98.2 F (36.8 C) (Oral)  Ht 5\' 3"  (1.6 m)  Wt 204 lb 2 oz (92.59 kg)  BMI 36.16 kg/m2  SpO2 94%  Review of Systems Denies loc    Objective:   Physical Exam VITAL SIGNS:  See vs page GENERAL: no distress. Pulses: dorsalis pedis intact bilat.   Feet: no deformity.  no ulcer on the feet.  feet are of normal color and temp.  no edema.  There is bilateral onychomycosis.   Skin is very dry Neuro: sensation is intact to touch on the feet    Lab Results  Component Value Date   HGBA1C 7.6* 08/30/2011      Assessment & Plan:  DM, Needs increased rx, if it can be done with a regimen that avoids or minimizes hypoglycemia.

## 2011-08-30 NOTE — Patient Instructions (Addendum)
blood tests are being ordered for you today.  please call [phone] to hear your test results.  You will be prompted to enter the 9-digit "MRN" number that appears at the top left of this page, followed by #.  Then you will hear the message. pending the test results, please reduce the nph insulin to 50 units at bedtime.  Increase humalog to (just before each meal) 45-40-60 units. check your blood sugar 2 times a day.  vary the time of day when you check, between before the 3 meals, and at bedtime.  also check if you have symptoms of your blood sugar being too high or too low.  please keep a record of the readings and bring it to your next appointment here.  please call us sooner if you are having low blood sugar episodes. Please make a follow-up appointment in 4 months.

## 2011-10-29 ENCOUNTER — Encounter: Payer: Self-pay | Admitting: Internal Medicine

## 2011-10-29 DIAGNOSIS — Z0000 Encounter for general adult medical examination without abnormal findings: Secondary | ICD-10-CM | POA: Insufficient documentation

## 2011-10-29 DIAGNOSIS — D649 Anemia, unspecified: Secondary | ICD-10-CM | POA: Insufficient documentation

## 2011-10-29 DIAGNOSIS — S22080A Wedge compression fracture of T11-T12 vertebra, initial encounter for closed fracture: Secondary | ICD-10-CM | POA: Insufficient documentation

## 2011-10-29 DIAGNOSIS — K219 Gastro-esophageal reflux disease without esophagitis: Secondary | ICD-10-CM

## 2011-10-29 DIAGNOSIS — M51369 Other intervertebral disc degeneration, lumbar region without mention of lumbar back pain or lower extremity pain: Secondary | ICD-10-CM | POA: Insufficient documentation

## 2011-10-29 DIAGNOSIS — I5032 Chronic diastolic (congestive) heart failure: Secondary | ICD-10-CM | POA: Insufficient documentation

## 2011-10-29 DIAGNOSIS — J309 Allergic rhinitis, unspecified: Secondary | ICD-10-CM

## 2011-10-29 DIAGNOSIS — F419 Anxiety disorder, unspecified: Secondary | ICD-10-CM | POA: Insufficient documentation

## 2011-10-29 DIAGNOSIS — I2510 Atherosclerotic heart disease of native coronary artery without angina pectoris: Secondary | ICD-10-CM | POA: Insufficient documentation

## 2011-10-29 DIAGNOSIS — M48061 Spinal stenosis, lumbar region without neurogenic claudication: Secondary | ICD-10-CM | POA: Insufficient documentation

## 2011-10-29 DIAGNOSIS — M810 Age-related osteoporosis without current pathological fracture: Secondary | ICD-10-CM | POA: Insufficient documentation

## 2011-10-29 DIAGNOSIS — M5136 Other intervertebral disc degeneration, lumbar region: Secondary | ICD-10-CM | POA: Insufficient documentation

## 2011-10-29 DIAGNOSIS — R6251 Failure to thrive (child): Secondary | ICD-10-CM

## 2011-10-29 DIAGNOSIS — F32A Depression, unspecified: Secondary | ICD-10-CM | POA: Insufficient documentation

## 2011-10-29 DIAGNOSIS — F329 Major depressive disorder, single episode, unspecified: Secondary | ICD-10-CM | POA: Insufficient documentation

## 2011-10-29 DIAGNOSIS — M1711 Unilateral primary osteoarthritis, right knee: Secondary | ICD-10-CM

## 2011-10-29 HISTORY — DX: Gastro-esophageal reflux disease without esophagitis: K21.9

## 2011-10-29 HISTORY — DX: Depression, unspecified: F32.A

## 2011-10-29 HISTORY — DX: Failure to thrive (child): R62.51

## 2011-10-29 HISTORY — DX: Allergic rhinitis, unspecified: J30.9

## 2011-10-29 HISTORY — DX: Unilateral primary osteoarthritis, right knee: M17.11

## 2011-11-01 ENCOUNTER — Ambulatory Visit (INDEPENDENT_AMBULATORY_CARE_PROVIDER_SITE_OTHER): Payer: Medicare Other | Admitting: Internal Medicine

## 2011-11-01 ENCOUNTER — Other Ambulatory Visit: Payer: Self-pay | Admitting: Internal Medicine

## 2011-11-01 ENCOUNTER — Other Ambulatory Visit: Payer: Self-pay

## 2011-11-01 ENCOUNTER — Encounter: Payer: Self-pay | Admitting: Internal Medicine

## 2011-11-01 VITALS — BP 142/80 | HR 99 | Temp 97.0°F | Ht 63.0 in | Wt 198.0 lb

## 2011-11-01 DIAGNOSIS — E7800 Pure hypercholesterolemia, unspecified: Secondary | ICD-10-CM

## 2011-11-01 DIAGNOSIS — I10 Essential (primary) hypertension: Secondary | ICD-10-CM

## 2011-11-01 DIAGNOSIS — G8929 Other chronic pain: Secondary | ICD-10-CM | POA: Insufficient documentation

## 2011-11-01 DIAGNOSIS — R5383 Other fatigue: Secondary | ICD-10-CM

## 2011-11-01 DIAGNOSIS — F419 Anxiety disorder, unspecified: Secondary | ICD-10-CM

## 2011-11-01 DIAGNOSIS — Z860100 Personal history of colon polyps, unspecified: Secondary | ICD-10-CM

## 2011-11-01 DIAGNOSIS — E109 Type 1 diabetes mellitus without complications: Secondary | ICD-10-CM

## 2011-11-01 DIAGNOSIS — F99 Mental disorder, not otherwise specified: Secondary | ICD-10-CM

## 2011-11-01 HISTORY — DX: Personal history of colonic polyps: Z86.010

## 2011-11-01 HISTORY — DX: Other chronic pain: G89.29

## 2011-11-01 HISTORY — DX: Personal history of colon polyps, unspecified: Z86.0100

## 2011-11-01 MED ORDER — OMEPRAZOLE 20 MG PO CPDR: 20.0000 mg | DELAYED_RELEASE_CAPSULE | Freq: Every day | ORAL | Status: DC

## 2011-11-01 MED ORDER — CHLORDIAZEPOXIDE HCL 5 MG PO CAPS: 5.0000 mg | ORAL_CAPSULE | Freq: Two times a day (BID) | ORAL | Status: DC | PRN

## 2011-11-01 MED ORDER — OXYCODONE-ACETAMINOPHEN 5-325 MG PO TABS: 1.0000 | ORAL_TABLET | Freq: Four times a day (QID) | ORAL | Status: DC | PRN

## 2011-11-01 MED ORDER — ROSUVASTATIN CALCIUM 20 MG PO TABS: 20.0000 mg | ORAL_TABLET | Freq: Every day | ORAL | Status: DC

## 2011-11-01 MED ORDER — NORTRIPTYLINE HCL 25 MG PO CAPS: 25.0000 mg | ORAL_CAPSULE | Freq: Every day | ORAL | Status: DC

## 2011-11-01 MED ORDER — METOPROLOL SUCCINATE ER 25 MG PO TB24: 25.0000 mg | ORAL_TABLET | Freq: Every day | ORAL | Status: DC

## 2011-11-01 MED ORDER — FEXOFENADINE HCL 180 MG PO TABS: 180.0000 mg | ORAL_TABLET | Freq: Every day | ORAL | Status: DC

## 2011-11-01 MED ORDER — LISINOPRIL 20 MG PO TABS: 20.0000 mg | ORAL_TABLET | ORAL | Status: DC

## 2011-11-01 MED ORDER — METOCLOPRAMIDE HCL 10 MG PO TABS: 10.0000 mg | ORAL_TABLET | Freq: Every day | ORAL | Status: DC

## 2011-11-01 MED ORDER — FLUTICASONE PROPIONATE 50 MCG/ACT NA SUSP: 1.0000 | Freq: Every day | NASAL | Status: DC

## 2011-11-01 MED ORDER — GABAPENTIN 400 MG PO CAPS: 400.0000 mg | ORAL_CAPSULE | Freq: Three times a day (TID) | ORAL | Status: DC

## 2011-11-01 MED ORDER — FLUOXETINE HCL 20 MG PO CAPS: 20.0000 mg | ORAL_CAPSULE | Freq: Every day | ORAL | Status: DC

## 2011-11-01 NOTE — Assessment & Plan Note (Signed)
stable overall by hx and exam, and pt to continue medical treatment as before, for refill benzo today

## 2011-11-01 NOTE — Assessment & Plan Note (Addendum)
stable overall by hx and exam,  and pt to continue medical treatment as before - for refill today

## 2011-11-01 NOTE — Assessment & Plan Note (Signed)
Etiology unclear, Exam otherwise benign, to check labs as documented, follow with expectant management, but pt declines labs to be done until mar 14 on the date she sees endo to avoid more than one blood draw which is dificult for her

## 2011-11-01 NOTE — Assessment & Plan Note (Signed)
stable overall by hx and exam, most recent data reviewed with pt, and pt to continue medical treatment as before  BP Readings from Last 3 Encounters:  11/01/11 142/80  08/30/11 134/70  05/03/11 136/88

## 2011-11-01 NOTE — Patient Instructions (Signed)
You are given the medication refills in hardcopy that you requested today Continue all other medications as before Please have the pharmacy call if you need other refills; there are no changes today You are up to date with prevention Please keep your appointments with your specialists as you have planned - Dr Everardo All Mar 14 We will plan on blood work to be done that day after you see Dr Everardo All

## 2011-11-01 NOTE — Assessment & Plan Note (Signed)
stable overall by hx and exam, most recent data reviewed with pt, and pt to continue medical treatment as before Lab Results  Component Value Date   [facility]  Value: 48        Total Cholesterol/HDL:CHD Risk Coronary Heart Disease Risk Table                     Men   Women  1/2 Average Risk   3.4   3.3  Average Risk       5.0   4.4  2 X Average Risk   9.6   7.1  3 X Average Risk  23.4   11.0        Use the calculated Patient Ratio above and the CHD Risk Table to determine the patient's CHD Risk.        ATP III CLASSIFICATION (LDL):  <100     mg/dL   Optimal  161-096  mg/dL   Near or Above                    Optimal  130-159  mg/dL   Borderline  045-409  mg/dL   High  >811     mg/dL   Very High 91/47/8295

## 2011-11-01 NOTE — Progress Notes (Signed)
Subjective:    Patient ID: Scot Jun, female    DOB: Dec 04, 1933, 78 y.o.   MRN: [id number]  HPI  Here to establish as new pt in transfer from prior care of [facility] who declined to further refill her percocet/librium on chronic basis.  Pt with chronic back pain, s/p 6 surguries to lumbar (plate and 2 rods per pt), last surgury approx 3 yrs, details unclear but per retired local NS, on ongoing percocet regular basis for last 2 yrs per NS but now retired.  Has been stable on pain med for this time.  Takes the librium up to twice per day prn for anxiety and sleep, ongoing for at least 10 yrs,  Pt denies chest pain, increased sob or doe, wheezing, orthopnea, PND, increased LE swelling, palpitations, dizziness or syncope.  Pt denies new neurological symptoms such as new headache, or facial or extremity weakness or numbness   Pt denies polydipsia, polyuria, or low sugar symptoms such as weakness or confusion improved with po intake.  Pt states overall good compliance with meds, trying to follow lower cholesterol, diabetic diet, wt overall stable but little exercise however.  Does have sense of ongoing fatigue, but denies signficant hypersomnolence. Past Medical History  Diagnosis Date  . Encounter for long-term (current) use of other medications     Anitihyperlipidemic use, Long term  . DIABETES MELLITUS, TYPE I 05/30/2007  . HYPERCHOLESTEROLEMIA 10/23/2008  . HYPERTENSION 05/30/2007  . OSTEOARTHRITIS, LUMBAR SPINE 12/25/2007  . Depression 10/29/2011  . GERD (gastroesophageal reflux disease) 10/29/2011  . Diastolic CHF, chronic   . CAD (coronary artery disease)   . Anxiety problem   . Wedge compression fracture of T11-T12 vertebra   . Lumbar degenerative disc disease   . Anemia, unspecified   . Lumbar spinal stenosis   . Allergic rhinitis, cause unspecified 10/29/2011  . Osteoporosis 10/29/2011  . Failure to thrive 10/29/2011  . Osteoarthritis of right knee 10/29/2011  . History of colon polyps  11/01/2011   Past Surgical History  Procedure Date  . Abdominal hysterectomy   . Cholecystectomy   . Appendectomy   . Back surgury     reports that she has quit smoking. She does not have any smokeless tobacco history on file. Her alcohol and drug histories not on file. family history is negative for Diabetes. Allergies  Allergen Reactions  . Ace Inhibitors   . Baclofen   . Cefaclor   . Chlorzoxazone   . Cloxacillin   . Codeine   . Diclofenac Sodium   . Erythromycin   . Meperidine Hcl   . Nsaids   . Promethazine   . Propoxyphene N-Acetaminophen   . Sulfonamide Derivatives    Current Outpatient Prescriptions on File Prior to Visit  Medication Sig Dispense Refill  . fexofenadine (ALLEGRA) 180 MG tablet Take 180 mg by mouth daily.        Marland Kitchen FLUoxetine (PROZAC) 20 MG capsule Take 20 mg by mouth daily.        . fluticasone (FLONASE) 50 MCG/ACT nasal spray 1 spray by Nasal route at bedtime. In each nostril       . gabapentin (NEURONTIN) 400 MG capsule Take 400 mg by mouth every 8 (eight) hours.        Marland Kitchen glucose blood (ACCU-CHEK AVIVA) test strip Use as instructed. Check blood sugar four times a day       . insulin lispro (HUMALOG) 100 UNIT/ML injection Inject into the skin 3 (three) times daily before  meals. 45-40-60 units       . insulin NPH (HUMULIN N,NOVOLIN N) 100 UNIT/ML injection Inject 50 units subcutaneously every night       . Insulin Syringe-Needle U-100 (BD INSULIN SYRINGE ULTRAFINE) 31G X 5/16" 1 ML MISC Use as directed four times a day dx 250.01  300 each  2  . lisinopril (PRINIVIL,ZESTRIL) 20 MG tablet Take 20 mg by mouth every morning.        . metoclopramide (REGLAN) 10 MG tablet Take 10 mg by mouth daily. Take 1 by mouth once daily       . nortriptyline (PAMELOR) 25 MG capsule Take 25 mg by mouth daily. Take 1 by mouth daily       . atorvastatin (LIPITOR) 80 MG tablet Take 80 mg by mouth daily.        . furosemide (LASIX) 20 MG tablet Take 40 mg by mouth every  morning. 2 tablets in AM       . lisinopril-hydrochlorothiazide (PRINZIDE,ZESTORETIC) 20-25 MG per tablet Take 1 tablet by mouth daily.         Review of Systems Review of Systems  Constitutional: Negative for diaphoresis and unexpected weight change.  HENT: Negative for drooling and tinnitus.   Eyes: Negative for photophobia and visual disturbance.  Respiratory: Negative for choking and stridor.   Gastrointestinal: Negative for vomiting and blood in stool.  Genitourinary: Negative for hematuria and decreased urine volume.  Musculoskeletal: Negative for gait problem.  Skin: Negative for color change and wound.  Neurological: Negative for tremors and numbness.  Psychiatric/Behavioral: Negative for decreased concentration. The patient is not hyperactive.       Objective:   Physical Exam BP 142/80  Pulse 99  Temp(Src) 97 F (36.1 C) (Oral)  Ht 5\' 3"  (1.6 m)  Wt 198 lb (89.812 kg)  BMI 35.07 kg/m2  SpO2 93% Physical Exam  VS noted, not ill appearing Constitutional: Pt appears well-developed and well-nourished.  HENT: Head: Normocephalic.  Right Ear: External ear normal.  Left Ear: External ear normal.  Eyes: Conjunctivae and EOM are normal. Pupils are equal, round, and reactive to light.  Neck: Normal range of motion. Neck supple.  Cardiovascular: Normal rate and regular rhythm.   Pulmonary/Chest: Effort normal and breath sounds normal.  Abd:  Soft, NT, non-distended, + BS Neurological: Pt is alert. No cranial nerve deficit.  Skin: Skin is warm. No erythema.  Psychiatric: Pt behavior is normal. Thought content normal.     Assessment & Plan:

## 2011-11-01 NOTE — Assessment & Plan Note (Signed)
stable overall by hx and exam, most recent data reviewed with pt, and pt to continue medical treatment as before  Lab Results  Component Value Date   HGBA1C 7.6* 08/30/2011

## 2011-11-02 ENCOUNTER — Other Ambulatory Visit: Payer: Self-pay | Admitting: Internal Medicine

## 2011-12-21 ENCOUNTER — Telehealth: Payer: Self-pay

## 2011-12-21 MED ORDER — CLONAZEPAM 0.5 MG PO TABS: 0.5000 mg | ORAL_TABLET | Freq: Two times a day (BID) | ORAL | Status: DC | PRN

## 2011-12-21 NOTE — Telephone Encounter (Signed)
Ok to try change to klonopin, which is usually covered by insurance and does similar job -  Secondary school teacher to [employer]

## 2011-12-21 NOTE — Telephone Encounter (Signed)
Received PA for Chlordazepoxide 5 mg please advise to proceed with PA or offer alternative

## 2011-12-22 NOTE — Telephone Encounter (Signed)
Faxed hardcopy to pharmacy [facility]. Called patient to inform left message to call back

## 2011-12-22 NOTE — Telephone Encounter (Signed)
Called left message to call back 

## 2011-12-23 NOTE — Telephone Encounter (Signed)
Patient informed. 

## 2011-12-29 ENCOUNTER — Encounter: Payer: Self-pay | Admitting: Internal Medicine

## 2011-12-29 ENCOUNTER — Other Ambulatory Visit (INDEPENDENT_AMBULATORY_CARE_PROVIDER_SITE_OTHER): Payer: Medicare Other

## 2011-12-29 ENCOUNTER — Ambulatory Visit (INDEPENDENT_AMBULATORY_CARE_PROVIDER_SITE_OTHER): Payer: Medicare Other | Admitting: Endocrinology

## 2011-12-29 ENCOUNTER — Encounter: Payer: Self-pay | Admitting: Endocrinology

## 2011-12-29 VITALS — BP 138/72 | HR 97 | Temp 98.1°F | Ht 63.0 in | Wt 199.6 lb

## 2011-12-29 DIAGNOSIS — E109 Type 1 diabetes mellitus without complications: Secondary | ICD-10-CM

## 2011-12-29 DIAGNOSIS — R5381 Other malaise: Secondary | ICD-10-CM

## 2011-12-29 DIAGNOSIS — R209 Unspecified disturbances of skin sensation: Secondary | ICD-10-CM

## 2011-12-29 DIAGNOSIS — R5383 Other fatigue: Secondary | ICD-10-CM

## 2011-12-29 LAB — URINALYSIS, ROUTINE W REFLEX MICROSCOPIC
Hgb urine dipstick: NEGATIVE
Nitrite: NEGATIVE
Urobilinogen, UA: 0.2 (ref 0.0–1.0)

## 2011-12-29 LAB — MICROALBUMIN / CREATININE URINE RATIO
Creatinine,U: 146 mg/dL
Microalb, Ur: 1.6 mg/dL (ref 0.0–1.9)

## 2011-12-29 LAB — BASIC METABOLIC PANEL
BUN: 12 mg/dL (ref 6–23)
Chloride: 108 mEq/L (ref 96–112)
Creatinine, Ser: 0.8 mg/dL (ref 0.4–1.2)
GFR: 77.44 mL/min (ref >60.00)
Potassium: 4.2 mEq/L (ref 3.5–5.1)

## 2011-12-29 LAB — CBC WITH DIFFERENTIAL/PLATELET
Basophils Relative: 0.3 % (ref 0.0–3.0)
Eosinophils Absolute: 0.1 10*3/uL (ref 0.0–0.7)
Eosinophils Relative: 0.9 % (ref 0.0–5.0)
Hemoglobin: 13.8 g/dL (ref 12.0–15.0)
MCHC: 32.2 g/dL (ref 30.0–36.0)
MCV: 85.3 fl (ref 78.0–100.0)
Monocytes Absolute: 0.6 10*3/uL (ref 0.1–1.0)
Neutro Abs: 5 10*3/uL (ref 1.4–7.7)
RBC: 5.01 Mil/uL (ref 3.87–5.11)
WBC: 7.2 10*3/uL (ref 4.5–10.5)

## 2011-12-29 LAB — HEPATIC FUNCTION PANEL
ALT: 41 U/L — ABNORMAL HIGH (ref 0–35)
Albumin: 4 g/dL (ref 3.5–5.2)
Alkaline Phosphatase: 102 U/L (ref 39–117)
Total Protein: 7.2 g/dL (ref 6.0–8.3)

## 2011-12-29 LAB — LIPID PANEL
Cholesterol: 156 mg/dL (ref 0–200)
LDL Cholesterol: 96 mg/dL (ref 0–99)
VLDL: 22.8 mg/dL (ref 0.0–40.0)

## 2011-12-29 LAB — VITAMIN B12: Vitamin B-12: 274 pg/mL (ref 211–911)

## 2011-12-29 NOTE — Progress Notes (Signed)
Subjective:    Patient ID: [name], female    DOB: Dec 27, 1933, 78 y.o.   MRN: [id number]  HPI Pt lives at [facility].  She returns for f/u of insulin-requiring DM (1997). no cbg record, but states cbg's are mostly in the high-100's.  She says it is highest in am (NPH was decreased at last ov, due to am-hypoglycemia).  She says cbg is highest at hs (high-100's).  Pt states 1 week of slight tingling of the left leg, and assoc pain.   Past Medical History  Diagnosis Date  . Encounter for long-term (current) use of other medications     Anitihyperlipidemic use, Long term  . DIABETES MELLITUS, TYPE I 05/30/2007  . HYPERCHOLESTEROLEMIA 10/23/2008  . HYPERTENSION 05/30/2007  . OSTEOARTHRITIS, LUMBAR SPINE 12/25/2007  . Depression 10/29/2011  . GERD (gastroesophageal reflux disease) 10/29/2011  . Diastolic CHF, chronic   . CAD (coronary artery disease)   . Anxiety problem   . Wedge compression fracture of T11-T12 vertebra   . Lumbar degenerative disc disease   . Anemia, unspecified   . Lumbar spinal stenosis   . Allergic rhinitis, cause unspecified 10/29/2011  . Osteoporosis 10/29/2011  . Failure to thrive 10/29/2011  . Osteoarthritis of right knee 10/29/2011  . History of colon polyps 11/01/2011  . Chronic pain 11/01/2011    Past Surgical History  Procedure Date  . Abdominal hysterectomy   . Cholecystectomy   . Appendectomy   . Back surgury     History   Social History  . Marital Status: Single    Spouse Name: N/A    Number of Children: N/A  . Years of Education: N/A   Occupational History  . Not on file.   Social History Main Topics  . Smoking status: Former Games developer  . Smokeless tobacco: Not on file  . Alcohol Use:   . Drug Use:   . Sexually Active:    Other Topics Concern  . Not on file   Social History Narrative   Separated 2010Lives alone    Current Outpatient Prescriptions on File Prior to Visit  Medication Sig Dispense Refill  . clonazePAM (KLONOPIN)  0.5 MG tablet Take 1 tablet (0.5 mg total) by mouth 2 (two) times daily as needed for anxiety.  60 tablet  2  . etodolac (LODINE) 500 MG tablet TAKE (1) TABLET TWICE A DAY AS NEEDED.  180 tablet  3  . fexofenadine (ALLEGRA) 180 MG tablet Take 1 tablet (180 mg total) by mouth daily.  90 tablet  3  . FLUoxetine (PROZAC) 20 MG capsule Take 1 capsule (20 mg total) by mouth daily.  90 capsule  3  . fluticasone (FLONASE) 50 MCG/ACT nasal spray Place 1 spray into the nose at bedtime. In each nostril  16 g  5  . gabapentin (NEURONTIN) 400 MG capsule Take 1 capsule (400 mg total) by mouth 3 (three) times daily.  270 capsule  1  . glucose blood (ACCU-CHEK AVIVA) test strip Use as instructed. Check blood sugar four times a day       . insulin lispro (HUMALOG) 100 UNIT/ML injection Inject into the skin 3 (three) times daily before meals. 45-40-65 units      . insulin NPH (HUMULIN N,NOVOLIN N) 100 UNIT/ML injection Inject 50 units subcutaneously every night       . Insulin Syringe-Needle U-100 (BD INSULIN SYRINGE ULTRAFINE) 31G X 5/16" 1 ML MISC Use as directed four times a day dx 250.01  300 each  2  . lisinopril (PRINIVIL,ZESTRIL) 20 MG tablet Take 1 tablet (20 mg total) by mouth every morning.  90 tablet  3  . metoCLOPramide (REGLAN) 10 MG tablet Take 1 tablet (10 mg total) by mouth daily. Take 1 by mouth once daily  90 tablet  3  . metoprolol succinate (TOPROL-XL) 25 MG 24 hr tablet Take 1 tablet (25 mg total) by mouth daily.  90 tablet  3  . omeprazole (PRILOSEC) 20 MG capsule Take 1 capsule (20 mg total) by mouth daily.  90 capsule  3  . oxyCODONE-acetaminophen (PERCOCET) 5-325 MG per tablet Take 1 tablet by mouth every 6 (six) hours as needed for pain. To fill Dec 31, 2011  120 tablet  0  . PAMELOR 25 MG capsule TAKE 1 CAPSULE AT BEDTIME.  90 each  3  . rosuvastatin (CRESTOR) 20 MG tablet Take 1 tablet (20 mg total) by mouth daily.  90 tablet  3    Allergies  Allergen Reactions  . Ace Inhibitors   .  Baclofen   . Cefaclor   . Chlorzoxazone   . Cloxacillin   . Codeine   . Diclofenac Sodium   . Erythromycin   . Meperidine Hcl   . Nsaids   . Promethazine   . Propoxyphene N-Acetaminophen   . Sulfonamide Derivatives     Family History  Problem Relation Age of Onset  . Diabetes Neg Hx     No DM in her immdidate family    BP 138/72  Pulse 97  Temp(Src) 98.1 F (36.7 C) (Oral)  Ht 5\' 3"  (1.6 m)  Wt 199 lb 9.6 oz (90.538 kg)  BMI 35.36 kg/m2  SpO2 98%    Review of Systems denies hypoglycemia and weight change    Objective:   Physical Exam VITAL SIGNS:  See vs page GENERAL: no distress Pulses: dorsalis pedis intact bilat.   Feet: no deformity.  no ulcer on the feet.  feet are of normal color and temp.  no edema.  There is bilateral onychomycosis. Skin on the feet is very dry Neuro: sensation is intact to touch on the legs and feet   Lab Results  Component Value Date   HGBA1C 7.7* 12/29/2011      Assessment & Plan:  Numbness, prob due to chronic radiculitis DM, needs increased rx.

## 2011-12-29 NOTE — Patient Instructions (Addendum)
blood tests are being requested for you today.  You will receive a letter with results. pending the test results, please continue the nph insulin at 50 units at bedtime.  continue humalog 3x a day, just before each meal) 45-40-60 units. check your blood sugar 2 times a day.  vary the time of day when you check, between before the 3 meals, and at bedtime.  also check if you have symptoms of your blood sugar being too high or too low.  please keep a record of the readings and bring it to your next appointment here.  please call us sooner if you are having low blood sugar episodes. Please make a follow-up appointment in 4 months.  (see letter)

## 2012-02-19 ENCOUNTER — Other Ambulatory Visit: Payer: Self-pay | Admitting: Endocrinology

## 2012-04-23 ENCOUNTER — Other Ambulatory Visit: Payer: Self-pay

## 2012-04-23 MED ORDER — OXYCODONE-ACETAMINOPHEN 5-325 MG PO TABS: 1.0000 | ORAL_TABLET | Freq: Four times a day (QID) | ORAL | Status: DC | PRN

## 2012-04-23 NOTE — Telephone Encounter (Signed)
Done hardcopy to robin  

## 2012-04-23 NOTE — Telephone Encounter (Signed)
Called the patient to inform rx ready for pickup, BUT no answer and vm has not been activated.

## 2012-04-24 NOTE — Telephone Encounter (Signed)
Called [name] pharmacy as they requested to mail rx and they would deliver to patient.  Informed  [facility] we could not mail prescriptions and that the hardcopy would need to be picked up at our office.  Toniann Fail at [facility] informed they would contact the patient and have a delivery person pickup prescription at the front desk for the patient and deliver medication to her home as she is unable to get out.

## 2012-05-01 ENCOUNTER — Ambulatory Visit (INDEPENDENT_AMBULATORY_CARE_PROVIDER_SITE_OTHER): Payer: Medicare Other | Admitting: Internal Medicine

## 2012-05-01 ENCOUNTER — Encounter: Payer: Self-pay | Admitting: Internal Medicine

## 2012-05-01 VITALS — BP 154/80 | HR 101 | Temp 97.8°F | Ht 63.0 in | Wt 192.0 lb

## 2012-05-01 DIAGNOSIS — E109 Type 1 diabetes mellitus without complications: Secondary | ICD-10-CM

## 2012-05-01 DIAGNOSIS — F329 Major depressive disorder, single episode, unspecified: Secondary | ICD-10-CM

## 2012-05-01 DIAGNOSIS — E7800 Pure hypercholesterolemia, unspecified: Secondary | ICD-10-CM

## 2012-05-01 DIAGNOSIS — I10 Essential (primary) hypertension: Secondary | ICD-10-CM

## 2012-05-01 MED ORDER — OLMESARTAN MEDOXOMIL-HCTZ 20-12.5 MG PO TABS: 1.0000 | ORAL_TABLET | Freq: Every day | ORAL | Status: DC

## 2012-05-01 NOTE — Patient Instructions (Addendum)
Take all new medications as prescribed - the benicar HCT at one per day Continue all other medications as before, except hold the Lisinopril 20 mg per day Please return in 3 months, or sooner if needed

## 2012-05-01 NOTE — Progress Notes (Signed)
Subjective:    Patient ID: [name], female    DOB: 02-Dec-1933, 78 y.o.   MRN: [id number]  HPI  Here to f/u; overall doing ok,  Pt denies chest pain, increased sob or doe, wheezing, orthopnea, PND, increased LE swelling, palpitations, dizziness or syncope.  Pt denies new neurological symptoms such as new headache, or facial or extremity weakness or numbness   Pt denies polydipsia, polyuria, or low sugar symptoms such as weakness or confusion improved with po intake.  Pt states overall good compliance with meds, trying to follow lower cholesterol, diabetic diet, wt overall stable but little exercise however. Has some daily irritation with waiting for others.  Lives at [employer].  Has severe financial constraints, not currently taking prozac, lisinopril, toprol xl, and crestor.  Taking only lodine, gabapentin, and perococet, and insulin.  Asks for samples today, takes her own meds.  Denies worsening depressive symptoms, suicidal ideation, or panic, though has ongoing anxiety.   Pt denies fever, wt loss, night sweats, loss of appetite, or other constitutional symptoms  Past Medical History  Diagnosis Date  . Encounter for long-term (current) use of other medications     Anitihyperlipidemic use, Long term  . DIABETES MELLITUS, TYPE I 05/30/2007  . HYPERCHOLESTEROLEMIA 10/23/2008  . HYPERTENSION 05/30/2007  . OSTEOARTHRITIS, LUMBAR SPINE 12/25/2007  . Depression 10/29/2011  . GERD (gastroesophageal reflux disease) 10/29/2011  . Diastolic CHF, chronic   . CAD (coronary artery disease)   . Anxiety problem   . Wedge compression fracture of T11-T12 vertebra   . Lumbar degenerative disc disease   . Anemia, unspecified   . Lumbar spinal stenosis   . Allergic rhinitis, cause unspecified 10/29/2011  . Osteoporosis 10/29/2011  . Failure to thrive in childhood 10/29/2011  . Osteoarthritis of right knee 10/29/2011  . History of colon polyps 11/01/2011  . Chronic pain 11/01/2011   Past Surgical  History  Procedure Date  . Abdominal hysterectomy   . Cholecystectomy   . Appendectomy   . Back surgury     reports that she has quit smoking. She does not have any smokeless tobacco history on file. Her alcohol and drug histories not on file. family history is negative for Diabetes. Allergies  Allergen Reactions  . Ace Inhibitors   . Baclofen   . Cefaclor   . Chlorzoxazone   . Cloxacillin   . Codeine   . Diclofenac Sodium   . Erythromycin   . Meperidine Hcl   . Nsaids   . Promethazine   . Propoxyphene-Acetaminophen   . Sulfonamide Derivatives    Current Outpatient Prescriptions on File Prior to Visit  Medication Sig Dispense Refill  . etodolac (LODINE) 500 MG tablet TAKE (1) TABLET TWICE A DAY AS NEEDED.  180 tablet  3  . fexofenadine (ALLEGRA) 180 MG tablet Take 1 tablet (180 mg total) by mouth daily.  90 tablet  3  . FLUoxetine (PROZAC) 20 MG capsule Take 1 capsule (20 mg total) by mouth daily.  90 capsule  3  . fluticasone (FLONASE) 50 MCG/ACT nasal spray Place 1 spray into the nose at bedtime. In each nostril  16 g  5  . gabapentin (NEURONTIN) 400 MG capsule Take 1 capsule (400 mg total) by mouth 3 (three) times daily.  270 capsule  1  . glucose blood (ACCU-CHEK AVIVA) test strip Use as instructed. Check blood sugar four times a day       . insulin lispro (HUMALOG) 100 UNIT/ML injection Inject into the  skin 3 (three) times daily before meals. 45-40-65 units      . insulin NPH (HUMULIN N) 100 UNIT/ML injection Inject 50 units subcutaneously every night  20 mL  4  . Insulin Syringe-Needle U-100 (BD INSULIN SYRINGE ULTRAFINE) 31G X 5/16" 1 ML MISC Use as directed four times a day dx 250.01  300 each  2  . lisinopril (PRINIVIL,ZESTRIL) 20 MG tablet Take 1 tablet (20 mg total) by mouth every morning.  90 tablet  3  . metoCLOPramide (REGLAN) 10 MG tablet Take 1 tablet (10 mg total) by mouth daily. Take 1 by mouth once daily  90 tablet  3  . metoprolol succinate (TOPROL-XL) 25 MG  24 hr tablet Take 1 tablet (25 mg total) by mouth daily.  90 tablet  3  . omeprazole (PRILOSEC) 20 MG capsule Take 1 capsule (20 mg total) by mouth daily.  90 capsule  3  . oxyCODONE-acetaminophen (PERCOCET) 5-325 MG per tablet Take 1 tablet by mouth every 6 (six) hours as needed for pain. To fill April 23, 2012  120 tablet  0  . PAMELOR 25 MG capsule TAKE 1 CAPSULE AT BEDTIME.  90 each  3  . rosuvastatin (CRESTOR) 20 MG tablet Take 1 tablet (20 mg total) by mouth daily.  90 tablet  3   Review of Systems Review of Systems  Constitutional: Negative for diaphoresis and unexpected weight change.  HENT: Negative for tinnitus.   Eyes: Negative for photophobia and visual disturbance.  Respiratory: Negative for cough.   Gastrointestinal: Negative for vomiting and blood in stool.  Genitourinary: Negative for hematuria and decreased urine volume.  Musculoskeletal: Negative for gait problem.  Skin: Negative for color change and wound.  Neurological: Negative for tremors and numbness.  Psychiatric/Behavioral: Negative for decreased concentration. The patient is not hyperactive.      Objective:   Physical Exam BP 154/80  Pulse 101  Temp 97.8 F (36.6 C) (Oral)  Ht 5\' 3"  (1.6 m)  Wt 192 lb (87.091 kg)  BMI 34.01 kg/m2  SpO2 95% Physical Exam  VS noted Constitutional: Pt appears well-developed and well-nourished.  HENT: Head: Normocephalic.  Right Ear: External ear normal.  Left Ear: External ear normal.  Eyes: Conjunctivae and EOM are normal. Pupils are equal, round, and reactive to light.  Neck: Normal range of motion. Neck supple.  Cardiovascular: Normal rate and regular rhythm.   Pulmonary/Chest: Effort normal and breath sounds normal.  Abd:  Soft, NT, non-distended, + BS Neurological: Pt is alert. Not confused Skin: Skin is warm. No erythema. No rash Psychiatric: Pt behavior is normal. Thought content normal. 1+nervous    Assessment & Plan:

## 2012-05-13 ENCOUNTER — Encounter: Payer: Self-pay | Admitting: Internal Medicine

## 2012-05-13 NOTE — Assessment & Plan Note (Signed)
stable overall by hx and exam, most recent data reviewed with pt, and pt to continue medical treatment as before Lab Results  Component Value Date   LDLCALC 96 12/29/2011

## 2012-05-13 NOTE — Assessment & Plan Note (Signed)
Sees Endo,  Lab Results  Component Value Date   HGBA1C 7.7* 12/29/2011  Continue all other medications as before

## 2012-05-13 NOTE — Assessment & Plan Note (Signed)
stable overall by hx and exam, most recent data reviewed with pt, and pt to continue medical treatment as before Lab Results  Component Value Date   WBC 7.2 12/29/2011   HGB 13.8 12/29/2011   HCT 42.7 12/29/2011   PLT 179.0 12/29/2011   GLUCOSE 208* 12/29/2011   CHOL 156 12/29/2011   TRIG 114.0 12/29/2011   HDL 37.00* 12/29/2011   LDLDIRECT 99.4 10/23/2008   LDLCALC 96 12/29/2011   ALT 41* 12/29/2011   AST 35 12/29/2011   NA 142 12/29/2011   K 4.2 12/29/2011   CL 108 12/29/2011   CREATININE 0.8 12/29/2011   BUN 12 12/29/2011   CO2 24 12/29/2011   TSH 0.57 12/29/2011   INR 1.0 04/28/2009   HGBA1C 7.7* 12/29/2011   MICROALBUR 1.6 12/29/2011

## 2012-05-13 NOTE — Assessment & Plan Note (Signed)
Uncontrolled, to change lisinopril to benciar HCT , samples given today,  to f/u any worsening symptoms or concerns BP Readings from Last 3 Encounters:  05/01/12 154/80  12/29/11 138/72  11/01/11 142/80

## 2012-05-17 ENCOUNTER — Telehealth: Payer: Self-pay

## 2012-05-17 MED ORDER — METOPROLOL SUCCINATE ER 25 MG PO TB24: 25.0000 mg | ORAL_TABLET | Freq: Two times a day (BID) | ORAL | Status: DC

## 2012-05-17 NOTE — Telephone Encounter (Signed)
/  Benicar hct unlikely cause of nosebleed but ok to stop same.   Recommend increasing toprol 25mg  from qd to bid and follow up with Dr Jonny Ruiz in next 2 weeks for recheck and review

## 2012-05-17 NOTE — Telephone Encounter (Signed)
Pt called stating that she was given samples of Benicar at last OV with Dr Jonny Ruiz but she believes it caused nose bleeds. Pt says that once she stopped taking it her nose bleeds stopped. Pt is requesting medication on BP management.

## 2012-05-18 ENCOUNTER — Other Ambulatory Visit: Payer: Self-pay | Admitting: *Deleted

## 2012-05-18 MED ORDER — INSULIN LISPRO 100 UNIT/ML ~~LOC~~ SOLN
SUBCUTANEOUS | Status: DC
Start: 2012-05-18 — End: 2012-06-07

## 2012-05-18 NOTE — Telephone Encounter (Signed)
Left message on machine for pt to return my call  

## 2012-05-18 NOTE — Telephone Encounter (Signed)
R'cd fax from [facility] for refill of Humalog.

## 2012-05-21 ENCOUNTER — Telehealth: Payer: Self-pay

## 2012-05-21 MED ORDER — NORTRIPTYLINE HCL 25 MG PO CAPS: 25.0000 mg | ORAL_CAPSULE | Freq: Every evening | ORAL | Status: DC | PRN

## 2012-05-21 NOTE — Telephone Encounter (Signed)
Called left message to call back 

## 2012-05-21 NOTE — Telephone Encounter (Signed)
Same thing as pamelor   rx done erx

## 2012-05-21 NOTE — Telephone Encounter (Signed)
Pharmacy requesting refill on Nortriptyline 25 mg qhs please advise currently not on med. List.

## 2012-05-22 NOTE — Telephone Encounter (Signed)
Called the patient left message to call back 

## 2012-05-22 NOTE — Telephone Encounter (Signed)
Called the patient informed of MD's instructions.  She agreed to all and did schedule followup appt. With Dr. Jonny Ruiz.

## 2012-05-30 ENCOUNTER — Telehealth: Payer: Self-pay | Admitting: Internal Medicine

## 2012-05-30 ENCOUNTER — Ambulatory Visit (INDEPENDENT_AMBULATORY_CARE_PROVIDER_SITE_OTHER): Payer: Medicare Other | Admitting: Internal Medicine

## 2012-05-30 VITALS — BP 160/88 | HR 80 | Temp 98.7°F | Ht 63.0 in | Wt 191.4 lb

## 2012-05-30 DIAGNOSIS — E7800 Pure hypercholesterolemia, unspecified: Secondary | ICD-10-CM

## 2012-05-30 DIAGNOSIS — I2510 Atherosclerotic heart disease of native coronary artery without angina pectoris: Secondary | ICD-10-CM

## 2012-05-30 DIAGNOSIS — F419 Anxiety disorder, unspecified: Secondary | ICD-10-CM

## 2012-05-30 DIAGNOSIS — F99 Mental disorder, not otherwise specified: Secondary | ICD-10-CM

## 2012-05-30 DIAGNOSIS — I10 Essential (primary) hypertension: Secondary | ICD-10-CM

## 2012-05-30 MED ORDER — METOPROLOL SUCCINATE ER 50 MG PO TB24: 50.0000 mg | ORAL_TABLET | Freq: Every day | ORAL | Status: DC

## 2012-05-30 MED ORDER — ROSUVASTATIN CALCIUM 20 MG PO TABS: 20.0000 mg | ORAL_TABLET | Freq: Every day | ORAL | Status: DC

## 2012-05-30 MED ORDER — METOPROLOL SUCCINATE ER 25 MG PO TB24: 50.0000 mg | ORAL_TABLET | Freq: Two times a day (BID) | ORAL | Status: DC

## 2012-05-30 MED ORDER — OLMESARTAN MEDOXOMIL-HCTZ 20-12.5 MG PO TABS: 1.0000 | ORAL_TABLET | Freq: Every day | ORAL | Status: DC

## 2012-05-30 NOTE — Assessment & Plan Note (Addendum)
To plesae re-start the benicar HCT, Continue all other medications as before, if has further nosebleeds should see ENT (gave 2 mo samples today)

## 2012-05-30 NOTE — Patient Instructions (Addendum)
Please take the benicar HCT at 20/12.5 mg per day Please call if you have further nosebleeds, as you would most likely need to see ENT Continue all other medications as before, including the metoprolol at 50 mg per day, and the crestor Continue all other medications as before Please keep your appointments with your specialists as you have planned - Dr Everardo All for the Diabetes

## 2012-05-30 NOTE — Telephone Encounter (Signed)
rx corrected 

## 2012-05-30 NOTE — Telephone Encounter (Signed)
Message copied by Corwin Levins on Wed May 30, 2012 12:35 PM ------      Message from: Scharlene Gloss B      Created: Wed May 30, 2012 11:13 AM       The pharmacy needs clarification on metoprolol rx sent in today.  Instructions state to take 2 BID and sent in # 90.

## 2012-05-30 NOTE — Assessment & Plan Note (Addendum)
To re-start the crestor -sent to pharmacy, o/w stable overall by hx and exam, most recent data reviewed with pt, and pt to continue medical treatment as before Lab Results  Component Value Date   LDLCALC 96 12/29/2011

## 2012-06-03 ENCOUNTER — Encounter: Payer: Self-pay | Admitting: Internal Medicine

## 2012-06-03 NOTE — Progress Notes (Signed)
Subjective:    Patient ID: [name], female    DOB: [birth year], 78 y.o.   MRN: [id number]  HPI  Here to f/u;  Unfortunately experienced a recent self limited nosebleed, became concerned it was a med sideeffect, so stopped her benicar HCT and crestor;  BP has been high that time;  Pt denies chest pain, increased sob or doe, wheezing, orthopnea, PND, increased LE swelling, palpitations, dizziness or syncope.   Pt denies polydipsia, polyuria, sees Dr Everardo All for DM.  Trying to follow DM diet, and lower cholesterol.  Pt denies new neurological symptoms such as new headache, or facial or extremity weakness or numbness  Denies worsening depressive symptoms, suicidal ideation, or panic, though has ongoing anxiety.  No other nosebleed, overt bleeding or bruising, or other sinus symptoms such as pain, congestion. Past Medical History  Diagnosis Date  . Encounter for long-term (current) use of other medications     Anitihyperlipidemic use, Long term  . DIABETES MELLITUS, TYPE I 05/30/2007  . HYPERCHOLESTEROLEMIA 10/23/2008  . HYPERTENSION 05/30/2007  . OSTEOARTHRITIS, LUMBAR SPINE 12/25/2007  . Depression 10/29/2011  . GERD (gastroesophageal reflux disease) 10/29/2011  . Diastolic CHF, chronic   . CAD (coronary artery disease)   . Anxiety problem   . Wedge compression fracture of T11-T12 vertebra   . Lumbar degenerative disc disease   . Anemia, unspecified   . Lumbar spinal stenosis   . Allergic rhinitis, cause unspecified 10/29/2011  . Osteoporosis 10/29/2011  . Failure to thrive in childhood 10/29/2011  . Osteoarthritis of right knee 10/29/2011  . History of colon polyps 11/01/2011  . Chronic pain 11/01/2011   Past Surgical History  Procedure Date  . Abdominal hysterectomy   . Cholecystectomy   . Appendectomy   . Back surgury     reports that she has quit smoking. She does not have any smokeless tobacco history on file. Her alcohol and drug histories not on file. family history is negative  for Diabetes. Allergies  Allergen Reactions  . Ace Inhibitors   . Baclofen   . Cefaclor   . Chlorzoxazone   . Cloxacillin   . Codeine   . Diclofenac Sodium   . Erythromycin   . Meperidine Hcl   . Nsaids   . Promethazine   . Propoxyphene-Acetaminophen   . Sulfonamide Derivatives    Current Outpatient Prescriptions on File Prior to Visit  Medication Sig Dispense Refill  . etodolac (LODINE) 500 MG tablet TAKE (1) TABLET TWICE A DAY AS NEEDED.  180 tablet  3  . fexofenadine (ALLEGRA) 180 MG tablet Take 1 tablet (180 mg total) by mouth daily.  90 tablet  3  . FLUoxetine (PROZAC) 20 MG capsule Take 1 capsule (20 mg total) by mouth daily.  90 capsule  3  . fluticasone (FLONASE) 50 MCG/ACT nasal spray Place 1 spray into the nose at bedtime. In each nostril  16 g  5  . gabapentin (NEURONTIN) 400 MG capsule Take 1 capsule (400 mg total) by mouth 3 (three) times daily.  270 capsule  1  . glucose blood (ACCU-CHEK AVIVA) test strip Use as instructed. Check blood sugar four times a day       . insulin lispro (HUMALOG) 100 UNIT/ML injection Inject into the skin 3 (three) times daily before meals. 45-40-60 units  50 mL  4  . insulin NPH (HUMULIN N) 100 UNIT/ML injection Inject 50 units subcutaneously every night  20 mL  4  . Insulin Syringe-Needle U-100 (BD INSULIN  SYRINGE ULTRAFINE) 31G X 5/16" 1 ML MISC Use as directed four times a day dx 250.01  300 each  2  . metoCLOPramide (REGLAN) 10 MG tablet Take 1 tablet (10 mg total) by mouth daily. Take 1 by mouth once daily  90 tablet  3  . nortriptyline (PAMELOR) 25 MG capsule Take 1 capsule (25 mg total) by mouth at bedtime as needed.  90 capsule  1  . omeprazole (PRILOSEC) 20 MG capsule Take 1 capsule (20 mg total) by mouth daily.  90 capsule  3  . oxyCODONE-acetaminophen (PERCOCET) 5-325 MG per tablet Take 1 tablet by mouth every 6 (six) hours as needed for pain. To fill April 23, 2012  120 tablet  0  . rosuvastatin (CRESTOR) 20 MG tablet Take 1 tablet  (20 mg total) by mouth daily.  90 tablet  3   Review of Systems Review of Systems  Constitutional: Negative for diaphoresis and unexpected weight change.  HENT: Negative for drooling and tinnitus.   Eyes: Negative for photophobia and visual disturbance.  Respiratory: Negative for choking and stridor.   Gastrointestinal: Negative for vomiting and blood in stool.  Genitourinary: Negative for hematuria and decreased urine volume.  Musculoskeletal: Negative for gait problem.  Skin: Negative for color change and wound.  Neurological: Negative for tremors and numbness.  Psychiatric/Behavioral: Negative for decreased concentration. The patient is not hyperactive.      Objective:   Physical Exam BP 160/88  Pulse 80  Temp 98.7 F (37.1 C) (Oral)  Ht 5\' 3"  (1.6 m)  Wt 191 lb 6 oz (86.807 kg)  BMI 33.90 kg/m2  SpO2 96% Physical Exam  VS noted, not ill appearing Constitutional: Pt appears well-developed and well-nourished.  HENT: Head: Normocephalic.  Right Ear: External ear normal.  Left Ear: External ear normal.  Sinus nontender Eyes: Conjunctivae and EOM are normal. Pupils are equal, round, and reactive to light.  Neck: Normal range of motion. Neck supple.  Cardiovascular: Normal rate and regular rhythm.   Pulmonary/Chest: Effort normal and breath sounds normal.  Neurological: Pt is alert. Not confused Skin: Skin is warm. No erythema.  Psychiatric: Pt behavior is normal. Thought content normal.     Assessment & Plan:

## 2012-06-03 NOTE — Assessment & Plan Note (Signed)
Declines any change in tx at this time or counseling, tried to reassure regarding medication and side effect

## 2012-06-03 NOTE — Assessment & Plan Note (Signed)
stable overall by hx and exam, most recent data reviewed with pt, and pt to continue medical treatment as before Lab Results  Component Value Date   WBC 7.2 12/29/2011   HGB 13.8 12/29/2011   HCT 42.7 12/29/2011   PLT 179.0 12/29/2011   GLUCOSE 208* 12/29/2011   CHOL 156 12/29/2011   TRIG 114.0 12/29/2011   HDL 37.00* 12/29/2011   LDLDIRECT 99.4 10/23/2008   LDLCALC 96 12/29/2011   ALT 41* 12/29/2011   AST 35 12/29/2011   NA 142 12/29/2011   K 4.2 12/29/2011   CL 108 12/29/2011   CREATININE 0.8 12/29/2011   BUN 12 12/29/2011   CO2 24 12/29/2011   TSH 0.57 12/29/2011   INR 1.0 04/28/2009   HGBA1C 7.7* 12/29/2011   MICROALBUR 1.6 12/29/2011    

## 2012-06-07 ENCOUNTER — Encounter: Payer: Self-pay | Admitting: Endocrinology

## 2012-06-07 ENCOUNTER — Ambulatory Visit (INDEPENDENT_AMBULATORY_CARE_PROVIDER_SITE_OTHER): Payer: Medicare Other | Admitting: Endocrinology

## 2012-06-07 ENCOUNTER — Other Ambulatory Visit (INDEPENDENT_AMBULATORY_CARE_PROVIDER_SITE_OTHER): Payer: Medicare Other

## 2012-06-07 ENCOUNTER — Other Ambulatory Visit: Payer: Self-pay | Admitting: *Deleted

## 2012-06-07 VITALS — BP 112/74 | HR 126 | Temp 97.4°F | Wt 191.0 lb

## 2012-06-07 DIAGNOSIS — E109 Type 1 diabetes mellitus without complications: Secondary | ICD-10-CM

## 2012-06-07 LAB — HEMOGLOBIN A1C: Hgb A1c MFr Bld: 7.3 % — ABNORMAL HIGH (ref 4.6–6.5)

## 2012-06-07 LAB — GLUCOSE, POCT (MANUAL RESULT ENTRY)

## 2012-06-07 MED ORDER — GABAPENTIN 400 MG PO CAPS: 400.0000 mg | ORAL_CAPSULE | Freq: Three times a day (TID) | ORAL | Status: DC

## 2012-06-07 MED ORDER — OXYCODONE-ACETAMINOPHEN 5-325 MG PO TABS: 1.0000 | ORAL_TABLET | Freq: Four times a day (QID) | ORAL | Status: DC | PRN

## 2012-06-07 NOTE — Telephone Encounter (Signed)
Tried calling pt several times. No answer not able to leave vm because vm not set-up. Putting rx in cabinet for pick-up... 06/07/12@2 :30pm/LMB

## 2012-06-07 NOTE — Telephone Encounter (Signed)
Tried calling pt no answer, and not able to leave msg due to vm not being set-up... 06/07/12@11 :34am/LMB

## 2012-06-07 NOTE — Telephone Encounter (Signed)
Pt was here for OV with SAE today and  needs refill of Gabapentin and Percocet.

## 2012-06-07 NOTE — Telephone Encounter (Signed)
Done hardcopy to robin  

## 2012-06-07 NOTE — Patient Instructions (Addendum)
blood tests are being requested for you today.  You will receive a letter with results. Please continue the reduced amount of insulin, as listed below. check your blood sugar twice a day.  vary the time of day when you check, between before the 3 meals, and at bedtime.  also check if you have symptoms of your blood sugar being too high or too low.  please keep a record of the readings and bring it to your next appointment here.  please call us sooner if your blood sugar goes below 70, or if you have a lot of readings over 200. Please come back for a follow-up appointment in 3 months.

## 2012-06-07 NOTE — Progress Notes (Signed)
Subjective:    Patient ID: Scot Jun, female    DOB: November 20, 1933, 78 y.o.   MRN: [id number]  HPI Pt lives at [facility].  She returns for f/u of insulin-requiring DM (dx'ed 1997; complicated by CAD). no cbg record, but states she had hypoglycemia twice--once before lunch, and once at hs.  Pt says the lunch hypoglycemia was due to the fact that there was a delay between her lunch humalog dose and eating the meal, and cbg's are mostly in the 100's.  She says cbg is highest at lunch (high-100's).  She reduced the supper humalog to 40 units. Past Medical History  Diagnosis Date  . Encounter for long-term (current) use of other medications     Anitihyperlipidemic use, Long term  . DIABETES MELLITUS, TYPE I 05/30/2007  . HYPERCHOLESTEROLEMIA 10/23/2008  . HYPERTENSION 05/30/2007  . OSTEOARTHRITIS, LUMBAR SPINE 12/25/2007  . Depression 10/29/2011  . GERD (gastroesophageal reflux disease) 10/29/2011  . Diastolic CHF, chronic   . CAD (coronary artery disease)   . Anxiety problem   . Wedge compression fracture of T11-T12 vertebra   . Lumbar degenerative disc disease   . Anemia, unspecified   . Lumbar spinal stenosis   . Allergic rhinitis, cause unspecified 10/29/2011  . Osteoporosis 10/29/2011  . Failure to thrive in childhood 10/29/2011  . Osteoarthritis of right knee 10/29/2011  . History of colon polyps 11/01/2011  . Chronic pain 11/01/2011    Past Surgical History  Procedure Date  . Abdominal hysterectomy   . Cholecystectomy   . Appendectomy   . Back surgury     History   Social History  . Marital Status: Single    Spouse Name: N/A    Number of Children: N/A  . Years of Education: N/A   Occupational History  . Not on file.   Social History Main Topics  . Smoking status: Former Games developer  . Smokeless tobacco: Not on file  . Alcohol Use:   . Drug Use:   . Sexually Active:    Other Topics Concern  . Not on file   Social History Narrative   Separated 2010Lives alone     Current Outpatient Prescriptions on File Prior to Visit  Medication Sig Dispense Refill  . etodolac (LODINE) 500 MG tablet TAKE (1) TABLET TWICE A DAY AS NEEDED.  180 tablet  3  . fexofenadine (ALLEGRA) 180 MG tablet Take 1 tablet (180 mg total) by mouth daily.  90 tablet  3  . fluticasone (FLONASE) 50 MCG/ACT nasal spray Place 1 spray into the nose at bedtime. In each nostril  16 g  5  . glucose blood (ACCU-CHEK AVIVA) test strip Use as instructed. Check blood sugar four times a day       . insulin lispro (HUMALOG) 100 UNIT/ML injection Inject into the skin 3 (three) times daily before meals. 45-40-40 units      . insulin NPH (HUMULIN N) 100 UNIT/ML injection Inject 50 units subcutaneously every night  20 mL  4  . Insulin Syringe-Needle U-100 (BD INSULIN SYRINGE ULTRAFINE) 31G X 5/16" 1 ML MISC Use as directed four times a day dx 250.01  300 each  2  . metoCLOPramide (REGLAN) 10 MG tablet Take 1 tablet (10 mg total) by mouth daily. Take 1 by mouth once daily  90 tablet  3  . metoprolol succinate (TOPROL-XL) 50 MG 24 hr tablet Take 1 tablet (50 mg total) by mouth daily. Take with or immediately following a meal.  90  tablet  3  . nortriptyline (PAMELOR) 25 MG capsule Take 1 capsule (25 mg total) by mouth at bedtime as needed.  90 capsule  1  . olmesartan-hydrochlorothiazide (BENICAR HCT) 20-12.5 MG per tablet Take 1 tablet by mouth daily.  90 tablet  3  . omeprazole (PRILOSEC) 20 MG capsule Take 1 capsule (20 mg total) by mouth daily.  90 capsule  3  . rosuvastatin (CRESTOR) 20 MG tablet Take 1 tablet (20 mg total) by mouth daily.  90 tablet  3  . gabapentin (NEURONTIN) 400 MG capsule Take 1 capsule (400 mg total) by mouth 3 (three) times daily.  270 capsule  1    Allergies  Allergen Reactions  . Ace Inhibitors   . Baclofen   . Cefaclor   . Chlorzoxazone   . Cloxacillin   . Codeine   . Diclofenac Sodium   . Erythromycin   . Meperidine Hcl   . Nsaids   . Promethazine   .  Propoxyphene-Acetaminophen   . Sulfonamide Derivatives     Family History  Problem Relation Age of Onset  . Diabetes Neg Hx     No DM in her immdidate family    BP 112/74  Pulse 126  Temp 97.4 F (36.3 C) (Oral)  Wt 191 lb (86.637 kg)  SpO2 97%  Review of Systems Denies LOC    Objective:   Physical Exam VITAL SIGNS:  See vs page GENERAL: no distress Pulses: dorsalis pedis intact bilat.   Feet: no deformity.  no ulcer on the feet.  feet are of normal color and temp.  no edema.  There is bilateral onychomycosis, and dry skin on the feet. Neuro: sensation is intact to touch on the feet.     Lab Results  Component Value Date   HGBA1C 7.3* 06/07/2012      Assessment & Plan:  DM: Based on the pattern of her cbg's, she needs some adjustment in her therapy

## 2012-06-08 ENCOUNTER — Telehealth: Payer: Self-pay | Admitting: *Deleted

## 2012-06-08 NOTE — Telephone Encounter (Signed)
Called pt to inform of lab results, pt informed of lab results (letter also mailed to pt). 

## 2012-06-26 ENCOUNTER — Other Ambulatory Visit: Payer: Self-pay | Admitting: Internal Medicine

## 2012-06-27 NOTE — Telephone Encounter (Signed)
Done hardcopy to robin  

## 2012-06-27 NOTE — Telephone Encounter (Signed)
Faxed hardcopy to pharmacy. 

## 2012-07-26 ENCOUNTER — Encounter: Payer: Self-pay | Admitting: Internal Medicine

## 2012-07-31 ENCOUNTER — Encounter: Payer: Self-pay | Admitting: Internal Medicine

## 2012-07-31 ENCOUNTER — Ambulatory Visit (INDEPENDENT_AMBULATORY_CARE_PROVIDER_SITE_OTHER): Payer: Medicare Other | Admitting: Internal Medicine

## 2012-07-31 VITALS — BP 140/90 | HR 104 | Temp 97.7°F | Ht 63.0 in | Wt 186.0 lb

## 2012-07-31 DIAGNOSIS — E7800 Pure hypercholesterolemia, unspecified: Secondary | ICD-10-CM

## 2012-07-31 DIAGNOSIS — R259 Unspecified abnormal involuntary movements: Secondary | ICD-10-CM

## 2012-07-31 DIAGNOSIS — E109 Type 1 diabetes mellitus without complications: Secondary | ICD-10-CM

## 2012-07-31 DIAGNOSIS — F329 Major depressive disorder, single episode, unspecified: Secondary | ICD-10-CM

## 2012-07-31 DIAGNOSIS — Z23 Encounter for immunization: Secondary | ICD-10-CM

## 2012-07-31 DIAGNOSIS — R251 Tremor, unspecified: Secondary | ICD-10-CM | POA: Insufficient documentation

## 2012-07-31 DIAGNOSIS — I10 Essential (primary) hypertension: Secondary | ICD-10-CM

## 2012-07-31 MED ORDER — OXYCODONE-ACETAMINOPHEN 5-325 MG PO TABS: 1.0000 | ORAL_TABLET | Freq: Four times a day (QID) | ORAL | Status: DC | PRN

## 2012-07-31 MED ORDER — NORTRIPTYLINE HCL 25 MG PO CAPS: 25.0000 mg | ORAL_CAPSULE | Freq: Every evening | ORAL | Status: DC | PRN

## 2012-07-31 MED ORDER — ETODOLAC 500 MG PO TABS: 500.0000 mg | ORAL_TABLET | Freq: Every day | ORAL | Status: DC | PRN

## 2012-07-31 NOTE — Assessment & Plan Note (Signed)
stable overall by hx and exam, most recent data reviewed with pt, and pt to continue medical treatment as before - sees endo on regular basis

## 2012-07-31 NOTE — Assessment & Plan Note (Signed)
stable overall by hx and exam, most recent data reviewed with pt, and pt to continue medical treatment as before, verified nonsuicidal

## 2012-07-31 NOTE — Assessment & Plan Note (Signed)
stable overall by hx and exam, most recent data reviewed with pt, and pt to continue medical treatment as before BP Readings from Last 3 Encounters:  07/31/12 140/90  06/07/12 112/74  05/30/12 160/88

## 2012-07-31 NOTE — Patient Instructions (Addendum)
You had the flu shot today Continue all other medications as before No further blood work needed today Your medication was refilled today as requested Please keep your appointments with your specialists as you have planned - Dr Everardo All, as you do Please return in 6 months, or sooner if needed Please remember to sign up for My Chart at your earliest convenience, as this will be important to you in the future with finding out test results.

## 2012-07-31 NOTE — Assessment & Plan Note (Signed)
stable overall by hx and exam, most recent data reviewed with pt, and pt to continue medical treatment as before Lab Results  Component Value Date   LDLCALC 96 12/29/2011    

## 2012-07-31 NOTE — Progress Notes (Signed)
Subjective:    Patient ID: [name], female    DOB: 1933/12/01, 78 y.o.   MRN: [id number]  HPI  Here to f/u; overall doing ok,  Pt denies chest pain, increased sob or doe, wheezing, orthopnea, PND, increased LE swelling, palpitations, dizziness or syncope.  Pt denies new neurological symptoms such as new headache, or facial or extremity weakness or numbness   Pt denies polydipsia, polyuria, or low sugar symptoms such as weakness or confusion improved with po intake.  Pt states overall good compliance with meds, trying to follow lower cholesterol, diabetic diet, wt overall stable but little exercise however.  Does have ongoing tremor, and shuffling type gait today - I think maybe c/w parkinsons?   Pt denies fever, wt loss, night sweats, loss of appetite, or other constitutional symptoms  Denies worsening depressive symptoms, suicidal ideation, or panic Past Medical History  Diagnosis Date  . Encounter for long-term (current) use of other medications     Anitihyperlipidemic use, Long term  . DIABETES MELLITUS, TYPE I 05/30/2007  . HYPERCHOLESTEROLEMIA 10/23/2008  . HYPERTENSION 05/30/2007  . OSTEOARTHRITIS, LUMBAR SPINE 12/25/2007  . Depression 10/29/2011  . GERD (gastroesophageal reflux disease) 10/29/2011  . Diastolic CHF, chronic   . CAD (coronary artery disease)   . Anxiety problem   . Wedge compression fracture of T11-T12 vertebra   . Lumbar degenerative disc disease   . Anemia, unspecified   . Lumbar spinal stenosis   . Allergic rhinitis, cause unspecified 10/29/2011  . Osteoporosis 10/29/2011  . Failure to thrive in childhood 10/29/2011  . Osteoarthritis of right knee 10/29/2011  . History of colon polyps 11/01/2011  . Chronic pain 11/01/2011   Past Surgical History  Procedure Date  . Abdominal hysterectomy   . Cholecystectomy   . Appendectomy   . Back surgury     reports that she has quit smoking. She does not have any smokeless tobacco history on file. Her alcohol and drug  histories not on file. family history is negative for Diabetes. Allergies  Allergen Reactions  . Ace Inhibitors   . Baclofen   . Cefaclor   . Chlorzoxazone   . Cloxacillin   . Codeine   . Diclofenac Sodium   . Erythromycin   . Meperidine Hcl   . Nsaids   . Promethazine   . Propoxyphene-Acetaminophen   . Sulfonamide Derivatives    Current Outpatient Prescriptions on File Prior to Visit  Medication Sig Dispense Refill  . fexofenadine (ALLEGRA) 180 MG tablet Take 1 tablet (180 mg total) by mouth daily.  90 tablet  3  . fluticasone (FLONASE) 50 MCG/ACT nasal spray Place 1 spray into the nose at bedtime. In each nostril  16 g  5  . gabapentin (NEURONTIN) 400 MG capsule Take 1 capsule (400 mg total) by mouth 3 (three) times daily.  270 capsule  1  . glucose blood (ACCU-CHEK AVIVA) test strip Use as instructed. Check blood sugar four times a day       . insulin lispro (HUMALOG) 100 UNIT/ML injection Inject into the skin 3 (three) times daily before meals. 45-40-40 units      . insulin NPH (HUMULIN N) 100 UNIT/ML injection Inject 50 units subcutaneously every night  20 mL  4  . Insulin Syringe-Needle U-100 (BD INSULIN SYRINGE ULTRAFINE) 31G X 5/16" 1 ML MISC Use as directed four times a day dx 250.01  300 each  2  . KLONOPIN 0.5 MG tablet TAKE (1) TABLET TWICE DAILY AS NEEDED  FOR ANXIETY.  60 each  2  . metoCLOPramide (REGLAN) 10 MG tablet Take 1 tablet (10 mg total) by mouth daily. Take 1 by mouth once daily  90 tablet  3  . metoprolol succinate (TOPROL-XL) 50 MG 24 hr tablet Take 1 tablet (50 mg total) by mouth daily. Take with or immediately following a meal.  90 tablet  3  . olmesartan-hydrochlorothiazide (BENICAR HCT) 20-12.5 MG per tablet Take 1 tablet by mouth daily.  90 tablet  3  . omeprazole (PRILOSEC) 20 MG capsule Take 1 capsule (20 mg total) by mouth daily.  90 capsule  3  . rosuvastatin (CRESTOR) 20 MG tablet Take 1 tablet (20 mg total) by mouth daily.  90 tablet  3  .  DISCONTD: nortriptyline (PAMELOR) 25 MG capsule Take 1 capsule (25 mg total) by mouth at bedtime as needed.  90 capsule  1  . clonazePAM (KLONOPIN) 0.5 MG tablet Take 1 tablet (0.5 mg total) by mouth 2 (two) times daily as needed for anxiety.  60 tablet  2  ' Review of Systems  Constitutional: Negative for diaphoresis and unexpected weight change.  HENT: Negative for tinnitus.   Eyes: Negative for photophobia and visual disturbance.  Respiratory: Negative for choking and stridor.   Gastrointestinal: Negative for vomiting and blood in stool.  Genitourinary: Negative for hematuria and decreased urine volume.  Musculoskeletal: Negative for gait problem.  Skin: Negative for color change and wound.  Neurological: Negative for tremors and numbness.  Psychiatric/Behavioral: Negative for decreased concentration. The patient is not hyperactive.       Objective:   Physical Exam BP 140/90  Pulse 104  Temp 97.7 F (36.5 C) (Oral)  Ht 5\' 3"  (1.6 m)  Wt 186 lb (84.369 kg)  BMI 32.95 kg/m2  SpO2 95% Physical Exam  VS noted Constitutional: Pt appears well-developed and well-nourished.  HENT: Head: Normocephalic.  Right Ear: External ear normal.  Left Ear: External ear normal.  Eyes: Conjunctivae and EOM are normal. Pupils are equal, round, and reactive to light.  Neck: Normal range of motion. Neck supple.  Cardiovascular: Normal rate and regular rhythm.   Pulmonary/Chest: Effort normal and breath sounds normal.  Neurological: Pt is alert. Not confused , leg tremor noted, has reduced arm swing, ? Shuffling gait, leans forward on ambulation Skin: Skin is warm. No erythema.  Psychiatric: Pt behavior is normal. Thought content normal. 1+ nervous    Assessment & Plan:

## 2012-07-31 NOTE — Assessment & Plan Note (Signed)
With gait and tremor I think suggestive of parkinsonmism, pt declines further eval such as [facility]

## 2012-08-13 ENCOUNTER — Telehealth: Payer: Self-pay | Admitting: Endocrinology

## 2012-08-13 NOTE — Telephone Encounter (Signed)
Sugar has dropped twice. She would like to speak to Dr. Everardo All or his nurse.

## 2012-08-14 NOTE — Telephone Encounter (Signed)
Pt states that her bllod sugar dropped to 33 one time last month, she called EMS, just wanted to make you aware, she has not had any problems since

## 2012-08-14 NOTE — Telephone Encounter (Signed)
i need to know the time of day

## 2012-08-14 NOTE — Telephone Encounter (Signed)
Pt called again regarding her blood sugar dropping. She wants to know if she needs to adjust her meds. She states her sugar has dropped twice in 2 months.

## 2012-08-14 NOTE — Telephone Encounter (Signed)
What time of day is it going low?

## 2012-08-15 NOTE — Telephone Encounter (Signed)
Pt notified to decrease NPH to 40 units at bedtime.

## 2012-08-15 NOTE — Telephone Encounter (Signed)
Decrease nph to 40 units qhs

## 2012-08-15 NOTE — Telephone Encounter (Signed)
Pt did not know the time of day, she stated it only happened one time last month

## 2012-08-20 ENCOUNTER — Other Ambulatory Visit: Payer: Self-pay

## 2012-08-20 ENCOUNTER — Other Ambulatory Visit: Payer: Self-pay | Admitting: *Deleted

## 2012-08-20 MED ORDER — INSULIN NPH (HUMAN) (ISOPHANE) 100 UNIT/ML ~~LOC~~ SUSP: 60.0000 [IU] | Freq: Every day | SUBCUTANEOUS | Status: DC

## 2012-08-20 MED ORDER — OXYCODONE-ACETAMINOPHEN 5-325 MG PO TABS: 1.0000 | ORAL_TABLET | Freq: Four times a day (QID) | ORAL | Status: DC | PRN

## 2012-08-20 NOTE — Telephone Encounter (Signed)
Done hardcopy to robin  

## 2012-08-20 NOTE — Telephone Encounter (Signed)
rx sent for Humilin N 100 units/mL vial, 30 mL, inject 60 units subq every night.

## 2012-08-20 NOTE — Telephone Encounter (Signed)
Called [facility] per refill request to pickup hardcopy at the front desk.

## 2012-09-05 ENCOUNTER — Encounter: Payer: Self-pay | Admitting: Endocrinology

## 2012-09-05 ENCOUNTER — Ambulatory Visit (INDEPENDENT_AMBULATORY_CARE_PROVIDER_SITE_OTHER): Payer: Medicare Other | Admitting: Endocrinology

## 2012-09-05 VITALS — BP 128/82 | HR 94 | Temp 97.9°F | Wt 185.0 lb

## 2012-09-05 DIAGNOSIS — E109 Type 1 diabetes mellitus without complications: Secondary | ICD-10-CM

## 2012-09-05 NOTE — Progress Notes (Signed)
Subjective:    Patient ID: [name], female    DOB: Dec 21, 1933, 78 y.o.   MRN: [id number]  HPI Pt now lives at her own apartment.  She returns for f/u of insulin-requiring DM (dx'ed 1997; complicated by CAD). no cbg record, but states she had hypoglycemia twice--both were at hs.  no cbg record, but states cbg's are otherwise well-controlled.  She says she takes humalog 40 units qac and qhs (in addition to the nph qhs). Past Medical History  Diagnosis Date  . Encounter for long-term (current) use of other medications     Anitihyperlipidemic use, Long term  . DIABETES MELLITUS, TYPE I 05/30/2007  . HYPERCHOLESTEROLEMIA 10/23/2008  . HYPERTENSION 05/30/2007  . OSTEOARTHRITIS, LUMBAR SPINE 12/25/2007  . Depression 10/29/2011  . GERD (gastroesophageal reflux disease) 10/29/2011  . Diastolic CHF, chronic   . CAD (coronary artery disease)   . Anxiety problem   . Wedge compression fracture of T11-T12 vertebra   . Lumbar degenerative disc disease   . Anemia, unspecified   . Lumbar spinal stenosis   . Allergic rhinitis, cause unspecified 10/29/2011  . Osteoporosis 10/29/2011  . Failure to thrive in childhood 10/29/2011  . Osteoarthritis of right knee 10/29/2011  . History of colon polyps 11/01/2011  . Chronic pain 11/01/2011    Past Surgical History  Procedure Date  . Abdominal hysterectomy   . Cholecystectomy   . Appendectomy   . Back surgury     History   Social History  . Marital Status: Single    Spouse Name: N/A    Number of Children: N/A  . Years of Education: N/A   Occupational History  . Not on file.   Social History Main Topics  . Smoking status: Former Games developer  . Smokeless tobacco: Not on file  . Alcohol Use:   . Drug Use:   . Sexually Active:    Other Topics Concern  . Not on file   Social History Narrative   Separated 2010Lives alone    Current Outpatient Prescriptions on File Prior to Visit  Medication Sig Dispense Refill  . clonazePAM (KLONOPIN) 0.5 MG  tablet Take 1 tablet (0.5 mg total) by mouth 2 (two) times daily as needed for anxiety.  60 tablet  2  . etodolac (LODINE) 500 MG tablet Take 1 tablet (500 mg total) by mouth daily as needed.  180 tablet  3  . fexofenadine (ALLEGRA) 180 MG tablet Take 1 tablet (180 mg total) by mouth daily.  90 tablet  3  . fluticasone (FLONASE) 50 MCG/ACT nasal spray Place 1 spray into the nose at bedtime. In each nostril  16 g  5  . gabapentin (NEURONTIN) 400 MG capsule Take 1 capsule (400 mg total) by mouth 3 (three) times daily.  270 capsule  1  . glucose blood (ACCU-CHEK AVIVA) test strip Use as instructed. Check blood sugar four times a day       . insulin lispro (HUMALOG) 100 UNIT/ML injection Inject 40 unitsinto the skin 3 (three) times daily before meals.      . insulin NPH (HUMULIN N,NOVOLIN N) 100 UNIT/ML injection Inject 40 Units into the skin at bedtime.      . Insulin Syringe-Needle U-100 (BD INSULIN SYRINGE ULTRAFINE) 31G X 5/16" 1 ML MISC Use as directed four times a day dx 250.01  300 each  2  . Insulin Syringe-Needle U-100 (INSULIN SYRINGE .5CC/30GX1/2") 30G X 1/2" 0.5 ML MISC       . KLONOPIN 0.5  MG tablet TAKE (1) TABLET TWICE DAILY AS NEEDED FOR ANXIETY.  60 each  2  . metoCLOPramide (REGLAN) 10 MG tablet Take 1 tablet (10 mg total) by mouth daily. Take 1 by mouth once daily  90 tablet  3  . metoprolol succinate (TOPROL-XL) 50 MG 24 hr tablet Take 1 tablet (50 mg total) by mouth daily. Take with or immediately following a meal.  90 tablet  3  . nortriptyline (PAMELOR) 25 MG capsule Take 1 capsule (25 mg total) by mouth at bedtime as needed.  90 capsule  1  . olmesartan-hydrochlorothiazide (BENICAR HCT) 20-12.5 MG per tablet Take 1 tablet by mouth daily.  90 tablet  3  . omeprazole (PRILOSEC) 20 MG capsule Take 1 capsule (20 mg total) by mouth daily.  90 capsule  3  . oxyCODONE-acetaminophen (PERCOCET/ROXICET) 5-325 MG per tablet Take 1 tablet by mouth every 6 (six) hours as needed for pain.  120  tablet  0  . rosuvastatin (CRESTOR) 20 MG tablet Take 1 tablet (20 mg total) by mouth daily.  90 tablet  3    Allergies  Allergen Reactions  . Ace Inhibitors   . Baclofen   . Cefaclor   . Chlorzoxazone   . Cloxacillin   . Codeine   . Diclofenac Sodium   . Erythromycin   . Meperidine Hcl   . Nsaids   . Promethazine   . Propoxyphene-Acetaminophen   . Sulfonamide Derivatives     Family History  Problem Relation Age of Onset  . Diabetes Neg Hx     No DM in her immdidate family    BP 128/82  Pulse 94  Temp 97.9 F (36.6 C) (Oral)  Wt 185 lb (83.915 kg)  SpO2 97%  Review of Systems Denies LOC    Objective:   Physical Exam Vital signs: see vs page Gen: elderly, frail, no distress SKIN:  Insulin injection sites at the anterior abdomen are normal  Lab Results  Component Value Date   HGBA1C 6.7* 09/05/2012      Assessment & Plan:  DM, therapy limited by noncompliance with insulin dosing.  i'll do the best i can.

## 2012-09-05 NOTE — Patient Instructions (Addendum)
A diabetes blood test is requested for you today.  We'll contact you with results. Please take the humalog with meals, but not at bedtime.   The NPH (cloudy) is the only insulin you would take at bedtime. check your blood sugar twice a day.  vary the time of day when you check, between before the 3 meals, and at bedtime.  also check if you have symptoms of your blood sugar being too high or too low.  please keep a record of the readings and bring it to your next appointment here.  please call us sooner if your blood sugar goes below 70, or if you have a lot of readings over 200. Please come back for a follow-up appointment in 3 months.

## 2012-09-06 ENCOUNTER — Telehealth: Payer: Self-pay | Admitting: Endocrinology

## 2012-09-06 DIAGNOSIS — IMO0001 Reserved for inherently not codable concepts without codable children: Secondary | ICD-10-CM | POA: Insufficient documentation

## 2012-09-06 NOTE — Telephone Encounter (Signed)
Pt states that Dr. Everardo All put type 1 diabetes on her paperwork during her appointment yesterday, and she is a type 2. She requests that this is fixed. She also complains that we did not give her a fu appt while she was here; however, she has a fu appt scheduled for 12/07/2011 at 9:15. Please remind.

## 2012-09-06 NOTE — Telephone Encounter (Signed)
Pt advised and states an understanding 

## 2012-09-06 NOTE — Telephone Encounter (Signed)
We can't be sure if this is type 1 or 2.  i changed dx to that which does not specify

## 2012-10-02 ENCOUNTER — Telehealth: Payer: Self-pay

## 2012-10-02 MED ORDER — MECLIZINE HCL 25 MG PO CHEW: CHEWABLE_TABLET | ORAL | Status: DC

## 2012-10-02 NOTE — Telephone Encounter (Signed)
Prescription requested has been sent in. 

## 2012-10-02 NOTE — Telephone Encounter (Signed)
Ok - to robin to handle  - for #30 x 1 ref

## 2012-10-02 NOTE — Telephone Encounter (Signed)
[facility] requesting refill on meclizine 25 mg chew qhs for dizziness not on current med list.

## 2012-10-18 ENCOUNTER — Other Ambulatory Visit: Payer: Self-pay

## 2012-10-18 MED ORDER — OXYCODONE-ACETAMINOPHEN 5-325 MG PO TABS: 1.0000 | ORAL_TABLET | Freq: Four times a day (QID) | ORAL | Status: DC | PRN

## 2012-10-18 NOTE — Telephone Encounter (Signed)
Done hardcopy to robin  

## 2012-10-18 NOTE — Telephone Encounter (Signed)
Called the patient informed hardcopy if ready for pickup

## 2012-10-24 ENCOUNTER — Other Ambulatory Visit: Payer: Self-pay

## 2012-10-24 MED ORDER — GABAPENTIN 400 MG PO CAPS: 400.0000 mg | ORAL_CAPSULE | Freq: Three times a day (TID) | ORAL | Status: DC

## 2012-10-24 NOTE — Telephone Encounter (Signed)
Done hardcopy to robin  

## 2012-10-24 NOTE — Telephone Encounter (Signed)
Faxed hardcopy to pharmacy. 

## 2012-11-19 ENCOUNTER — Other Ambulatory Visit: Payer: Self-pay

## 2012-11-19 MED ORDER — CLONAZEPAM 0.5 MG PO TABS: 0.5000 mg | ORAL_TABLET | Freq: Two times a day (BID) | ORAL | Status: DC | PRN

## 2012-11-19 NOTE — Telephone Encounter (Signed)
Done hardcopy to robin  

## 2012-11-20 NOTE — Telephone Encounter (Signed)
Faxed hardcopy to pharmacy. 

## 2012-12-06 ENCOUNTER — Ambulatory Visit: Payer: Medicare Other | Admitting: Endocrinology

## 2012-12-20 ENCOUNTER — Telehealth: Payer: Self-pay

## 2012-12-20 MED ORDER — MECLIZINE HCL 25 MG PO CHEW: CHEWABLE_TABLET | ORAL | Status: DC

## 2012-12-20 NOTE — Telephone Encounter (Signed)
Pharmacy informed of MD instructions 

## 2012-12-20 NOTE — Telephone Encounter (Signed)
OK for med refill, but only for 1 qhs prn due to risk of dizziness and fall if she has to get up at night

## 2012-12-20 NOTE — Telephone Encounter (Signed)
Pharmacy requesting refill on meclizine states the patient take 2 tabs at night.  Please advise on new directions.

## 2013-01-15 DIAGNOSIS — I639 Cerebral infarction, unspecified: Secondary | ICD-10-CM

## 2013-01-15 HISTORY — DX: Cerebral infarction, unspecified: I63.9

## 2013-01-23 ENCOUNTER — Other Ambulatory Visit: Payer: Self-pay

## 2013-01-23 MED ORDER — OXYCODONE-ACETAMINOPHEN 5-325 MG PO TABS: 1.0000 | ORAL_TABLET | Freq: Four times a day (QID) | ORAL | Status: DC | PRN

## 2013-01-23 NOTE — Telephone Encounter (Signed)
Done hardcopy to robin  

## 2013-01-23 NOTE — Telephone Encounter (Signed)
Called the patient informed to pickup hardcopy at the front desk.  Stated she has no way to pickup but is coming in for appointment on 01/29/13. She may have conflict of getting ride to appointment, if not she can pickup rx that day.

## 2013-01-29 ENCOUNTER — Ambulatory Visit (INDEPENDENT_AMBULATORY_CARE_PROVIDER_SITE_OTHER): Payer: Medicare Other | Admitting: Internal Medicine

## 2013-01-29 ENCOUNTER — Telehealth: Payer: Self-pay | Admitting: *Deleted

## 2013-01-29 ENCOUNTER — Ambulatory Visit (INDEPENDENT_AMBULATORY_CARE_PROVIDER_SITE_OTHER)
Admission: RE | Admit: 2013-01-29 | Discharge: 2013-01-29 | Disposition: A | Discharge status: Home / Self Care | Payer: Medicare Other | Source: Ambulatory Visit | Attending: Internal Medicine | Admitting: Internal Medicine

## 2013-01-29 ENCOUNTER — Other Ambulatory Visit (INDEPENDENT_AMBULATORY_CARE_PROVIDER_SITE_OTHER): Payer: Medicare Other

## 2013-01-29 ENCOUNTER — Encounter: Payer: Self-pay | Admitting: Internal Medicine

## 2013-01-29 ENCOUNTER — Other Ambulatory Visit: Payer: Self-pay | Admitting: Internal Medicine

## 2013-01-29 VITALS — BP 172/100 | HR 95 | Temp 98.2°F

## 2013-01-29 DIAGNOSIS — M25532 Pain in left wrist: Secondary | ICD-10-CM

## 2013-01-29 DIAGNOSIS — R29818 Other symptoms and signs involving the nervous system: Secondary | ICD-10-CM

## 2013-01-29 DIAGNOSIS — R296 Repeated falls: Secondary | ICD-10-CM

## 2013-01-29 DIAGNOSIS — S62102A Fracture of unspecified carpal bone, left wrist, initial encounter for closed fracture: Secondary | ICD-10-CM

## 2013-01-29 DIAGNOSIS — E109 Type 1 diabetes mellitus without complications: Secondary | ICD-10-CM

## 2013-01-29 DIAGNOSIS — G8929 Other chronic pain: Secondary | ICD-10-CM

## 2013-01-29 DIAGNOSIS — M549 Dorsalgia, unspecified: Secondary | ICD-10-CM

## 2013-01-29 DIAGNOSIS — IMO0001 Reserved for inherently not codable concepts without codable children: Secondary | ICD-10-CM

## 2013-01-29 DIAGNOSIS — Z9181 History of falling: Secondary | ICD-10-CM

## 2013-01-29 DIAGNOSIS — M25539 Pain in unspecified wrist: Secondary | ICD-10-CM

## 2013-01-29 DIAGNOSIS — R2689 Other abnormalities of gait and mobility: Secondary | ICD-10-CM

## 2013-01-29 DIAGNOSIS — I10 Essential (primary) hypertension: Secondary | ICD-10-CM

## 2013-01-29 DIAGNOSIS — E7800 Pure hypercholesterolemia, unspecified: Secondary | ICD-10-CM

## 2013-01-29 LAB — CBC WITH DIFFERENTIAL/PLATELET
Eosinophils Relative: 1.6 % (ref 0.0–5.0)
HCT: 40.8 % (ref 36.0–46.0)
Hemoglobin: 13.8 g/dL (ref 12.0–15.0)
Lymphs Abs: 1.2 10*3/uL (ref 0.7–4.0)
MCV: 88.5 fl (ref 78.0–100.0)
Monocytes Absolute: 0.5 10*3/uL (ref 0.1–1.0)
Monocytes Relative: 8.2 % (ref 3.0–12.0)
Neutro Abs: 3.9 10*3/uL (ref 1.4–7.7)
Platelets: 168 10*3/uL (ref 150.0–400.0)
RDW: 13.5 % (ref 11.5–14.6)

## 2013-01-29 LAB — TSH: TSH: 0.91 u[IU]/mL (ref 0.35–5.50)

## 2013-01-29 LAB — HEPATIC FUNCTION PANEL
AST: 18 U/L (ref 0–37)
Albumin: 3.5 g/dL (ref 3.5–5.2)
Alkaline Phosphatase: 116 U/L (ref 39–117)
Total Bilirubin: 0.6 mg/dL (ref 0.3–1.2)

## 2013-01-29 LAB — LIPID PANEL
HDL: 28.3 mg/dL — ABNORMAL LOW (ref >39.00)
Total CHOL/HDL Ratio: 9
Triglycerides: 135 mg/dL (ref 0.0–149.0)
VLDL: 27 mg/dL (ref 0.0–40.0)

## 2013-01-29 LAB — LDL CHOLESTEROL, DIRECT: Direct LDL: 220 mg/dL

## 2013-01-29 LAB — BASIC METABOLIC PANEL
Calcium: 8.8 mg/dL (ref 8.4–10.5)
GFR: 109.25 mL/min (ref >60.00)
Potassium: 3.6 mEq/L (ref 3.5–5.1)
Sodium: 137 mEq/L (ref 135–145)

## 2013-01-29 MED ORDER — GABAPENTIN 400 MG PO CAPS: 400.0000 mg | ORAL_CAPSULE | Freq: Three times a day (TID) | ORAL | Status: DC

## 2013-01-29 MED ORDER — FLUTICASONE PROPIONATE 50 MCG/ACT NA SUSP: 1.0000 | Freq: Every day | NASAL | Status: AC

## 2013-01-29 MED ORDER — METOPROLOL SUCCINATE ER 50 MG PO TB24: 50.0000 mg | ORAL_TABLET | Freq: Every day | ORAL | Status: DC

## 2013-01-29 MED ORDER — FEXOFENADINE HCL 180 MG PO TABS: 180.0000 mg | ORAL_TABLET | Freq: Every day | ORAL | Status: AC

## 2013-01-29 MED ORDER — ATORVASTATIN CALCIUM 10 MG PO TABS: 10.0000 mg | ORAL_TABLET | Freq: Every day | ORAL | Status: DC

## 2013-01-29 MED ORDER — IRBESARTAN-HYDROCHLOROTHIAZIDE 300-12.5 MG PO TABS: 1.0000 | ORAL_TABLET | Freq: Every day | ORAL | Status: DC

## 2013-01-29 MED ORDER — OMEPRAZOLE 20 MG PO CPDR: 20.0000 mg | DELAYED_RELEASE_CAPSULE | Freq: Every day | ORAL | Status: AC

## 2013-01-29 MED ORDER — HYDROCODONE-ACETAMINOPHEN 5-325 MG PO TABS: 1.0000 | ORAL_TABLET | Freq: Four times a day (QID) | ORAL | Status: DC | PRN

## 2013-01-29 MED ORDER — NORTRIPTYLINE HCL 25 MG PO CAPS: 25.0000 mg | ORAL_CAPSULE | Freq: Every evening | ORAL | Status: DC | PRN

## 2013-01-29 NOTE — Telephone Encounter (Signed)
Call-report (L) wrist  Subtle lucency mid scaphoid bone. Suspicious for non displaced fracture. Also stated will fax md copy...lmb

## 2013-01-29 NOTE — Progress Notes (Signed)
Subjective:    Patient ID: [name], female    DOB: [birth year], 79 y.o.   MRN: [id number]  HPI  Here to f/u, brought per EMS per pt request as she lives alone, no family, and hard to stand or walk with worsening back pain, balance problem and 3 falls in the past month, last one yesterday with large painful bruise to left wrist.  Has not been able to get her chronic pain med recently as she had no transport to come pick up the rx personally as per office policy.  Has been out of most meds includiing the benicar HCT for at least 2 wks, with worsening LE edema below knees, better in the AM.  Her pharmacy will deliver, but benicarHCT and crestor too expensive.   Pt denies fever, wt loss, night sweats, loss of appetite, or other constitutional symptoms, though has general weakenss.  Normally walks with walker at home.  Pt denies polydipsia, polyuria,   Pt states overall good compliance with meds, trying to follow lower cholesterol, diabetic diet. Past Medical History  Diagnosis Date  . Encounter for long-term (current) use of other medications     Anitihyperlipidemic use, Long term  . DIABETES MELLITUS, TYPE I 05/30/2007  . HYPERCHOLESTEROLEMIA 10/23/2008  . HYPERTENSION 05/30/2007  . OSTEOARTHRITIS, LUMBAR SPINE 12/25/2007  . Depression 10/29/2011  . GERD (gastroesophageal reflux disease) 10/29/2011  . Diastolic CHF, chronic   . CAD (coronary artery disease)   . Anxiety problem   . Wedge compression fracture of T11-T12 vertebra   . Lumbar degenerative disc disease   . Anemia, unspecified   . Lumbar spinal stenosis   . Allergic rhinitis, cause unspecified 10/29/2011  . Osteoporosis 10/29/2011  . Failure to thrive in childhood 10/29/2011  . Osteoarthritis of right knee 10/29/2011  . History of colon polyps 11/01/2011  . Chronic pain 11/01/2011   Past Surgical History  Procedure Laterality Date  . Abdominal hysterectomy    . Cholecystectomy    . Appendectomy    . Back surgury      reports  that she has quit smoking. She does not have any smokeless tobacco history on file. Her alcohol and drug histories are not on file. family history is negative for Diabetes. Allergies  Allergen Reactions  . Ace Inhibitors   . Baclofen   . Cefaclor   . Chlorzoxazone   . Cloxacillin   . Codeine   . Diclofenac Sodium   . Erythromycin   . Meperidine Hcl   . Nsaids   . Promethazine   . Propoxyphene-Acetaminophen   . Sulfonamide Derivatives    Current Outpatient Prescriptions on File Prior to Visit  Medication Sig Dispense Refill  . glucose blood (ACCU-CHEK AVIVA) test strip Use as instructed. Check blood sugar four times a day       . insulin lispro (HUMALOG) 100 UNIT/ML injection Inject 40 unitsinto the skin 3 (three) times daily before meals.      . insulin NPH (HUMULIN N,NOVOLIN N) 100 UNIT/ML injection Inject 40 Units into the skin at bedtime.      . Insulin Syringe-Needle U-100 (BD INSULIN SYRINGE ULTRAFINE) 31G X 5/16" 1 ML MISC Use as directed four times a day dx 250.01  300 each  2  . Insulin Syringe-Needle U-100 (INSULIN SYRINGE .5CC/30GX1/2") 30G X 1/2" 0.5 ML MISC       . KLONOPIN 0.5 MG tablet TAKE (1) TABLET TWICE DAILY AS NEEDED FOR ANXIETY.  60 each  2  . metoCLOPramide (  REGLAN) 10 MG tablet Take 1 tablet (10 mg total) by mouth daily. Take 1 by mouth once daily  90 tablet  3  . clonazePAM (KLONOPIN) 0.5 MG tablet Take 1 tablet (0.5 mg total) by mouth 2 (two) times daily as needed for anxiety.  60 tablet  2   No current facility-administered medications on file prior to visit.   Review of Systems  Constitutional: Negative for unexpected weight change, or unusual diaphoresis  HENT: Negative for tinnitus.   Eyes: Negative for photophobia and visual disturbance.  Respiratory: Negative for choking and stridor.   Gastrointestinal: Negative for vomiting and blood in stool.  Genitourinary: Negative for hematuria and decreased urine volume.  Musculoskeletal: Negative for acute  joint swelling Skin: Negative for color change and wound.  Neurological: Negative for tremors and numbness other than noted  Psychiatric/Behavioral: Negative for decreased concentration or  hyperactivity.       Objective:   Physical Exam BP 172/100  Pulse 95  Temp(Src) 98.2 F (36.8 C) (Oral)  SpO2 97% VS noted, examined in gurney, pt moves all 4s, alert and cooperative, lucid, in pain Constitutional: Pt appears well-developed and well-nourished.  HENT: Head: NCAT.  Right Ear: External ear normal.  Left Ear: External ear normal.  Eyes: Conjunctivae and EOM are normal. Pupils are equal, round, and reactive to light.  Neck: Normal range of motion. Neck supple.  Cardiovascular: Normal rate and regular rhythm.   Pulmonary/Chest: Effort normal and breath sounds normal. - no rales or wheezing Abd:  Soft, NT, non-distended, + BS Neurological: Pt is alert. Not confused , motor 5/5 but general weak, gait not tested per pt Skin: Skin is warm. No erythema. Large bruise left wrist and right lower back Psychiatric: Pt behavior is normal. Thought content normal.  LE with 1+ edema bilat to knees Spine: diffuse tender, no swelling or redness, rash    Assessment & Plan:

## 2013-01-29 NOTE — Patient Instructions (Addendum)
Your EKG was ok today We will refer you to [facility] (the local doctor group that takes care of things that can help keep you out of the hospital) - Robin to call Please go to the XRAY Department in the Basement (go straight as you get off the elevator) for the x-ray testing - the left wrist xray, back and pelvis You will be contacted regarding the referral for: MRI head (to see PCC's now) You will be contacted regarding the referral for: Home Physical Therapy, and Neurology referral OK to stop the benicar HCT, and the Crestor, and oxycodone (since you are not taking anyway) Please take all new medication as prescribed - generic Avalide (for blood pressure and leg swelling), generic Lipitor for cholesterol, and Hydrocodone for pain All of your medications were refilled except for the Insulin (should be done per Dr Everardo All) Please keep your appointments with your specialists as you have planned  - Dr Everardo All for Diabetes Please go to the LAB in the Basement (turn left off the elevator) for the tests to be done today You will be contacted by phone if any changes need to be made immediately.  Otherwise, you will receive a letter about your results with an explanation Please remember to sign up for My Chart if you have not done so, as this will be important to you in the future with finding out test results, communicating by private email, and scheduling acute appointments online when needed. If you do ok with Home Physical Therapy with better walking with your walker, you could probably apply for the [employer] bus for transportation Please return in 1 months, or sooner if needed

## 2013-01-29 NOTE — Assessment & Plan Note (Addendum)
ECG reviewed as per emr, likely mutlifactorial, would start eval with Head MRI, and neurology referral, and labs today  Note:  Total time for pt hx, exam, review of record with pt in the room, determination of diagnoses and plan for further eval and tx is > 40 min, with over 50% spent in coordination and counseling of patient

## 2013-01-31 DIAGNOSIS — M25539 Pain in unspecified wrist: Secondary | ICD-10-CM | POA: Insufficient documentation

## 2013-01-31 NOTE — Assessment & Plan Note (Signed)
For change oxycodone to hydrocodone prn,  to f/u any worsening symptoms or concerns

## 2013-01-31 NOTE — Assessment & Plan Note (Signed)
?   fx - for film today, pain control

## 2013-01-31 NOTE — Assessment & Plan Note (Signed)
?   Control but symptomatically stable overall by history and exam, recent data reviewed with pt, and pt to continue medical treatment as before,  to f/u any worsening symptoms or concerns Lab Results  Component Value Date   HGBA1C 9.0* 01/29/2013

## 2013-01-31 NOTE — Assessment & Plan Note (Signed)
For change crestor to lipitor generic, has not taken in several wks, doubt related to falls

## 2013-01-31 NOTE — Assessment & Plan Note (Signed)
For change benciar hct to generic avalide, should help LE edema as well

## 2013-02-01 ENCOUNTER — Inpatient Hospital Stay (HOSPITAL_COMMUNITY)
Admission: EM | Admit: 2013-02-01 | Discharge: 2013-02-08 | DRG: 065 | Disposition: A | Discharge status: Skilled Nursing Facility | Payer: Medicare Other | Attending: Internal Medicine | Admitting: Internal Medicine

## 2013-02-01 ENCOUNTER — Inpatient Hospital Stay (HOSPITAL_COMMUNITY): Payer: Medicare Other

## 2013-02-01 ENCOUNTER — Emergency Department (HOSPITAL_COMMUNITY): Payer: Medicare Other

## 2013-02-01 ENCOUNTER — Encounter (HOSPITAL_COMMUNITY): Payer: Self-pay | Admitting: Emergency Medicine

## 2013-02-01 DIAGNOSIS — G8929 Other chronic pain: Secondary | ICD-10-CM

## 2013-02-01 DIAGNOSIS — M5137 Other intervertebral disc degeneration, lumbosacral region: Secondary | ICD-10-CM | POA: Diagnosis present

## 2013-02-01 DIAGNOSIS — M810 Age-related osteoporosis without current pathological fracture: Secondary | ICD-10-CM | POA: Diagnosis present

## 2013-02-01 DIAGNOSIS — R6251 Failure to thrive (child): Secondary | ICD-10-CM | POA: Diagnosis present

## 2013-02-01 DIAGNOSIS — I6340 Cerebral infarction due to embolism of unspecified cerebral artery: Principal | ICD-10-CM | POA: Diagnosis present

## 2013-02-01 DIAGNOSIS — Z860100 Personal history of colon polyps, unspecified: Secondary | ICD-10-CM

## 2013-02-01 DIAGNOSIS — Z79899 Other long term (current) drug therapy: Secondary | ICD-10-CM

## 2013-02-01 DIAGNOSIS — F32A Depression, unspecified: Secondary | ICD-10-CM | POA: Diagnosis present

## 2013-02-01 DIAGNOSIS — I2510 Atherosclerotic heart disease of native coronary artery without angina pectoris: Secondary | ICD-10-CM

## 2013-02-01 DIAGNOSIS — R5383 Other fatigue: Secondary | ICD-10-CM

## 2013-02-01 DIAGNOSIS — F3289 Other specified depressive episodes: Secondary | ICD-10-CM | POA: Diagnosis present

## 2013-02-01 DIAGNOSIS — M51379 Other intervertebral disc degeneration, lumbosacral region without mention of lumbar back pain or lower extremity pain: Secondary | ICD-10-CM | POA: Diagnosis present

## 2013-02-01 DIAGNOSIS — I509 Heart failure, unspecified: Secondary | ICD-10-CM | POA: Diagnosis present

## 2013-02-01 DIAGNOSIS — R29898 Other symptoms and signs involving the musculoskeletal system: Secondary | ICD-10-CM | POA: Diagnosis present

## 2013-02-01 DIAGNOSIS — I10 Essential (primary) hypertension: Secondary | ICD-10-CM | POA: Diagnosis present

## 2013-02-01 DIAGNOSIS — M479 Spondylosis, unspecified: Secondary | ICD-10-CM

## 2013-02-01 DIAGNOSIS — I5032 Chronic diastolic (congestive) heart failure: Secondary | ICD-10-CM | POA: Diagnosis present

## 2013-02-01 DIAGNOSIS — J309 Allergic rhinitis, unspecified: Secondary | ICD-10-CM

## 2013-02-01 DIAGNOSIS — R251 Tremor, unspecified: Secondary | ICD-10-CM

## 2013-02-01 DIAGNOSIS — Z0000 Encounter for general adult medical examination without abnormal findings: Secondary | ICD-10-CM

## 2013-02-01 DIAGNOSIS — E7800 Pure hypercholesterolemia, unspecified: Secondary | ICD-10-CM | POA: Diagnosis present

## 2013-02-01 DIAGNOSIS — M1711 Unilateral primary osteoarthritis, right knee: Secondary | ICD-10-CM

## 2013-02-01 DIAGNOSIS — IMO0001 Reserved for inherently not codable concepts without codable children: Secondary | ICD-10-CM | POA: Diagnosis present

## 2013-02-01 DIAGNOSIS — F411 Generalized anxiety disorder: Secondary | ICD-10-CM | POA: Diagnosis present

## 2013-02-01 DIAGNOSIS — D649 Anemia, unspecified: Secondary | ICD-10-CM

## 2013-02-01 DIAGNOSIS — M5136 Other intervertebral disc degeneration, lumbar region: Secondary | ICD-10-CM

## 2013-02-01 DIAGNOSIS — I6350 Cerebral infarction due to unspecified occlusion or stenosis of unspecified cerebral artery: Secondary | ICD-10-CM

## 2013-02-01 DIAGNOSIS — R739 Hyperglycemia, unspecified: Secondary | ICD-10-CM

## 2013-02-01 DIAGNOSIS — K219 Gastro-esophageal reflux disease without esophagitis: Secondary | ICD-10-CM

## 2013-02-01 DIAGNOSIS — R209 Unspecified disturbances of skin sensation: Secondary | ICD-10-CM

## 2013-02-01 DIAGNOSIS — Z794 Long term (current) use of insulin: Secondary | ICD-10-CM

## 2013-02-01 DIAGNOSIS — Z87891 Personal history of nicotine dependence: Secondary | ICD-10-CM

## 2013-02-01 DIAGNOSIS — M1990 Unspecified osteoarthritis, unspecified site: Secondary | ICD-10-CM | POA: Diagnosis present

## 2013-02-01 DIAGNOSIS — F329 Major depressive disorder, single episode, unspecified: Secondary | ICD-10-CM | POA: Diagnosis present

## 2013-02-01 DIAGNOSIS — M51369 Other intervertebral disc degeneration, lumbar region without mention of lumbar back pain or lower extremity pain: Secondary | ICD-10-CM | POA: Diagnosis present

## 2013-02-01 DIAGNOSIS — R296 Repeated falls: Secondary | ICD-10-CM

## 2013-02-01 DIAGNOSIS — E876 Hypokalemia: Secondary | ICD-10-CM | POA: Diagnosis present

## 2013-02-01 DIAGNOSIS — M48061 Spinal stenosis, lumbar region without neurogenic claudication: Secondary | ICD-10-CM

## 2013-02-01 DIAGNOSIS — E785 Hyperlipidemia, unspecified: Secondary | ICD-10-CM | POA: Diagnosis present

## 2013-02-01 DIAGNOSIS — I639 Cerebral infarction, unspecified: Secondary | ICD-10-CM

## 2013-02-01 LAB — URINALYSIS, ROUTINE W REFLEX MICROSCOPIC
Glucose, UA: >1000 mg/dL — AB
Ketones, ur: >80 mg/dL — AB
Leukocytes, UA: NEGATIVE
Nitrite: NEGATIVE
Protein, ur: 30 mg/dL — AB
Urobilinogen, UA: 1 mg/dL (ref 0.0–1.0)

## 2013-02-01 LAB — DIFFERENTIAL
Basophils Relative: 0 % (ref 0–1)
Eosinophils Absolute: 0 10*3/uL (ref 0.0–0.7)
Lymphocytes Relative: 9 % — ABNORMAL LOW (ref 12–46)

## 2013-02-01 LAB — CBC
HCT: 45.4 % (ref 36.0–46.0)
HCT: 46.7 % — ABNORMAL HIGH (ref 36.0–46.0)
Hemoglobin: 16.5 g/dL — ABNORMAL HIGH (ref 12.0–15.0)
MCH: 30.2 pg (ref 26.0–34.0)
MCHC: 35.3 g/dL (ref 30.0–36.0)
MCV: 87 fL (ref 78.0–100.0)
RDW: 12.7 % (ref 11.5–15.5)
RDW: 12.9 % (ref 11.5–15.5)
WBC: 8.1 10*3/uL (ref 4.0–10.5)

## 2013-02-01 LAB — RAPID URINE DRUG SCREEN, HOSP PERFORMED
Amphetamines: NOT DETECTED
Barbiturates: NOT DETECTED
Benzodiazepines: NOT DETECTED
Tetrahydrocannabinol: NOT DETECTED

## 2013-02-01 LAB — COMPREHENSIVE METABOLIC PANEL
ALT: 23 U/L (ref 0–35)
Albumin: 3.6 g/dL (ref 3.5–5.2)
Alkaline Phosphatase: 163 U/L — ABNORMAL HIGH (ref 39–117)
Chloride: 100 mEq/L (ref 96–112)
Potassium: 4 mEq/L (ref 3.5–5.1)
Sodium: 138 mEq/L (ref 135–145)
Total Bilirubin: 0.7 mg/dL (ref 0.3–1.2)
Total Protein: 7.8 g/dL (ref 6.0–8.3)

## 2013-02-01 LAB — ETHANOL: Alcohol, Ethyl (B): <11 mg/dL (ref 0–11)

## 2013-02-01 LAB — POCT I-STAT, CHEM 8
BUN: 17 mg/dL (ref 6–23)
Calcium, Ion: 1.12 mmol/L — ABNORMAL LOW (ref 1.13–1.30)
Creatinine, Ser: 0.6 mg/dL (ref 0.50–1.10)
TCO2: 25 mmol/L (ref 0–100)

## 2013-02-01 LAB — URINE MICROSCOPIC-ADD ON

## 2013-02-01 LAB — GLUCOSE, CAPILLARY: Glucose-Capillary: 286 mg/dL — ABNORMAL HIGH (ref 70–99)

## 2013-02-01 LAB — TROPONIN I: Troponin I: <0.3 ng/mL (ref <0.30)

## 2013-02-01 LAB — MRSA PCR SCREENING: MRSA by PCR: NEGATIVE

## 2013-02-01 LAB — CK TOTAL AND CKMB (NOT AT ARMC): Relative Index: 0.4 (ref 0.0–2.5)

## 2013-02-01 LAB — CREATININE, SERUM: GFR calc non Af Amer: >90 mL/min (ref >90)

## 2013-02-01 MED ORDER — SENNOSIDES-DOCUSATE SODIUM 8.6-50 MG PO TABS
1.0000 | ORAL_TABLET | Freq: Every evening | ORAL | Status: DC | PRN
  Filled 2013-02-01: qty 1

## 2013-02-01 MED ORDER — GABAPENTIN 400 MG PO CAPS
400.0000 mg | ORAL_CAPSULE | Freq: Three times a day (TID) | ORAL | Status: DC
  Filled 2013-02-01 (×2): qty 1

## 2013-02-01 MED ORDER — CLONAZEPAM 0.5 MG PO TABS: 0.2500 mg | ORAL_TABLET | Freq: Two times a day (BID) | ORAL | Status: DC | PRN

## 2013-02-01 MED ORDER — METOPROLOL SUCCINATE ER 50 MG PO TB24
50.0000 mg | ORAL_TABLET | Freq: Every day | ORAL | Status: DC
  Administered 2013-02-02 – 2013-02-05 (×4): 50 mg via ORAL
  Filled 2013-02-01 (×6): qty 1

## 2013-02-01 MED ORDER — LABETALOL HCL 5 MG/ML IV SOLN
10.0000 mg | Freq: Four times a day (QID) | INTRAVENOUS | Status: DC | PRN
  Administered 2013-02-01 – 2013-02-02 (×2): 10 mg via INTRAVENOUS
  Filled 2013-02-01 (×3): qty 4

## 2013-02-01 MED ORDER — HYDROCHLOROTHIAZIDE 12.5 MG PO CAPS
12.5000 mg | ORAL_CAPSULE | Freq: Every day | ORAL | Status: DC
  Administered 2013-02-02: 12.5 mg via ORAL
  Filled 2013-02-01 (×3): qty 1

## 2013-02-01 MED ORDER — ONDANSETRON HCL 4 MG/2ML IJ SOLN: 4.0000 mg | Freq: Three times a day (TID) | INTRAMUSCULAR | Status: AC | PRN

## 2013-02-01 MED ORDER — IRBESARTAN-HYDROCHLOROTHIAZIDE 300-12.5 MG PO TABS: 1.0000 | ORAL_TABLET | Freq: Every day | ORAL | Status: DC

## 2013-02-01 MED ORDER — PANTOPRAZOLE SODIUM 40 MG IV SOLR
40.0000 mg | Freq: Once | INTRAVENOUS | Status: AC
  Administered 2013-02-01: 40 mg via INTRAVENOUS
  Filled 2013-02-01 (×2): qty 40

## 2013-02-01 MED ORDER — IRBESARTAN 300 MG PO TABS
300.0000 mg | ORAL_TABLET | Freq: Every day | ORAL | Status: DC
  Administered 2013-02-02: 300 mg via ORAL
  Filled 2013-02-01 (×3): qty 1

## 2013-02-01 MED ORDER — INSULIN ASPART 100 UNIT/ML ~~LOC~~ SOLN: 0.0000 [IU] | Freq: Three times a day (TID) | SUBCUTANEOUS | Status: DC

## 2013-02-01 MED ORDER — CLONAZEPAM 0.5 MG PO TABS
0.2500 mg | ORAL_TABLET | Freq: Every evening | ORAL | Status: DC | PRN
  Administered 2013-02-03: 0.25 mg via ORAL
  Filled 2013-02-01: qty 1

## 2013-02-01 MED ORDER — INSULIN ASPART 100 UNIT/ML ~~LOC~~ SOLN
0.0000 [IU] | Freq: Every day | SUBCUTANEOUS | Status: DC
  Administered 2013-02-01: 5 [IU] via SUBCUTANEOUS

## 2013-02-01 MED ORDER — ASPIRIN 325 MG PO TABS
325.0000 mg | ORAL_TABLET | Freq: Every day | ORAL | Status: DC
  Administered 2013-02-02 – 2013-02-08 (×7): 325 mg via ORAL
  Filled 2013-02-01 (×8): qty 1

## 2013-02-01 MED ORDER — HYDRALAZINE HCL 20 MG/ML IJ SOLN
10.0000 mg | Freq: Four times a day (QID) | INTRAMUSCULAR | Status: DC | PRN
  Administered 2013-02-01 – 2013-02-02 (×2): 10 mg via INTRAVENOUS
  Filled 2013-02-01 (×2): qty 1

## 2013-02-01 MED ORDER — FLUTICASONE PROPIONATE 50 MCG/ACT NA SUSP
1.0000 | Freq: Every day | NASAL | Status: DC
  Administered 2013-02-02 – 2013-02-07 (×6): 1 via NASAL
  Filled 2013-02-01 (×2): qty 16

## 2013-02-01 MED ORDER — PANTOPRAZOLE SODIUM 40 MG PO TBEC: 40.0000 mg | DELAYED_RELEASE_TABLET | Freq: Every day | ORAL | Status: DC

## 2013-02-01 MED ORDER — ATORVASTATIN CALCIUM 10 MG PO TABS
10.0000 mg | ORAL_TABLET | Freq: Every day | ORAL | Status: DC
  Administered 2013-02-02: 10 mg via ORAL
  Filled 2013-02-01 (×3): qty 1

## 2013-02-01 MED ORDER — ENOXAPARIN SODIUM 40 MG/0.4ML ~~LOC~~ SOLN
40.0000 mg | SUBCUTANEOUS | Status: DC
  Administered 2013-02-01 – 2013-02-07 (×7): 40 mg via SUBCUTANEOUS
  Filled 2013-02-01 (×9): qty 0.4

## 2013-02-01 MED ORDER — ASPIRIN 300 MG RE SUPP
300.0000 mg | Freq: Every day | RECTAL | Status: DC
  Administered 2013-02-01: 300 mg via RECTAL
  Filled 2013-02-01 (×8): qty 1

## 2013-02-01 MED ORDER — METOCLOPRAMIDE HCL 10 MG PO TABS
10.0000 mg | ORAL_TABLET | Freq: Every day | ORAL | Status: DC
  Administered 2013-02-02 – 2013-02-07 (×6): 10 mg via ORAL
  Filled 2013-02-01 (×8): qty 1

## 2013-02-01 NOTE — Consult Note (Addendum)
NEURO HOSPITALIST CONSULT NOTE    Reason for Consult: Stroke  LSN: [id number] Last time normal: unknown Tpa not given due to unknown time LSN  HPI:                                                                                                                                          [name] is an 79 y.o. female   Who was brought to her PCP on 01/29/13 by "EMS per pt request as she lives alone, no family, and hard to stand or walk with worsening back pain, balance problem and 3 falls in the past month, last one yesterday with large painful bruise to left wrist. Has not been able to get her chronic pain med recently as she had no transport to come pick up the rx personally as per office policy. Has been out of most meds includiing the benicar HCT for at least 2 wks, with worsening LE edema below knees, better in the AM." Patient has no further records on file. Her friends came over and patient was not answering her door.  Due to concern they opened the door and found her in her recliner, in urine and not moving.  Patient was brought to ED and found to have Left UE and LE weakness. CT head showed an area of decreased attenuation in the right occipital / posterior right parietal lobe this is suspicious for evolving ischemia. Neurology was consulted to see patient for worrisome infarct. Patient admits to missing her ASA over last few days.     Past Medical History  Diagnosis Date  . Encounter for long-term (current) use of other medications     Anitihyperlipidemic use, Long term  . DIABETES MELLITUS, TYPE I 05/30/2007  . HYPERCHOLESTEROLEMIA 10/23/2008  . HYPERTENSION 05/30/2007  . OSTEOARTHRITIS, LUMBAR SPINE 12/25/2007  . Depression 10/29/2011  . GERD (gastroesophageal reflux disease) 10/29/2011  . Diastolic CHF, chronic   . CAD (coronary artery disease)   . Anxiety problem   . Wedge compression fracture of T11-T12 vertebra   . Lumbar degenerative disc disease   .  Anemia, unspecified   . Lumbar spinal stenosis   . Allergic rhinitis, cause unspecified 10/29/2011  . Osteoporosis 10/29/2011  . Failure to thrive in childhood 10/29/2011  . Osteoarthritis of right knee 10/29/2011  . History of colon polyps 11/01/2011  . Chronic pain 11/01/2011    Past Surgical History  Procedure Laterality Date  . Abdominal hysterectomy    . Cholecystectomy    . Appendectomy    . Back surgury      Family History  Problem Relation Age of Onset  . Diabetes Neg Hx     No DM in her immdidate family    Social History:  reports that she  has quit smoking. She does not have any smokeless tobacco history on file. Her alcohol and drug histories are not on file.  Allergies  Allergen Reactions  . Ace Inhibitors   . Baclofen   . Cefaclor   . Chlorzoxazone   . Cloxacillin   . Codeine   . Diclofenac Sodium   . Erythromycin   . Meperidine Hcl   . Nsaids   . Promethazine   . Propoxyphene-Acetaminophen   . Sulfonamide Derivatives     MEDICATIONS:                                                                                                                     No current facility-administered medications for this encounter.   Current Outpatient Prescriptions  Medication Sig Dispense Refill  . atorvastatin (LIPITOR) 10 MG tablet Take 1 tablet (10 mg total) by mouth daily.  90 tablet  3  . clonazePAM (KLONOPIN) 0.5 MG tablet Take 1 tablet (0.5 mg total) by mouth 2 (two) times daily as needed for anxiety.  60 tablet  2  . fexofenadine (ALLEGRA) 180 MG tablet Take 1 tablet (180 mg total) by mouth daily.  90 tablet  3  . fluticasone (FLONASE) 50 MCG/ACT nasal spray Place 1 spray into the nose at bedtime. In each nostril  16 g  5  . gabapentin (NEURONTIN) 400 MG capsule Take 1 capsule (400 mg total) by mouth 3 (three) times daily.  270 capsule  1  . glucose blood (ACCU-CHEK AVIVA) test strip Use as instructed. Check blood sugar four times a day       .  HYDROcodone-acetaminophen (NORCO/VICODIN) 5-325 MG per tablet Take 1 tablet by mouth every 6 (six) hours as needed for pain.  120 tablet  1  . insulin lispro (HUMALOG) 100 UNIT/ML injection Inject 40 unitsinto the skin 3 (three) times daily before meals.      . insulin NPH (HUMULIN N,NOVOLIN N) 100 UNIT/ML injection Inject 40 Units into the skin at bedtime.      . Insulin Syringe-Needle U-100 (BD INSULIN SYRINGE ULTRAFINE) 31G X 5/16" 1 ML MISC Use as directed four times a day dx 250.01  300 each  2  . Insulin Syringe-Needle U-100 (INSULIN SYRINGE .5CC/30GX1/2") 30G X 1/2" 0.5 ML MISC       . irbesartan-hydrochlorothiazide (AVALIDE) 300-12.5 MG per tablet Take 1 tablet by mouth daily.  90 tablet  3  . KLONOPIN 0.5 MG tablet TAKE (1) TABLET TWICE DAILY AS NEEDED FOR ANXIETY.  60 each  2  . metoCLOPramide (REGLAN) 10 MG tablet Take 1 tablet (10 mg total) by mouth daily. Take 1 by mouth once daily  90 tablet  3  . metoprolol succinate (TOPROL-XL) 50 MG 24 hr tablet Take 1 tablet (50 mg total) by mouth daily. Take with or immediately following a meal.  90 tablet  3  . nortriptyline (PAMELOR) 25 MG capsule Take 1 capsule (25 mg total) by mouth at bedtime as needed.  90 capsule  1  . omeprazole (PRILOSEC) 20 MG capsule Take 1 capsule (20 mg total) by mouth daily.  90 capsule  3      ROS:                                                                                                                                       History obtained from the patient and unobtainable from patient due to mental status   Blood pressure 215/86, pulse 106, temperature 99.4 F (37.4 C), temperature source Oral, resp. rate 23, SpO2 97.00%.   Neurologic Examination:                                                                                                      Mental Status: Alert, answers some questions with one to two word answers.  Speech fluent without evidence of aphasia.  Able to follow 3 step commands  without difficulty. She has a left neglect Cranial Nerves: II: Discs flat bilaterally; Visual fields shows a left hemianopsia, pupils equal, round, reactive to light and accommodation III,IV, VI: ptosis not present, extra-ocular motions intact bilaterally but has a right gaze preference V,VII: smile symmetric, facial light touch sensation decreased on the left VIII: hearing normal bilaterally IX,X: gag reflex present XI: bilateral shoulder shrug XII: midline tongue extension Motor: Right : Upper extremity   5/5    Left:     Upper extremity   At least 4/5  Lower extremity   5/5     Lower extremity   At least 4/5 Tone and bulk:normal tone throughout; no atrophy noted Sensory: decreased on the left arm and leg.  Repeated "both" to all stimulation when testing DSS Deep Tendon Reflexes: 2+ UE and 1+ KJ no AJ Plantars: Right: downgoing   Left: down going Cerebellar: normal finger-to-nose on right CV: pulses palpable throughout    Lab Results  Component Value Date/Time   CHOL 266* 01/29/2013 11:20 AM    Results for orders placed during the hospital encounter of 02/01/13 (from the past 48 hour(s))  GLUCOSE, CAPILLARY     Status: Abnormal   Collection Time    02/01/13  1:50 PM      Result Value Range   Glucose-Capillary 321 (*) 70 - 99 mg/dL  ETHANOL     Status: None   Collection Time    02/01/13  1:51 PM      Result Value Range   Alcohol, Ethyl (B) <11  0 - 11 mg/dL   Comment:  LOWEST DETECTABLE LIMIT FOR     SERUM ALCOHOL IS 11 mg/dL     FOR MEDICAL PURPOSES ONLY  PROTIME-INR     Status: None   Collection Time    02/01/13  1:51 PM      Result Value Range   Prothrombin Time 14.2  11.6 - 15.2 seconds   INR 1.11  0.00 - 1.49  APTT     Status: None   Collection Time    02/01/13  1:51 PM      Result Value Range   aPTT 27  24 - 37 seconds  CBC     Status: Abnormal   Collection Time    02/01/13  1:51 PM      Result Value Range   WBC 8.1  4.0 - 10.5 K/uL   RBC  5.22 (*) 3.87 - 5.11 MIL/uL   Hemoglobin 15.7 (*) 12.0 - 15.0 g/dL   HCT 16.1  09.6 - 04.5 %   MCV 87.0  78.0 - 100.0 fL   MCH 30.1  26.0 - 34.0 pg   MCHC 34.6  30.0 - 36.0 g/dL   RDW 40.9  81.1 - 91.4 %   Platelets 183  150 - 400 K/uL  DIFFERENTIAL     Status: Abnormal   Collection Time    02/01/13  1:51 PM      Result Value Range   Neutrophils Relative 82 (*) 43 - 77 %   Neutro Abs 6.6  1.7 - 7.7 K/uL   Lymphocytes Relative 9 (*) 12 - 46 %   Lymphs Abs 0.7  0.7 - 4.0 K/uL   Monocytes Relative 9  3 - 12 %   Monocytes Absolute 0.7  0.1 - 1.0 K/uL   Eosinophils Relative 0  0 - 5 %   Eosinophils Absolute 0.0  0.0 - 0.7 K/uL   Basophils Relative 0  0 - 1 %   Basophils Absolute 0.0  0.0 - 0.1 K/uL  COMPREHENSIVE METABOLIC PANEL     Status: Abnormal   Collection Time    02/01/13  1:51 PM      Result Value Range   Sodium 138  135 - 145 mEq/L   Potassium 4.0  3.5 - 5.1 mEq/L   Chloride 100  96 - 112 mEq/L   CO2 22  19 - 32 mEq/L   Glucose, Bld 314 (*) 70 - 99 mg/dL   BUN 17  6 - 23 mg/dL   Creatinine, Ser 7.82  0.50 - 1.10 mg/dL   Calcium 9.8  8.4 - 95.6 mg/dL   Total Protein 7.8  6.0 - 8.3 g/dL   Albumin 3.6  3.5 - 5.2 g/dL   AST 41 (*) 0 - 37 U/L   ALT 23  0 - 35 U/L   Alkaline Phosphatase 163 (*) 39 - 117 U/L   Total Bilirubin 0.7  0.3 - 1.2 mg/dL   GFR calc non Af Amer 89 (*) >90 mL/min   GFR calc Af Amer >90  >90 mL/min   Comment:            The eGFR has been calculated     using the CKD EPI equation.     This calculation has not been     validated in all clinical     situations.     eGFR's persistently     <90 mL/min signify     possible Chronic Kidney Disease.  TROPONIN I     Status: None  Collection Time    02/01/13  1:55 PM      Result Value Range   Troponin I <0.30  <0.30 ng/mL   Comment:            Due to the release kinetics of cTnI,     a negative result within the first hours     of the onset of symptoms does not rule out     myocardial infarction  with certainty.     If myocardial infarction is still suspected,     repeat the test at appropriate intervals.  POCT I-STAT, CHEM 8     Status: Abnormal   Collection Time    02/01/13  2:30 PM      Result Value Range   Sodium 141  135 - 145 mEq/L   Potassium 3.9  3.5 - 5.1 mEq/L   Chloride 105  96 - 112 mEq/L   BUN 17  6 - 23 mg/dL   Creatinine, Ser 1.61  0.50 - 1.10 mg/dL   Glucose, Bld 096 (*) 70 - 99 mg/dL   Calcium, Ion 0.45 (*) 1.13 - 1.30 mmol/L   TCO2 25  0 - 100 mmol/L   Hemoglobin 16.3 (*) 12.0 - 15.0 g/dL   HCT 40.9 (*) 81.1 - 91.4 %    Ct Head Wo Contrast  02/01/2013  *RADIOLOGY REPORT*  Clinical Data: Cerebrovascular accident  CT HEAD WITHOUT CONTRAST  Technique:  Contiguous axial images were obtained from the base of the skull through the vertex without contrast.  Comparison: 07/30/2009  Findings: No intracranial hemorrhage, mass effect or midline shift. Air fluid level is noted in the right maxillary sinus suspicious for sinusitis.  Mastoid air cells are unremarkable.  Axial image 17 there is a area of decreased attenuation in the right occipital lobe and right posterior parietal lobe. Highly suspicious for evolving ischemia. Further evaluation with brain MRI with diffusion imaging is recommended.  Stable cerebral atrophy. Stable periventricular chronic white matter disease.  No mass lesion is noted on this unenhanced scan.  IMPRESSION:  1.  Area of decreased attenuation in the right occipital / posterior right parietal lobe best seen in axial image 17 and 16. This is suspicious for evolving ischemia.  Further evaluation with brain MRI with diffusion imaging is recommended. 2.  No intracranial hemorrhage, mass effect or midline shift.  Mild cerebral atrophy.  Critical findings discussed with Dr. Hyacinth Meeker.   Original Report Authenticated By: Natasha Mead, M.D.      Assessment/Plan: 79 YO female who has suffered a right occipital posterior partial infarct visualized on CT scan.  MRI of  brain confirmed this and also showed multiple punctate infarcts bilaterally in both ACA and MCA distribution. Time of onset is unknown and last known normal was 4.15.14. Patient was on ASA daily but admits to missing a few doses. Recent A1c was 9.1 and FLP was on 4.15.14.   Stroke risk factors: 1) HTN 2) Hyperlipemia 3) DM  Plan: 1. fasting lipid panel 2. PT consult, OT consult, Speech consult 3. Echocardiogram 4. Carotid dopplers 5. Prophylactic therapy-Antiplatelet med: Aspirin - dose 325 6. Stroke swallow screen 7. Risk  Factor modification  Assessment and plan discussed with with attending physician and they are in agreement.    Felicie Morn PA-C [facility] [phone]  02/01/2013, 2:59 PM   I have seen and evaluated the patient. I have reviewed the above note and made appropriate changes. 79 yo F with dense neglect from right PCA infarct.  On MRI, there are multiple punctate infarcts suggesting cardioembolic etiology. Would pursue stroke workup. Would allow permissive hypertension if no competeing factors(e.g. Cardiac ischemia) up to 220/110.   Ritta Slot, MD [facility] [phone]  If 7pm- 7am, please page neurology on call at [phone].

## 2013-02-01 NOTE — H&P (Signed)
[facility] History and Physical  [name] HYQ:[id number] DOB: [birth year] DOA: 02/01/2013  Referring physician: Vida Roller, MD  PCP: Oliver Barre, MD   Chief Complaint: *Cerebrovascular Accident    HPI:  79 year old female with a history of hypertension, diabetes, hypercholesterolemia and coronary disease with a history of congestive heart failure who presents with altered mental status. According to the paramedics friends found the patient in her recliner in her house after not having seen her or heard from her in several days. She was found in her chair, unable to get out of the chair with left-sided weakness, altered mental status and speech difficulties. She was found to have soiled her undergarments. The patient is unable to contributory history history the occasional yes or no questions only. It is unsure when these symptoms started, she has not been seen for several days prior to being seen this morning. Her baseline is ability to care for herself completely. She complains of difficulty  to stand or walk with worsening back pain, balance problem and 3 falls in the past month, last one 4/14  with large painful bruise to left wrist. Has not been able to get her chronic pain med recently as she had no transport to come pick up the rx personally as per office policy. Has been out of most meds includiing the benicar HCT for at least 2 wks, with worsening LE edema below knees, better in the AM. Her pharmacy will deliver, but benicarHCT and crestor too expensive. Xray of her hand shows Subtle lucency mid scaphoid bone. Suspicious for non displaced fracture.Patient was brought to ED and found to have Left UE and LE weakness. CT head showed an area of decreased attenuation in the right occipital / posterior right parietal lobe this is suspicious for evolving ischemia. Neurology was consulted to see patient for worrisome infarct. Patient admits to missing her ASA over last few days.            Review of Systems: negative for the following  Constitutional: Denies fever, chills, diaphoresis, appetite change and fatigue.  HEENT: Denies photophobia, eye pain, redness, hearing loss, ear pain, congestion, sore throat, rhinorrhea, sneezing, mouth sores, trouble swallowing, neck pain, neck stiffness and tinnitus.  Respiratory: Denies SOB, DOE, cough, chest tightness, and wheezing.  Cardiovascular: Denies chest pain, palpitations and leg swelling.  Gastrointestinal: Denies nausea, vomiting, abdominal pain, diarrhea, constipation, blood in stool and abdominal distention.  Genitourinary: Denies dysuria, urgency, frequency, hematuria, flank pain and difficulty urinating.  Musculoskeletal: Denies myalgias, back pain, joint swelling, arthralgias and gait problem.  Skin: Denies pallor, rash and wound.  Neurological: Denies dizziness, seizures, syncope, weakness, light-headedness, numbness and headaches.  Hematological: Denies adenopathy. Easy bruising, personal or family bleeding history  Psychiatric/Behavioral: Denies suicidal ideation, mood changes, confusion, nervousness, sleep disturbance and agitation       Past Medical History  Diagnosis Date  . Encounter for long-term (current) use of other medications     Anitihyperlipidemic use, Long term  . DIABETES MELLITUS, TYPE I 05/30/2007  . HYPERCHOLESTEROLEMIA 10/23/2008  . HYPERTENSION 05/30/2007  . OSTEOARTHRITIS, LUMBAR SPINE 12/25/2007  . Depression 10/29/2011  . GERD (gastroesophageal reflux disease) 10/29/2011  . Diastolic CHF, chronic   . CAD (coronary artery disease)   . Anxiety problem   . Wedge compression fracture of T11-T12 vertebra   . Lumbar degenerative disc disease   . Anemia, unspecified   . Lumbar spinal stenosis   . Allergic rhinitis, cause unspecified 10/29/2011  . Osteoporosis 10/29/2011  .  Failure to thrive in childhood 10/29/2011  . Osteoarthritis of right knee 10/29/2011  . History of colon polyps  11/01/2011  . Chronic pain 11/01/2011     Past Surgical History  Procedure Laterality Date  . Abdominal hysterectomy    . Cholecystectomy    . Appendectomy    . Back surgury        Social History:  reports that she has quit smoking. She does not have any smokeless tobacco history on file. Her alcohol and drug histories are not on file.    Allergies  Allergen Reactions  . Ace Inhibitors     unknown  . Baclofen     unknown  . Cefaclor     unknown  . Chlorzoxazone     unknown  . Cloxacillin     unknown  . Codeine     unknown  . Diclofenac Sodium     unknown  . Erythromycin     unknown  . Meperidine Hcl     unknown  . Nsaids     unknown  . Promethazine     unknown  . Propoxyphene-Acetaminophen     unknown  . Sulfonamide Derivatives     unknown    Family History  Problem Relation Age of Onset  . Diabetes Neg Hx     No DM in her immdidate family     Prior to Admission medications   Medication Sig Start Date End Date Taking? Authorizing Provider  atorvastatin (LIPITOR) 10 MG tablet Take 1 tablet (10 mg total) by mouth daily. 01/29/13  Yes Corwin Levins, MD  fexofenadine (ALLEGRA) 180 MG tablet Take 1 tablet (180 mg total) by mouth daily. 01/29/13  Yes Corwin Levins, MD  fluticasone (FLONASE) 50 MCG/ACT nasal spray Place 1 spray into the nose at bedtime. In each nostril 01/29/13  Yes Corwin Levins, MD  gabapentin (NEURONTIN) 400 MG capsule Take 1 capsule (400 mg total) by mouth 3 (three) times daily. 01/29/13  Yes Corwin Levins, MD  glucose blood (ACCU-CHEK AVIVA) test strip Use as instructed. Check blood sugar four times a day    Yes Historical Provider, MD  HYDROcodone-acetaminophen (NORCO/VICODIN) 5-325 MG per tablet Take 1 tablet by mouth every 6 (six) hours as needed for pain. 01/29/13  Yes Corwin Levins, MD  insulin lispro (HUMALOG) 100 UNIT/ML injection Inject 40 unitsinto the skin 3 (three) times daily before meals. 05/18/12  Yes Romero Belling, MD  insulin NPH (HUMULIN  N,NOVOLIN N) 100 UNIT/ML injection Inject 40 Units into the skin at bedtime. 08/20/12  Yes Romero Belling, MD  Insulin Syringe-Needle U-100 (BD INSULIN SYRINGE ULTRAFINE) 31G X 5/16" 1 ML MISC Use as directed four times a day dx 250.01 04/04/11  Yes Romero Belling, MD  Insulin Syringe-Needle U-100 (INSULIN SYRINGE .5CC/30GX1/2") 30G X 1/2" 0.5 ML MISC  07/11/12  Yes Historical Provider, MD  irbesartan-hydrochlorothiazide (AVALIDE) 300-12.5 MG per tablet Take 1 tablet by mouth daily. 01/29/13  Yes Corwin Levins, MD  KLONOPIN 0.5 MG tablet TAKE (1) TABLET TWICE DAILY AS NEEDED FOR ANXIETY. 06/26/12  Yes Corwin Levins, MD  metoCLOPramide (REGLAN) 10 MG tablet Take 1 tablet (10 mg total) by mouth daily. Take 1 by mouth once daily 11/01/11  Yes Corwin Levins, MD  metoprolol succinate (TOPROL-XL) 50 MG 24 hr tablet Take 1 tablet (50 mg total) by mouth daily. Take with or immediately following a meal. 01/29/13 01/29/14 Yes Corwin Levins, MD  nortriptyline (PAMELOR) 25 MG  capsule Take 25 mg by mouth at bedtime as needed (pain).   Yes Historical Provider, MD  omeprazole (PRILOSEC) 20 MG capsule Take 1 capsule (20 mg total) by mouth daily. 01/29/13  Yes Corwin Levins, MD     Physical Exam: Filed Vitals:   02/01/13 1407 02/01/13 1430 02/01/13 1445 02/01/13 1500  BP:  181/121 191/84 210/92  Pulse:  107 106 106  Temp: 99.4 F (37.4 C)     TempSrc:      Resp:  26 25 24   SpO2:  97% 96% 96%     Constitutional: Vital signs reviewed. Patient is a well-developed and well-nourished in no acute distress and cooperative with exam. Alert and oriented x3.  Head: Normocephalic and atraumatic  Ear: TM normal bilaterally  Mouth: no erythema or exudates, MMM  Eyes: PERRL, EOMI, conjunctivae normal, No scleral icterus.  Neck: Supple, Trachea midline normal ROM, No JVD, mass, thyromegaly, or carotid bruit present.  Cardiovascular: RRR, S1 normal, S2 normal, no MRG, pulses symmetric and intact bilaterally  Pulmonary/Chest: CTAB, no  wheezes, rales, or rhonchi  Abdominal: Soft. Non-tender, non-distended, bowel sounds are normal, no masses, organomegaly, or guarding present.  GU: no CVA tenderness Musculoskeletal: No joint deformities, erythema, or stiffness, ROM full and no nontender Ext: no edema and no cyanosis, pulses palpable bilaterally (DP and PT)  Hematology: no cervical, inginal, or axillary adenopathy.  Neurological:Neurological: She is alert.  The patient is awake and alert, she is able to follow occasional commands such as grip, unable to lift either of her lower extremities off the bed but has significant weakness of the left lower and left upper extremity. Her speech is impaired, it is clear but only able to answer occasional questions with yes or no answers. Cranial nerves are abnormal with left-sided facial droop, left-sided hemi-neglect, impaired peripheral visual fields    Skin: Warm, dry and intact. No rash, cyanosis, or clubbing.  Psychiatric: Normal mood and affect. speech and behavior is normal. Judgment and thought content normal. Cognition and memory are normal.       Labs on Admission:    Basic Metabolic Panel:  Recent Labs Lab 01/29/13 1120 02/01/13 1351 02/01/13 1430  NA 137 138 141  K 3.6 4.0 3.9  CL 100 100 105  CO2 26 22  --   GLUCOSE 242* 314* 322*  BUN 9 17 17   CREATININE 0.6 0.53 0.60  CALCIUM 8.8 9.8  --    Liver Function Tests:  Recent Labs Lab 01/29/13 1120 02/01/13 1351  AST 18 41*  ALT 17 23  ALKPHOS 116 163*  BILITOT 0.6 0.7  PROT 6.6 7.8  ALBUMIN 3.5 3.6   No results found for this basename: LIPASE, AMYLASE,  in the last 168 hours No results found for this basename: AMMONIA,  in the last 168 hours CBC:  Recent Labs Lab 01/29/13 1120 02/01/13 1351 02/01/13 1430  WBC 5.8 8.1  --   NEUTROABS 3.9 6.6  --   HGB 13.8 15.7* 16.3*  HCT 40.8 45.4 48.0*  MCV 88.5 87.0  --   PLT 168.0 183  --    Cardiac Enzymes:  Recent Labs Lab 02/01/13 1355   TROPONINI <0.30    BNP (last 3 results) No results found for this basename: PROBNP,  in the last 8760 hours    CBG:  Recent Labs Lab 02/01/13 1350  GLUCAP 321*    Radiological Exams on Admission: Ct Head Wo Contrast  02/01/2013  *RADIOLOGY REPORT*  Clinical Data:  Cerebrovascular accident  CT HEAD WITHOUT CONTRAST  Technique:  Contiguous axial images were obtained from the base of the skull through the vertex without contrast.  Comparison: 07/30/2009  Findings: No intracranial hemorrhage, mass effect or midline shift. Air fluid level is noted in the right maxillary sinus suspicious for sinusitis.  Mastoid air cells are unremarkable.  Axial image 17 there is a area of decreased attenuation in the right occipital lobe and right posterior parietal lobe. Highly suspicious for evolving ischemia. Further evaluation with brain MRI with diffusion imaging is recommended.  Stable cerebral atrophy. Stable periventricular chronic white matter disease.  No mass lesion is noted on this unenhanced scan.  IMPRESSION:  1.  Area of decreased attenuation in the right occipital / posterior right parietal lobe best seen in axial image 17 and 16. This is suspicious for evolving ischemia.  Further evaluation with brain MRI with diffusion imaging is recommended. 2.  No intracranial hemorrhage, mass effect or midline shift.  Mild cerebral atrophy.  Critical findings discussed with Dr. Hyacinth Meeker.   Original Report Authenticated By: Natasha Mead, M.D.     EKG: Independently reviewed. Date: 02/01/2013  Rate: 103  Rhythm: sinus tachycardia  QRS Axis: left  Intervals: normal  ST/T Wave abnormalities: normal  Conduction Disutrbances:none  Narrative Interpretation: q waves inferior leads  Old EKG Reviewed: none available    Assessment/Plan Principal Problem:   CVA (cerebral infarction) Active Problems:   HYPERCHOLESTEROLEMIA   HYPERTENSION   Depression   Diastolic CHF, chronic   Lumbar degenerative disc disease    Failure to thrive in childhood   Diabetes mellitus, insulin dependent (IDDM), controlled      CVA   1. fasting lipid panel  2. MRI, MRA of the brain without contrast  3. PT consult, OT consult, Speech consult  4. Echocardiogram  5. Carotid dopplers  6. Prophylactic therapy-Antiplatelet med: Aspirin - dose 325  7. Risk Factor modification   Diabetes insulin dependent Start SSI , Hg A1C   Falls Probably related to the CVA   HTN continue home meds ,hopefully that will improve BP, otherwise prn hydralazine    Code Status:   full Family Communication: bedside Disposition Plan: admit   Time spent: 70 mins   [facility] Pager [phone]  If 7PM-7AM, please contact night-coverage [email or web address] Password [facility] 02/01/2013, 3:55 PM

## 2013-02-01 NOTE — ED Notes (Signed)
Per EMS pt was last seen normal 1 week ago, Pt was discovered by friends who visit her weekly. Pt was found in recliner unable to move, with left sided arm weakness, bilateral leg weakness. Pt was covered in urine. Pt has possible expressive aphasia. Vitals b/p 220/120, CBG 355.

## 2013-02-01 NOTE — ED Provider Notes (Signed)
History     CSN: [id number]  Arrival date & time 02/01/13  1249   First MD Initiated Contact with Patient 02/01/13 1255      Chief Complaint  Patient presents with  . Cerebrovascular Accident    (Consider location/radiation/quality/duration/timing/severity/associated sxs/prior treatment) HPI Comments: Level V caveat apply secondary to altered mental status  The patient is a 79 year old female with a history of hypertension, diabetes, hypercholesterolemia and coronary disease with a history of congestive heart failure who presents with altered mental status. According to the paramedics friends found the patient in her recliner in her house after not having seen her or heard from her in several days. She was found in her chair, unable to get out of the chair with left-sided weakness, altered mental status and speech difficulties. She was found to have soiled her undergarments. The patient is unable to contributory history history the occasional yes or no questions only. It is unsure when these symptoms started, she has not been seen for several days prior to being seen this morning. Her baseline is ability to care for herself completely  Patient is a 79 y.o. female presenting with Acute Neurological Problem. The history is provided by the patient and the EMS personnel.  Cerebrovascular Accident    Past Medical History  Diagnosis Date  . Encounter for long-term (current) use of other medications     Anitihyperlipidemic use, Long term  . DIABETES MELLITUS, TYPE I 05/30/2007  . HYPERCHOLESTEROLEMIA 10/23/2008  . HYPERTENSION 05/30/2007  . OSTEOARTHRITIS, LUMBAR SPINE 12/25/2007  . Depression 10/29/2011  . GERD (gastroesophageal reflux disease) 10/29/2011  . Diastolic CHF, chronic   . CAD (coronary artery disease)   . Anxiety problem   . Wedge compression fracture of T11-T12 vertebra   . Lumbar degenerative disc disease   . Anemia, unspecified   . Lumbar spinal stenosis   . Allergic  rhinitis, cause unspecified 10/29/2011  . Osteoporosis 10/29/2011  . Failure to thrive in childhood 10/29/2011  . Osteoarthritis of right knee 10/29/2011  . History of colon polyps 11/01/2011  . Chronic pain 11/01/2011    Past Surgical History  Procedure Laterality Date  . Abdominal hysterectomy    . Cholecystectomy    . Appendectomy    . Back surgury      Family History  Problem Relation Age of Onset  . Diabetes Neg Hx     No DM in her immdidate family    History  Substance Use Topics  . Smoking status: Former Games developer  . Smokeless tobacco: Not on file  . Alcohol Use:     OB History   Grav Para Term Preterm Abortions TAB SAB Ect Mult Living                  Review of Systems  Unable to perform ROS: Mental status change    Allergies  Ace inhibitors; Baclofen; Cefaclor; Chlorzoxazone; Cloxacillin; Codeine; Diclofenac sodium; Erythromycin; Meperidine hcl; Nsaids; Promethazine; Propoxyphene-acetaminophen; and Sulfonamide derivatives  Home Medications   Current Outpatient Rx  Name  Route  Sig  Dispense  Refill  . atorvastatin (LIPITOR) 10 MG tablet   Oral   Take 1 tablet (10 mg total) by mouth daily.   90 tablet   3   . EXPIRED: clonazePAM (KLONOPIN) 0.5 MG tablet   Oral   Take 1 tablet (0.5 mg total) by mouth 2 (two) times daily as needed for anxiety.   60 tablet   2   . fexofenadine (ALLEGRA) 180 MG  tablet   Oral   Take 1 tablet (180 mg total) by mouth daily.   90 tablet   3   . fluticasone (FLONASE) 50 MCG/ACT nasal spray   Nasal   Place 1 spray into the nose at bedtime. In each nostril   16 g   5   . gabapentin (NEURONTIN) 400 MG capsule   Oral   Take 1 capsule (400 mg total) by mouth 3 (three) times daily.   270 capsule   1   . glucose blood (ACCU-CHEK AVIVA) test strip      Use as instructed. Check blood sugar four times a day          . HYDROcodone-acetaminophen (NORCO/VICODIN) 5-325 MG per tablet   Oral   Take 1 tablet by mouth every 6  (six) hours as needed for pain.   120 tablet   1   . insulin lispro (HUMALOG) 100 UNIT/ML injection      Inject 40 unitsinto the skin 3 (three) times daily before meals.         . insulin NPH (HUMULIN N,NOVOLIN N) 100 UNIT/ML injection   Subcutaneous   Inject 40 Units into the skin at bedtime.         . Insulin Syringe-Needle U-100 (BD INSULIN SYRINGE ULTRAFINE) 31G X 5/16" 1 ML MISC      Use as directed four times a day dx 250.01   300 each   2   . Insulin Syringe-Needle U-100 (INSULIN SYRINGE .5CC/30GX1/2") 30G X 1/2" 0.5 ML MISC               . irbesartan-hydrochlorothiazide (AVALIDE) 300-12.5 MG per tablet   Oral   Take 1 tablet by mouth daily.   90 tablet   3   . KLONOPIN 0.5 MG tablet      TAKE (1) TABLET TWICE DAILY AS NEEDED FOR ANXIETY.   60 each   2   . metoCLOPramide (REGLAN) 10 MG tablet   Oral   Take 1 tablet (10 mg total) by mouth daily. Take 1 by mouth once daily   90 tablet   3   . metoprolol succinate (TOPROL-XL) 50 MG 24 hr tablet   Oral   Take 1 tablet (50 mg total) by mouth daily. Take with or immediately following a meal.   90 tablet   3   . nortriptyline (PAMELOR) 25 MG capsule   Oral   Take 1 capsule (25 mg total) by mouth at bedtime as needed.   90 capsule   1   . omeprazole (PRILOSEC) 20 MG capsule   Oral   Take 1 capsule (20 mg total) by mouth daily.   90 capsule   3     BP 215/86  Pulse 106  Temp(Src) 99.4 F (37.4 C) (Oral)  Resp 23  SpO2 97%  Physical Exam  Nursing note and vitals reviewed. Constitutional: She appears well-developed and well-nourished.  HENT:  Head: Normocephalic and atraumatic.  Mouth/Throat: Oropharynx is clear and moist. No oropharyngeal exudate.  Eyes: EOM are normal. Pupils are equal, round, and reactive to light. Right eye exhibits no discharge. Left eye exhibits no discharge. No scleral icterus.  Slight erythema of the right upper and lower lids, mild conjunctival injection of the  right conjunctiva, normal appearing pupils bilaterally, extraocular movements are impaired to the left  Neck: Normal range of motion. Neck supple. No JVD present. No thyromegaly present.  Cardiovascular: Normal rate, regular rhythm, normal heart sounds and  intact distal pulses.  Exam reveals no gallop and no friction rub.   No murmur heard. No carotid bruit  Pulmonary/Chest: Effort normal and breath sounds normal. No respiratory distress. She has no wheezes. She has no rales.  Abdominal: Soft. Bowel sounds are normal. She exhibits no distension and no mass. There is no tenderness.  Musculoskeletal: Normal range of motion. She exhibits edema (bilateral symmetrical lower extremity edema). She exhibits no tenderness.  Lymphadenopathy:    She has no cervical adenopathy.  Neurological: She is alert.  The patient is awake and alert, she is able to follow occasional commands such as grip, unable to lift either of her lower extremities off the bed but has significant weakness of the left lower and left upper extremity. Her speech is impaired, it is clear but only able to answer occasional questions with yes or no answers. Cranial nerves are abnormal with left-sided facial droop, left-sided hemi-neglect, impaired peripheral visual fields  Skin: Skin is warm and dry. No rash noted. No erythema.  Psychiatric: She has a normal mood and affect. Her behavior is normal.    ED Course  Procedures (including critical care time)  Labs Reviewed  CBC - Abnormal; Notable for the following:    RBC 5.22 (*)    Hemoglobin 15.7 (*)    All other components within normal limits  DIFFERENTIAL - Abnormal; Notable for the following:    Neutrophils Relative 82 (*)    Lymphocytes Relative 9 (*)    All other components within normal limits  COMPREHENSIVE METABOLIC PANEL - Abnormal; Notable for the following:    Glucose, Bld 314 (*)    AST 41 (*)    Alkaline Phosphatase 163 (*)    GFR calc non Af Amer 89 (*)    All  other components within normal limits  GLUCOSE, CAPILLARY - Abnormal; Notable for the following:    Glucose-Capillary 321 (*)    All other components within normal limits  POCT I-STAT, CHEM 8 - Abnormal; Notable for the following:    Glucose, Bld 322 (*)    Calcium, Ion 1.12 (*)    Hemoglobin 16.3 (*)    HCT 48.0 (*)    All other components within normal limits  ETHANOL  PROTIME-INR  APTT  TROPONIN I  URINE RAPID DRUG SCREEN (HOSP PERFORMED)  URINALYSIS, ROUTINE W REFLEX MICROSCOPIC   Ct Head Wo Contrast  02/01/2013  *RADIOLOGY REPORT*  Clinical Data: Cerebrovascular accident  CT HEAD WITHOUT CONTRAST  Technique:  Contiguous axial images were obtained from the base of the skull through the vertex without contrast.  Comparison: 07/30/2009  Findings: No intracranial hemorrhage, mass effect or midline shift. Air fluid level is noted in the right maxillary sinus suspicious for sinusitis.  Mastoid air cells are unremarkable.  Axial image 17 there is a area of decreased attenuation in the right occipital lobe and right posterior parietal lobe. Highly suspicious for evolving ischemia. Further evaluation with brain MRI with diffusion imaging is recommended.  Stable cerebral atrophy. Stable periventricular chronic white matter disease.  No mass lesion is noted on this unenhanced scan.  IMPRESSION:  1.  Area of decreased attenuation in the right occipital / posterior right parietal lobe best seen in axial image 17 and 16. This is suspicious for evolving ischemia.  Further evaluation with brain MRI with diffusion imaging is recommended. 2.  No intracranial hemorrhage, mass effect or midline shift.  Mild cerebral atrophy.  Critical findings discussed with Dr. Hyacinth Meeker.   Original Report  Authenticated By: Natasha Mead, M.D.      1. Stroke   2. Hypertension   3. Hyperglycemia       MDM  The patient has likely had a cerebrovascular accident, she has significant hypertensive per EMS with blood pressures  over 220 systolic. We sent to the CT scan, labs, EKG, anticipate admission for further stroke workup. There is some concern for intracranial hemorrhage, the patient is not posturing, she is able to follow occasional commands but significantly impaired.  ED ECG REPORT  I personally interpreted this EKG   Date: 02/01/2013   Rate: 103  Rhythm: sinus tachycardia  QRS Axis: left  Intervals: normal  ST/T Wave abnormalities: normal  Conduction Disutrbances:none  Narrative Interpretation: q waves inferior leads  Old EKG Reviewed: none available  Discussed with radiologist who states that CT scan shows evolving right posterior occipital infarct.  No improvement in the patient's status, labs beginning to return, no significant leukocytosis though there is a slight hemoconcentration based on hemoglobin and hematocrit. Hyperglycemia present 320. Neurology consultation requested, anticipate internal medicine admission.  D/w Dr. Amada Jupiter who will see in consultation.  Admit to IM.  Discuss with neurology after they've seen the patient, no indication to treat a blood pressure at this time as clinically on repeated measurements is 185/90. Discussed with hospitalist who will admit.  Dr. Susie Cassette.         Vida Roller, MD 02/01/13 [phone]

## 2013-02-02 DIAGNOSIS — I10 Essential (primary) hypertension: Secondary | ICD-10-CM

## 2013-02-02 DIAGNOSIS — I6350 Cerebral infarction due to unspecified occlusion or stenosis of unspecified cerebral artery: Secondary | ICD-10-CM

## 2013-02-02 DIAGNOSIS — E109 Type 1 diabetes mellitus without complications: Secondary | ICD-10-CM

## 2013-02-02 DIAGNOSIS — I509 Heart failure, unspecified: Secondary | ICD-10-CM

## 2013-02-02 DIAGNOSIS — I5032 Chronic diastolic (congestive) heart failure: Secondary | ICD-10-CM

## 2013-02-02 DIAGNOSIS — I519 Heart disease, unspecified: Secondary | ICD-10-CM

## 2013-02-02 LAB — LIPID PANEL
LDL Cholesterol: 216 mg/dL — ABNORMAL HIGH (ref 0–99)
Total CHOL/HDL Ratio: 11 RATIO
VLDL: 34 mg/dL (ref 0–40)

## 2013-02-02 LAB — GLUCOSE, CAPILLARY
Glucose-Capillary: 307 mg/dL — ABNORMAL HIGH (ref 70–99)
Glucose-Capillary: 255 mg/dL — ABNORMAL HIGH (ref 70–99)
Glucose-Capillary: 216 mg/dL — ABNORMAL HIGH (ref 70–99)

## 2013-02-02 LAB — HEMOGLOBIN A1C
Hgb A1c MFr Bld: 9.1 % — ABNORMAL HIGH (ref <5.7)
Mean Plasma Glucose: 214 mg/dL — ABNORMAL HIGH (ref <117)

## 2013-02-02 LAB — TROPONIN I: Troponin I: <0.3 ng/mL (ref <0.30)

## 2013-02-02 MED ORDER — LABETALOL HCL 5 MG/ML IV SOLN
10.0000 mg | Freq: Four times a day (QID) | INTRAVENOUS | Status: DC | PRN
  Administered 2013-02-02 – 2013-02-03 (×2): 10 mg via INTRAVENOUS
  Filled 2013-02-02 (×2): qty 4

## 2013-02-02 MED ORDER — INSULIN ASPART 100 UNIT/ML ~~LOC~~ SOLN
0.0000 [IU] | SUBCUTANEOUS | Status: DC
  Administered 2013-02-02 (×2): 5 [IU] via SUBCUTANEOUS
  Administered 2013-02-02: 7 [IU] via SUBCUTANEOUS
  Administered 2013-02-02 (×2): 3 [IU] via SUBCUTANEOUS
  Administered 2013-02-03: 2 [IU] via SUBCUTANEOUS
  Administered 2013-02-03 (×2): 3 [IU] via SUBCUTANEOUS
  Administered 2013-02-03: 5 [IU] via SUBCUTANEOUS
  Administered 2013-02-03: 3 [IU] via SUBCUTANEOUS
  Administered 2013-02-03: 2 [IU] via SUBCUTANEOUS
  Administered 2013-02-04: 3 [IU] via SUBCUTANEOUS
  Administered 2013-02-04: 13:00:00 via SUBCUTANEOUS
  Administered 2013-02-04 (×3): 3 [IU] via SUBCUTANEOUS
  Administered 2013-02-05 (×2): 2 [IU] via SUBCUTANEOUS
  Administered 2013-02-05: 1 [IU] via SUBCUTANEOUS
  Administered 2013-02-05: 2 [IU] via SUBCUTANEOUS
  Administered 2013-02-05 – 2013-02-06 (×4): 1 [IU] via SUBCUTANEOUS
  Administered 2013-02-06: 2 [IU] via SUBCUTANEOUS
  Administered 2013-02-06: 1 [IU] via SUBCUTANEOUS
  Administered 2013-02-07 (×4): 2 [IU] via SUBCUTANEOUS
  Administered 2013-02-07: 1 [IU] via SUBCUTANEOUS
  Administered 2013-02-07: 3 [IU] via SUBCUTANEOUS
  Administered 2013-02-08: 1 [IU] via SUBCUTANEOUS
  Administered 2013-02-08: 5 [IU] via SUBCUTANEOUS

## 2013-02-02 MED ORDER — INSULIN GLARGINE 100 UNIT/ML ~~LOC~~ SOLN
5.0000 [IU] | Freq: Every day | SUBCUTANEOUS | Status: DC
  Administered 2013-02-02 – 2013-02-03 (×2): 5 [IU] via SUBCUTANEOUS
  Filled 2013-02-02 (×3): qty 0.05

## 2013-02-02 NOTE — Progress Notes (Signed)
02/02/13 Patient was a transfer from [facility]00 with Dx of HTN,CVA with Rt side weakness,Alert x 2, IV site 4/18 Rt A/C NSL,VTE lovenox,Diet NPO,Lethargic, incontinent of bowels has has several stools 4/19, F/C for urine incontinence, currently on bedrest for activity, Full code, and high fall risk.

## 2013-02-02 NOTE — Progress Notes (Signed)
[facility] PROGRESS NOTE Assessment/Plan:  *CVA (cerebral infarction) ? Embolic: - Allow a degree of permisive HTN, d/chydralazine. - Cardiac markers negative. Cont ASA. - MRI as below - ECHO pending ?, no events on telemetry. - Statins. Carotid doppler pending.   Diabetes mellitus, insulin dependent (IDDM), controlled - High, low dose lantus cont SSI.   Diastolic CHF, chronic/HYPERTENSION - lower extremity edema. - cont Avapro, HCTZ, metorpolol  HYPERCHOLESTEROLEMIA - statins   Code Status: full  Family Communication: bedside  Disposition Plan: admit     Consultants:  Neuro  Procedures: MRI 4.18.2014: acute infarct in the right PCA territory with occipital lobe mild cytotoxic edema. However, there are also numerous small acute infarcts in the left ACA, left PCA, and bilateral MCA territory. MRA: Occlusion or near occlusion of the right PCA in the distal P2/proximal P3 segment    Antibiotics:  none (indicate start date, and stop date if known)  HPI/Subjective: Pt sleepy, has no complains As per nurse her mentation has decrease.  Objective: Filed Vitals:   02/02/13 0300 02/02/13 0355 02/02/13 0400 02/02/13 0600  BP: 163/68 175/71 179/75 167/66  Pulse: 89 94 95 92  Temp:  97.7 F (36.5 C)    TempSrc:  Oral    Resp: 24 26 27 23   SpO2: 95% 95% 95% 96%    Intake/Output Summary (Last 24 hours) at 02/02/13 0758 Last data filed at 02/02/13 0355  Gross per 24 hour  Intake      0 ml  Output   1275 ml  Net  -1275 ml   There were no vitals filed for this visit.  Exam:  General: Alert, awake, oriented x2, in no acute distress.  HEENT: No bruits, no goiter.  Heart: Regular rate and rhythm, without murmurs, rubs, gallops.  Lungs: Good air movement, clear to auscultation.  Abdomen: Soft, nontender, nondistended, positive bowel sounds.  Neuro: answer some question no pstosis, extra-ocular movement intact, no facial asymmetry, MS intact, normal finger to  one.   Data Reviewed: Basic Metabolic Panel:  Recent Labs Lab 01/29/13 1120 02/01/13 1351 02/01/13 1430 02/01/13 1651  NA 137 138 141  --   K 3.6 4.0 3.9  --   CL 100 100 105  --   CO2 26 22  --   --   GLUCOSE 242* 314* 322*  --   BUN 9 17 17   --   CREATININE 0.6 0.53 0.60 0.48*  CALCIUM 8.8 9.8  --   --    Liver Function Tests:  Recent Labs Lab 01/29/13 1120 02/01/13 1351  AST 18 41*  ALT 17 23  ALKPHOS 116 163*  BILITOT 0.6 0.7  PROT 6.6 7.8  ALBUMIN 3.5 3.6   No results found for this basename: LIPASE, AMYLASE,  in the last 168 hours No results found for this basename: AMMONIA,  in the last 168 hours CBC:  Recent Labs Lab 01/29/13 1120 02/01/13 1351 02/01/13 1430 02/01/13 1651  WBC 5.8 8.1  --  9.0  NEUTROABS 3.9 6.6  --   --   HGB 13.8 15.7* 16.3* 16.5*  HCT 40.8 45.4 48.0* 46.7*  MCV 88.5 87.0  --  85.5  PLT 168.0 183  --  184   Cardiac Enzymes:  Recent Labs Lab 02/01/13 1355 02/01/13 1651 02/01/13 2218 02/02/13 0420  CKTOTAL  --  2670*  --   --   CKMB  --  10.5*  --   --   TROPONINI <0.30 <0.30 <0.30 <0.30  BNP (last 3 results) No results found for this basename: PROBNP,  in the last 8760 hours CBG:  Recent Labs Lab 02/01/13 1350 02/01/13 1941 02/01/13 2329 02/02/13 0352  GLUCAP 321* 286* 368* 307*    Recent Results (from the past 240 hour(s))  MRSA PCR SCREENING     Status: None   Collection Time    02/01/13  4:56 PM      Result Value Range Status   MRSA by PCR NEGATIVE  NEGATIVE Final   Comment:            The GeneXpert MRSA Assay (FDA     approved for NASAL specimens     only), is one component of a     comprehensive MRSA colonization     surveillance program. It is not     intended to diagnose MRSA     infection nor to guide or     monitor treatment for     MRSA infections.     Studies: Dg Chest 2 View  02/01/2013  *RADIOLOGY REPORT*  Clinical Data: Stroke  CHEST - 2 VIEW  Comparison: 12/16/2010  Findings:  Chronic interstitial markings.  Mild superimposed opacity in the right upper and lower lobe is possible, possibly reflecting aspiration or pneumonia. No pleural effusion or pneumothorax.  The heart is normal in size.  Mild degenerative changes of the visualized thoracolumbar spine.  IMPRESSION: Possible mild right upper and lower lobe opacity, aspiration or pneumonia not excluded.   Original Report Authenticated By: Charline Bills, M.D.    Ct Head Wo Contrast  02/01/2013  *RADIOLOGY REPORT*  Clinical Data: Cerebrovascular accident  CT HEAD WITHOUT CONTRAST  Technique:  Contiguous axial images were obtained from the base of the skull through the vertex without contrast.  Comparison: 07/30/2009  Findings: No intracranial hemorrhage, mass effect or midline shift. Air fluid level is noted in the right maxillary sinus suspicious for sinusitis.  Mastoid air cells are unremarkable.  Axial image 17 there is a area of decreased attenuation in the right occipital lobe and right posterior parietal lobe. Highly suspicious for evolving ischemia. Further evaluation with brain MRI with diffusion imaging is recommended.  Stable cerebral atrophy. Stable periventricular chronic white matter disease.  No mass lesion is noted on this unenhanced scan.  IMPRESSION:  1.  Area of decreased attenuation in the right occipital / posterior right parietal lobe best seen in axial image 17 and 16. This is suspicious for evolving ischemia.  Further evaluation with brain MRI with diffusion imaging is recommended. 2.  No intracranial hemorrhage, mass effect or midline shift.  Mild cerebral atrophy.  Critical findings discussed with Dr. Hyacinth Meeker.   Original Report Authenticated By: Natasha Mead, M.D.    Mr Maxine Glenn Head Wo Contrast  02/01/2013  *RADIOLOGY REPORT*  Clinical Data:  79 year old female found unresponsive.  Left side weakness.  Abnormal head CT.  Comparison: Head CT without contrast 02/01/2013.  MRI HEAD WITHOUT CONTRAST  Technique:  Multiplanar, multiecho pulse sequences of the brain and surrounding structures were obtained according to standard protocol without intravenous contrast.  Findings: There is confluent restricted diffusion in the right occipital lobe PCA territory.  However, there are also multiple sub centimeter punctate foci of restricted diffusion in the bilateral MCA, left ACA, and bilateral PCA territories (see series 11). Diffusion is mildly heterogeneous in the posterior fossa, but no definite posterior fossa restricted diffusion is identified.  Major intracranial vascular flow voids are preserved.  Mild T2 and FLAIR hyperintensity in  the right occipital lobe. Minimal to mild T2 and FLAIR hyperintensity at the other acutely affected areas.  Underlying chronic small infarct in the right superior cerebellum.  Mild to moderate for age bilateral deep gray matter nuclei T2 heterogeneity, not all related to the acute findings. No acute intracranial hemorrhage identified.  No midline shift, mass effect, or evidence of mass lesion.  Negative pituitary, cervicomedullary junction and visualized cervical spine. No ventriculomegaly.  Normal bone marrow signal.  Small fluid level in the right maxillary sinus.  Other Visualized paranasal sinuses and mastoids are clear.  Visualized orbit soft tissues are within normal limits. Negative scalp soft tissues.  IMPRESSION: 1.  Confluent acute infarct in the right PCA territory with occipital lobe mild cytotoxic edema.  However, there are also numerous small acute infarcts in the left ACA, left PCA, and bilateral MCA territory.  Constellation of findings is therefore most suggestive of acute embolic event. 2.  No mass effect or hemorrhage. 3.  See MRA findings below.  MRA HEAD WITHOUT CONTRAST  Technique: Angiographic images of the Circle of Willis were obtained using MRA technique without  intravenous contrast.  Findings: Diminutive distal left vertebral artery with absent antegrade flow signal in  the distal neck.  There is some intracranial flow signal, and the left PICA appears patent. Dominant appearing distal right vertebral artery is antegrade flow. Patent right PICA origin.  Right vertebral artery supplies the basilar.  Bilateral AICA origins patent.  The basilar artery is irregular, but in part this might reflect MOTSA artifact.  At most, mild mid basilar artery stenosis.  SCA origins are within normal limits.  The right PCA origin is patent and within normal limits.  Right posterior communicating artery is present.  The left PCA is a fetal type.  There is occlusion or near occlusion of the right PCA in the distal P2/proximal P3 segment (series 503 image 12).  Distal left PCA branches appear patent.  Antegrade flow in both ICA siphons.  Mild ICA irregularity.  No ICA siphon stenosis.  Ophthalmic and both posterior communicating artery origins are within normal limits.  Carotid termini are patent.  MCA and ACA origins are within normal limits.  The anterior communicating arteries diminutive. Visualized bilateral ACA branches are within normal limits. Visualized bilateral MCA branches are within normal limits; no major MCA branch occlusion identified.  IMPRESSION: 1.  Occlusion or near occlusion of the right PCA in the distal P2/proximal P3 segment. 2. Nondominant distal left vertebral artery, might be occluded in the neck and reconstituted from the right vertebral artery in the posterior fossa.  Still, the absence of acute MRI changes in the posterior fossa suggest this is not of acute clinical significance. 3.  Intracranial atherosclerosis but no other major circle Willis branch occlusion or hemodynamically significant intracranial stenosis.  Study discussed by telephone with Dr. Susie Cassette on 02/01/2013 at 1637 hours.   Original Report Authenticated By: Erskine Speed, M.D.    Mr Brain Wo Contrast  02/01/2013  *RADIOLOGY REPORT*  Clinical Data:  79 year old female found unresponsive.  Left side weakness.   Abnormal head CT.  Comparison: Head CT without contrast 02/01/2013.  MRI HEAD WITHOUT CONTRAST  Technique: Multiplanar, multiecho pulse sequences of the brain and surrounding structures were obtained according to standard protocol without intravenous contrast.  Findings: There is confluent restricted diffusion in the right occipital lobe PCA territory.  However, there are also multiple sub centimeter punctate foci of restricted diffusion in the bilateral MCA, left ACA, and  bilateral PCA territories (see series 11). Diffusion is mildly heterogeneous in the posterior fossa, but no definite posterior fossa restricted diffusion is identified.  Major intracranial vascular flow voids are preserved.  Mild T2 and FLAIR hyperintensity in the right occipital lobe. Minimal to mild T2 and FLAIR hyperintensity at the other acutely affected areas.  Underlying chronic small infarct in the right superior cerebellum.  Mild to moderate for age bilateral deep gray matter nuclei T2 heterogeneity, not all related to the acute findings. No acute intracranial hemorrhage identified.  No midline shift, mass effect, or evidence of mass lesion.  Negative pituitary, cervicomedullary junction and visualized cervical spine. No ventriculomegaly.  Normal bone marrow signal.  Small fluid level in the right maxillary sinus.  Other Visualized paranasal sinuses and mastoids are clear.  Visualized orbit soft tissues are within normal limits. Negative scalp soft tissues.  IMPRESSION: 1.  Confluent acute infarct in the right PCA territory with occipital lobe mild cytotoxic edema.  However, there are also numerous small acute infarcts in the left ACA, left PCA, and bilateral MCA territory.  Constellation of findings is therefore most suggestive of acute embolic event. 2.  No mass effect or hemorrhage. 3.  See MRA findings below.  MRA HEAD WITHOUT CONTRAST  Technique: Angiographic images of the Circle of Willis were obtained using MRA technique without   intravenous contrast.  Findings: Diminutive distal left vertebral artery with absent antegrade flow signal in the distal neck.  There is some intracranial flow signal, and the left PICA appears patent. Dominant appearing distal right vertebral artery is antegrade flow. Patent right PICA origin.  Right vertebral artery supplies the basilar.  Bilateral AICA origins patent.  The basilar artery is irregular, but in part this might reflect MOTSA artifact.  At most, mild mid basilar artery stenosis.  SCA origins are within normal limits.  The right PCA origin is patent and within normal limits.  Right posterior communicating artery is present.  The left PCA is a fetal type.  There is occlusion or near occlusion of the right PCA in the distal P2/proximal P3 segment (series 503 image 12).  Distal left PCA branches appear patent.  Antegrade flow in both ICA siphons.  Mild ICA irregularity.  No ICA siphon stenosis.  Ophthalmic and both posterior communicating artery origins are within normal limits.  Carotid termini are patent.  MCA and ACA origins are within normal limits.  The anterior communicating arteries diminutive. Visualized bilateral ACA branches are within normal limits. Visualized bilateral MCA branches are within normal limits; no major MCA branch occlusion identified.  IMPRESSION: 1.  Occlusion or near occlusion of the right PCA in the distal P2/proximal P3 segment. 2. Nondominant distal left vertebral artery, might be occluded in the neck and reconstituted from the right vertebral artery in the posterior fossa.  Still, the absence of acute MRI changes in the posterior fossa suggest this is not of acute clinical significance. 3.  Intracranial atherosclerosis but no other major circle Willis branch occlusion or hemodynamically significant intracranial stenosis.  Study discussed by telephone with Dr. Susie Cassette on 02/01/2013 at 1637 hours.   Original Report Authenticated By: Erskine Speed, M.D.     Scheduled Meds: .  aspirin  300 mg Rectal Daily   Or  . aspirin  325 mg Oral Daily  . atorvastatin  10 mg Oral Daily  . enoxaparin (LOVENOX) injection  40 mg Subcutaneous Q24H  . fluticasone  1 spray Each Nare QHS  . irbesartan  300 mg  Oral Daily   And  . hydrochlorothiazide  12.5 mg Oral Daily  . insulin aspart  0-9 Units Subcutaneous Q4H  . metoCLOPramide  10 mg Oral Daily  . metoprolol succinate  50 mg Oral Daily   Continuous Infusions:    Marinda Elk  [facility] Pager [phone]. If 8PM-8AM, please contact night-coverage at [email or web address], password [facility] 02/02/2013, 7:58 AM  LOS: 1 day

## 2013-02-02 NOTE — Evaluation (Addendum)
Speech Language Pathology Evaluation Patient Details Name: [name] MRN: [id number] DOB: 1934-03-22 Today's Date: 02/02/2013 Time: 1130-1150 SLP Time Calculation (min): 20 min  Problem List:  Patient Active Problem List  Diagnosis  . HYPERCHOLESTEROLEMIA  . HYPERTENSION  . OSTEOARTHRITIS, LUMBAR SPINE  . Encounter for long-term (current) use of other medications  . Preventative health care  . Depression  . GERD (gastroesophageal reflux disease)  . Diastolic CHF, chronic  . CAD (coronary artery disease)  . Anxiety problem  . Wedge compression fracture of T11-T12 vertebra  . Lumbar degenerative disc disease  . Anemia, unspecified  . Lumbar spinal stenosis  . Allergic rhinitis, cause unspecified  . Osteoporosis  . Failure to thrive in childhood  . Osteoarthritis of right knee  . History of colon polyps  . Fatigue  . Chronic pain  . Disturbance of skin sensation  . Tremor  . Diabetes mellitus, insulin dependent (IDDM), controlled  . Recurrent falls  . Wrist pain, acute  . CVA (cerebral infarction)   Past Medical History:  Past Medical History  Diagnosis Date  . Encounter for long-term (current) use of other medications     Anitihyperlipidemic use, Long term  . DIABETES MELLITUS, TYPE I 05/30/2007  . HYPERCHOLESTEROLEMIA 10/23/2008  . HYPERTENSION 05/30/2007  . OSTEOARTHRITIS, LUMBAR SPINE 12/25/2007  . Depression 10/29/2011  . GERD (gastroesophageal reflux disease) 10/29/2011  . Diastolic CHF, chronic   . CAD (coronary artery disease)   . Anxiety problem   . Wedge compression fracture of T11-T12 vertebra   . Lumbar degenerative disc disease   . Anemia, unspecified   . Lumbar spinal stenosis   . Allergic rhinitis, cause unspecified 10/29/2011  . Osteoporosis 10/29/2011  . Failure to thrive in childhood 10/29/2011  . Osteoarthritis of right knee 10/29/2011  . History of colon polyps 11/01/2011  . Chronic pain 11/01/2011   Past Surgical History:  Past Surgical  History  Procedure Laterality Date  . Abdominal hysterectomy    . Cholecystectomy    . Appendectomy    . Back surgury     HPI:  79 year old female with a history of hypertension, diabetes, hypercholesterolemia and coronary disease with a history of congestive heart failure who presents with altered mental status. According to the paramedics friends found the patient in her recliner in her house after not having seen her or heard from her in several days. She was found in her chair, unable to get out of the chair with left-sided weakness, altered mental status and speech difficulties. She was found to have soiled her undergarments. The patient is unable to contributory history history the occasional yes or no questions only. It is unsure when these symptoms started, she has not been seen for several days prior to being seen this morning. Her baseline is ability to care for herself completely.  MRI: Confluent acute infarct in the right PCA territory with occipital lobe mild cytotoxic edema.  Also numerous small acute infarcts in the left ACA, left PCA, and bilateral MCA territory.   Cognitive Linguistic Evaluation completed per Stroke Protocol.    Assessment / Plan / Recommendation Clinical Impression  Cognitive Linguistic Evaluation completed but limited secondary to fluctuating LOA.  Deficits noted in areas of temporal orientation, initiation, intellectual awarness of current physical deficits, basic problem solving, and maintaining sustained attention.  Skilled ST indicated in acute care setting to improve cognitive skills to increase safety in current setting.      SLP Assessment  Patient needs continued Speech Lanaguage  Pathology Services    Follow Up Recommendations  SNF    Frequency and Duration min 2x/week  2 weeks      SLP Goals  SLP Goals Potential to Achieve Goals: Good Potential Considerations: Family/community support Progress/Goals/Alternative treatment plan discussed with  pt/caregiver and they: No caregivers available;Patient unable to parrticipate in goal setting SLP Goal #1: Maintain LOA and sustained attention for 15 minutes to complete target  tasks with combination of moderate  verbal, tactile, and visual cues SLP Goal #2: Answer basic orientation and personal information questions with moderate verbal cues to refer to external memory aids SLP Goal #3: Demonstrate intellectual awareness of current physical impairments by correctly stating deficits with moderate verbal cues  SLP Goal #4: Correctly state 3 reasons to use call light and reason to ask for assistance with moderate verbal cues.   SLP Evaluation Prior Functioning  Cognitive/Linguistic Baseline: Information not available Type of Home: Apartment Lives With: Alone Education: unknown  Vocation: Retired   IT consultant  Overall Cognitive Status: Impaired/Different from baseline Arousal/Alertness: Lethargic Orientation Level: Oriented to person Attention: Focused Focused Attention: Impaired Focused Attention Impairment: Verbal basic Memory: Impaired Memory Impairment: Decreased short term memory;Decreased long term memory Decreased Long Term Memory: Verbal basic Decreased Short Term Memory: Verbal basic Awareness: Impaired Awareness Impairment: Intellectual impairment;Emergent impairment;Anticipatory impairment Problem Solving: Impaired Problem Solving Impairment: Verbal basic;Functional basic Executive Function: Reasoning;Sequencing;Organizing;Decision Making;Initiating Reasoning: Impaired Reasoning Impairment: Verbal basic Sequencing: Impaired Sequencing Impairment: Verbal basic Organizing: Impaired Organizing Impairment: Verbal basic Decision Making: Impaired Decision Making Impairment: Verbal basic Initiating: Impaired Initiating Impairment: Verbal basic Safety/Judgment: Impaired    Comprehension  Auditory Comprehension Overall Auditory Comprehension: Impaired Interfering  Components: Attention;Processing speed EffectiveTechniques: Extra processing time Visual Recognition/Discrimination Discrimination: Not tested Reading Comprehension Reading Status: Not tested    Expression Expression Primary Mode of Expression: Verbal Verbal Expression Overall Verbal Expression: Impaired Initiation: Impaired Level of Generative/Spontaneous Verbalization: Sentence Repetition: Impaired Level of Impairment: Word level Interfering Components: Attention Written Expression Written Expression: Not tested   Oral / Motor Oral Motor/Sensory Function Overall Oral Motor/Sensory Function: Appears within functional limits for tasks assessed Motor Speech Overall Motor Speech: Appears within functional limits for tasks assessed   GO    Moreen Fowler MS, CCC-SLP [phone] [facility] 02/02/2013, 12:24 PM

## 2013-02-02 NOTE — Evaluation (Signed)
Physical Therapy Evaluation Patient Details Name: [name] MRN: [id number] DOB: 01-22-1934 Today's Date: 02/02/2013 Time: 4098-1191 PT Time Calculation (min): 26 min  PT Assessment / Plan / Recommendation Clinical Impression  Patient is a 79 y/o female admitted with CVA acute infarct in the right PCA territory with mild occipital lobe cytotoxic edema and numerous small acute infarcts in the left ACA, left PCA, and bilateral MCA territories.  She presents with decreased mobility due to decreased cognition, decreased strength left >right, poor spacial awarenss with right gaze preference and decreased left side awareness, decreased balance and limited activity tolerance.  She will benefit from skilled PT in the acute setting to maximize mobility and decrease burden of care at next venue.  Recommend SNF level rehab.    PT Assessment  Patient needs continued PT services    Follow Up Recommendations  SNF    Does the patient have the potential to tolerate intense rehabilitation    No  Barriers to Discharge  Decreased caregiver support      Equipment Recommendations  None recommended by PT    Recommendations for Other Services   None  Frequency Min 3X/week    Precautions / Restrictions Precautions Precautions: Fall   Pertinent Vitals/Pain No pain complaints      Mobility  Bed Mobility Bed Mobility: Rolling Left;Left Sidelying to Sit;Sitting - Scoot to Edge of Bed Rolling Left: 1: +2 Total assist Rolling Left: Patient Percentage: 40% Left Sidelying to Sit: With rails;HOB flat;1: +2 Total assist Left Sidelying to Sit: Patient Percentage: 20% Sitting - Scoot to Edge of Bed: 2: Max assist Details for Bed Mobility Assistance: increased time given to increase participation, max cues and sequencing one activity at a time Transfers Transfers: Sit to Stand;Stand Pivot Transfers;Stand to Sit Sit to Stand: 1: +2 Total assist;From bed Sit to Stand: Patient Percentage: 40% Stand to  Sit: To chair/3-in-1;1: +2 Total assist Stand to Sit: Patient Percentage: 20% Stand Pivot Transfers: 1: +2 Total assist Stand Pivot Transfers: Patient Percentage: 30% Details for Transfer Assistance: able to step forward with right foot to turn to sit in chair, but then needed lowering assist due to left LE buckling Ambulation/Gait Ambulation/Gait Assistance: Not tested (comment)        PT Diagnosis: Hemiplegia non-dominant side;Generalized weakness  PT Problem List: Decreased strength;Decreased cognition;Decreased knowledge of use of DME;Decreased activity tolerance;Decreased balance;Decreased mobility PT Treatment Interventions: Balance training;Gait training;Neuromuscular re-education;Cognitive remediation;Functional mobility training;Patient/family education;Therapeutic activities;Therapeutic exercise;DME instruction   PT Goals Acute Rehab PT Goals PT Goal Formulation: With patient Time For Goal Achievement: 02/16/13 Potential to Achieve Goals: Fair Pt will Roll Supine to Left Side: with min assist;with rail PT Goal: Rolling Supine to Left Side - Progress: Goal set today Pt will go Supine/Side to Sit: with mod assist;with rail PT Goal: Supine/Side to Sit - Progress: Goal set today Pt will Sit at Edge of Bed: with modified independence;6-10 min;Other (comment) (during functional activity) PT Goal: Sit at [facility] Of Bed - Progress: Goal set today Pt will go Sit to Supine/Side: with mod assist PT Goal: Sit to Supine/Side - Progress: Goal set today Pt will go Sit to Stand: with mod assist PT Goal: Sit to Stand - Progress: Goal set today Pt will go Stand to Sit: with mod assist PT Goal: Stand to Sit - Progress: Goal set today Pt will Transfer Bed to Chair/Chair to Bed: with mod assist PT Transfer Goal: Bed to Chair/Chair to Bed - Progress: Goal set today Pt will Ambulate:  1 - 15 feet;with least restrictive assistive device;with +2 total assist PT Goal: Ambulate - Progress: Goal set  today  Visit Information  Last PT Received On: 02/02/13 Assistance Needed: +2    Subjective Data  Subjective: I don't know. Patient Stated Goal: None stated   Prior Functioning  Home Living Lives With: Alone Type of Home: Apartment Prior Function Vocation: Retired    IT consultant  Cognition Arousal/Alertness: Lethargic Behavior During Therapy: Flat affect Overall Cognitive Status: Impaired/Different from baseline Area of Impairment: Orientation;Attention;Following commands;Awareness Orientation Level: Disoriented to;Place;Situation;Person;Time (stated at rehab, stated DOB [birth year]) Current Attention Level: Focused Following Commands: Follows one step commands inconsistently;Follows one step commands with increased time Awareness: Intellectual General Comments: increased time to respond to all questions, decreased awarenss of deficits    Extremity/Trunk Assessment Right Upper Extremity Assessment RUE ROM/Strength/Tone: Deficits RUE ROM/Strength/Tone Deficits: AAROM shoulder flex 120, strength flexion 3/5, elbow flexion 4/5, slight increased tone Left Upper Extremity Assessment LUE ROM/Strength/Tone: Deficits LUE ROM/Strength/Tone Deficits: AAROM grossly WFL, shoulder flexion to 100, strength shoulder flexion 3-/5, elbow flexion 4-/5, cast on left wrist, slight increased tone Right Lower Extremity Assessment RLE ROM/Strength/Tone: Deficits RLE ROM/Strength/Tone Deficits: AAROM grossly WFL, ankle dorsiflexion strength 3-/5, knee extension 4-/5, hip flexion 3-/5 Left Lower Extremity Assessment LLE ROM/Strength/Tone: Deficits LLE ROM/Strength/Tone Deficits: AAROM grossly WFL, strength ankle dorsiflexion 3-/5, knee extension 3+/5, hip flexion 2+/5   Balance Balance Balance Assessed: Yes Static Sitting Balance Static Sitting - Balance Support: Feet supported;Right upper extremity supported Static Sitting - Level of Assistance: 4: Min assist;5: Stand by assistance Static Sitting -  Comment/# of Minutes: sat approx 2-3 mintues to look at vision, noted right gaze preference able to track to midline, difficulty and increased time to find objects in center, unable to find on left side.  Trunk and head are right rotated, with left side elongated  End of Session PT - End of Session Equipment Utilized During Treatment: Gait belt Activity Tolerance: Patient tolerated treatment well Patient left: in chair Nurse Communication: Mobility status  GP     [facility] 02/02/2013, 12:41 PM Sheran Lawless, PT [phone] 02/02/2013

## 2013-02-02 NOTE — Progress Notes (Signed)
Subjective: No acute changes overnight.   Exam: Filed Vitals:   02/02/13 1046  BP: 179/74  Pulse: 94  Temp:   Resp:    Gen: In bed, NAD MS: Awake  [id number], right gaze preference, will follow across midline with smooth pursuit. Left hemianopia,  Motor: 5/5 throughout right side, she has at least 4/5 strength in left arm and leg, but does not comply well with testing 2/2 neglect.  Sensory: identifies touches to the left as being on the right, but correctly identifies body part.    Impression: 79 yo F with embolic event with symptoms predominantly from her right PCA stroke. She has a severe neglect. This was likely cardioembolic and if her TTE does not reveal source, would plan to pursue workup with TEE.   Recommendations: 1. LDL 217- needs treatment of cholesterol, will defer to IM. Goal LDL < 100.  2. A1C - pending.  3. Frequent neuro checks 4. Echocardiogram 5. Carotid dopplers - no carotid stenosis, atypical waveform in vertebral on left.  6. Prophylactic therapy-Antiplatelet med: Aspirin - dose 325mg  7. Telemetry monitoring 8. PT consult, OT consult, Speech consult 9. Could start conservatively lowering blood pressure, but would avoid precipitous drops.    Ritta Slot, MD [facility] [phone]  If 7pm- 7am, please page neurology on call at [phone].

## 2013-02-02 NOTE — Progress Notes (Signed)
Patient being transferred to Unit 5500 per MD order. Report called to Zella Ball, RN. Patient will transfer in bed.

## 2013-02-02 NOTE — Progress Notes (Signed)
*  PRELIMINARY RESULTS* Vascular Ultrasound Carotid Duplex (Doppler) has been completed.   There is no obvious evidence of hemodynamically significant internal carotid artery stenosis >40% bilaterally. Vertebral arteries are patent with antegrade flow. The left vertebral artery demonstrates an atypical waveform. The left subclavian artery is patent with antegrade flow.  02/02/2013 11:09 AM Gertie Fey, RDMS, RDCS

## 2013-02-02 NOTE — Progress Notes (Signed)
  Echocardiogram 2D Echocardiogram has been performed.  Bill Mcvey 02/02/2013, 9:26 AM

## 2013-02-03 LAB — GLUCOSE, CAPILLARY
Glucose-Capillary: 193 mg/dL — ABNORMAL HIGH (ref 70–99)
Glucose-Capillary: 228 mg/dL — ABNORMAL HIGH (ref 70–99)
Glucose-Capillary: 252 mg/dL — ABNORMAL HIGH (ref 70–99)

## 2013-02-03 LAB — HEMOGLOBIN A1C: Mean Plasma Glucose: 206 mg/dL — ABNORMAL HIGH (ref <117)

## 2013-02-03 MED ORDER — ATORVASTATIN CALCIUM 40 MG PO TABS
40.0000 mg | ORAL_TABLET | Freq: Every day | ORAL | Status: DC
  Administered 2013-02-03 – 2013-02-08 (×6): 40 mg via ORAL
  Filled 2013-02-03 (×7): qty 1

## 2013-02-03 MED ORDER — IRBESARTAN 300 MG PO TABS
300.0000 mg | ORAL_TABLET | Freq: Every day | ORAL | Status: DC
  Administered 2013-02-03 – 2013-02-08 (×6): 300 mg via ORAL
  Filled 2013-02-03 (×6): qty 1

## 2013-02-03 MED ORDER — HYDROCHLOROTHIAZIDE 12.5 MG PO CAPS
25.0000 mg | ORAL_CAPSULE | Freq: Every day | ORAL | Status: DC
  Administered 2013-02-03 – 2013-02-08 (×5): 25 mg via ORAL
  Filled 2013-02-03 (×6): qty 2

## 2013-02-03 MED ORDER — AMLODIPINE BESYLATE 5 MG PO TABS
5.0000 mg | ORAL_TABLET | Freq: Every day | ORAL | Status: DC
  Administered 2013-02-03 – 2013-02-04 (×2): 5 mg via ORAL
  Filled 2013-02-03 (×2): qty 1

## 2013-02-03 MED ORDER — SODIUM CHLORIDE 0.9 % IV SOLN: INTRAVENOUS | Status: DC

## 2013-02-03 MED ORDER — SODIUM CHLORIDE 0.9 % IV SOLN
INTRAVENOUS | Status: DC
  Administered 2013-02-04 – 2013-02-06 (×4): via INTRAVENOUS
  Administered 2013-02-06: 500 mL via INTRAVENOUS
  Administered 2013-02-07 – 2013-02-08 (×2): via INTRAVENOUS

## 2013-02-03 NOTE — Progress Notes (Signed)
Subjective: No changes, no events overnight.   Exam: Filed Vitals:   02/03/13 1135  BP: 186/110  Pulse: 93  Temp:   Resp:    Gen: In bed, NAD MS: Awake, Alert, oriented to hospital, but not . Severe left neglect.  RU:EAVWU, right gaze preference, but will track with smooth pursuit across midline.  Motor: 5/5 on right, at least 4+/5 on left, though still difficult to test due to neglect.  Sensory:continues to identify left sided stimuli as occuring on right.   Labs reviewed in epic.   Impression: 79 yo F with embolic event with symptoms predominantly from her right PCA stroke. She has a severe neglect. TTE without cardiac source, no arrhythmia on telemetry.    Recommendations: 1. LDL 217- started on atorvastatin 2. A1C - 9.1  3. Frequent neuro checks  4. Transesophageal Echocardiogram - will attempt to arrange for tomorrow, NPO after midnight.  5. Carotid dopplers - no carotid stenosis, atypical waveform in vertebral on left.  6. Prophylactic therapy-Antiplatelet med: Aspirin - dose 325mg   7. Telemetry monitoring  8. PT,  OT, Speech     Ritta Slot, MD [facility] [phone]  If 7pm- 7am, please page neurology on call at [phone].

## 2013-02-03 NOTE — Progress Notes (Signed)
[facility] PROGRESS NOTE Assessment/Plan:  *CVA (cerebral infarction) ? Embolic: - Allow a degree of permisive HTN, Start Norvasc. - Cardiac markers negative. Cont ASA. - MRI as below - ECHO below - Statins. Carotid doppler below. PT/OT pending.   Diabetes mellitus, insulin dependent (IDDM), controlled - High, low dose lantus cont SSI. - HbgA1c. > 9.0 last time.   Diastolic CHF, chronic/HYPERTENSION - lower extremity edema. - cont Avapro, HCTZ, metorpolol  HYPERCHOLESTEROLEMIA - statins   Code Status: full  Family Communication: bedside  Disposition Plan: admit     Consultants:  Neuro  Procedures: MRI 4.18.2014: acute infarct in the right PCA territory with occipital lobe mild cytotoxic edema. However, there are also numerous small acute infarcts in the left ACA, left PCA, and bilateral MCA territory. MRA: Occlusion or near occlusion of the right PCA in the distal P2/proximal P3 segment Carotid doppler 4.19.2014: no obvious evidence of hemodynamically significant internal carotid artery stenosis >40% bilaterally. Vertebral arteries are patent with antegrade flow Echo 4.19.2014: estimated ejection fraction was in the range of 60% to 65   Antibiotics:  none (indicate start date, and stop date if known)  HPI/Subjective:  has no complains   Objective: Filed Vitals:   02/02/13 1630 02/02/13 2000 02/03/13 0000 02/03/13 0400  BP: 182/75 189/95 203/98 199/110  Pulse: 93 95 89 81  Temp: 99 F (37.2 C) 99.1 F (37.3 C) 98.2 F (36.8 C) 97.8 F (36.6 C)  TempSrc:  Oral Oral Oral  Resp: 20 22 20 20   Height:      Weight:      SpO2: 94% 97% 95% 93%    Intake/Output Summary (Last 24 hours) at 02/03/13 0854 Last data filed at 02/03/13 0436  Gross per 24 hour  Intake      0 ml  Output    776 ml  Net   -776 ml   Filed Weights   02/02/13 1400  Weight: 74.7 kg (164 lb 10.9 oz)    Exam:  General: Alert, awake, oriented x2, in no acute distress.   HEENT: No bruits, no goiter.  Heart: Regular rate and rhythm, without murmurs, rubs, gallops.  Lungs: Good air movement, clear to auscultation.  Abdomen: Soft, nontender, nondistended, positive bowel sounds.  Neuro: answer some question no pstosis, extra-ocular movement intact, no facial asymmetry, MS intact, normal finger to one.   Data Reviewed: Basic Metabolic Panel:  Recent Labs Lab 01/29/13 1120 02/01/13 1351 02/01/13 1430 02/01/13 1651  NA 137 138 141  --   K 3.6 4.0 3.9  --   CL 100 100 105  --   CO2 26 22  --   --   GLUCOSE 242* 314* 322*  --   BUN 9 17 17   --   CREATININE 0.6 0.53 0.60 0.48*  CALCIUM 8.8 9.8  --   --    Liver Function Tests:  Recent Labs Lab 01/29/13 1120 02/01/13 1351  AST 18 41*  ALT 17 23  ALKPHOS 116 163*  BILITOT 0.6 0.7  PROT 6.6 7.8  ALBUMIN 3.5 3.6   No results found for this basename: LIPASE, AMYLASE,  in the last 168 hours No results found for this basename: AMMONIA,  in the last 168 hours CBC:  Recent Labs Lab 01/29/13 1120 02/01/13 1351 02/01/13 1430 02/01/13 1651  WBC 5.8 8.1  --  9.0  NEUTROABS 3.9 6.6  --   --   HGB 13.8 15.7* 16.3* 16.5*  HCT 40.8 45.4 48.0* 46.7*  MCV  88.5 87.0  --  85.5  PLT 168.0 183  --  184   Cardiac Enzymes:  Recent Labs Lab 02/01/13 1355 02/01/13 1651 02/01/13 2218 02/02/13 0420  CKTOTAL  --  2670*  --   --   CKMB  --  10.5*  --   --   TROPONINI <0.30 <0.30 <0.30 <0.30   BNP (last 3 results) No results found for this basename: PROBNP,  in the last 8760 hours CBG:  Recent Labs Lab 02/02/13 1726 02/02/13 2005 02/03/13 0015 02/03/13 0426 02/03/13 0817  GLUCAP 222* 216* 228* 250* 191*    Recent Results (from the past 240 hour(s))  MRSA PCR SCREENING     Status: None   Collection Time    02/01/13  4:56 PM      Result Value Range Status   MRSA by PCR NEGATIVE  NEGATIVE Final   Comment:            The GeneXpert MRSA Assay (FDA     approved for NASAL specimens      only), is one component of a     comprehensive MRSA colonization     surveillance program. It is not     intended to diagnose MRSA     infection nor to guide or     monitor treatment for     MRSA infections.     Studies: Dg Chest 2 View  02/01/2013  *RADIOLOGY REPORT*  Clinical Data: Stroke  CHEST - 2 VIEW  Comparison: 12/16/2010  Findings: Chronic interstitial markings.  Mild superimposed opacity in the right upper and lower lobe is possible, possibly reflecting aspiration or pneumonia. No pleural effusion or pneumothorax.  The heart is normal in size.  Mild degenerative changes of the visualized thoracolumbar spine.  IMPRESSION: Possible mild right upper and lower lobe opacity, aspiration or pneumonia not excluded.   Original Report Authenticated By: Charline Bills, M.D.    Ct Head Wo Contrast  02/01/2013  *RADIOLOGY REPORT*  Clinical Data: Cerebrovascular accident  CT HEAD WITHOUT CONTRAST  Technique:  Contiguous axial images were obtained from the base of the skull through the vertex without contrast.  Comparison: 07/30/2009  Findings: No intracranial hemorrhage, mass effect or midline shift. Air fluid level is noted in the right maxillary sinus suspicious for sinusitis.  Mastoid air cells are unremarkable.  Axial image 17 there is a area of decreased attenuation in the right occipital lobe and right posterior parietal lobe. Highly suspicious for evolving ischemia. Further evaluation with brain MRI with diffusion imaging is recommended.  Stable cerebral atrophy. Stable periventricular chronic white matter disease.  No mass lesion is noted on this unenhanced scan.  IMPRESSION:  1.  Area of decreased attenuation in the right occipital / posterior right parietal lobe best seen in axial image 17 and 16. This is suspicious for evolving ischemia.  Further evaluation with brain MRI with diffusion imaging is recommended. 2.  No intracranial hemorrhage, mass effect or midline shift.  Mild cerebral  atrophy.  Critical findings discussed with Dr. Hyacinth Meeker.   Original Report Authenticated By: Natasha Mead, M.D.    Mr Maxine Glenn Head Wo Contrast  02/01/2013  *RADIOLOGY REPORT*  Clinical Data:  79 year old female found unresponsive.  Left side weakness.  Abnormal head CT.  Comparison: Head CT without contrast 02/01/2013.  MRI HEAD WITHOUT CONTRAST  Technique: Multiplanar, multiecho pulse sequences of the brain and surrounding structures were obtained according to standard protocol without intravenous contrast.  Findings: There is confluent restricted diffusion  in the right occipital lobe PCA territory.  However, there are also multiple sub centimeter punctate foci of restricted diffusion in the bilateral MCA, left ACA, and bilateral PCA territories (see series 11). Diffusion is mildly heterogeneous in the posterior fossa, but no definite posterior fossa restricted diffusion is identified.  Major intracranial vascular flow voids are preserved.  Mild T2 and FLAIR hyperintensity in the right occipital lobe. Minimal to mild T2 and FLAIR hyperintensity at the other acutely affected areas.  Underlying chronic small infarct in the right superior cerebellum.  Mild to moderate for age bilateral deep gray matter nuclei T2 heterogeneity, not all related to the acute findings. No acute intracranial hemorrhage identified.  No midline shift, mass effect, or evidence of mass lesion.  Negative pituitary, cervicomedullary junction and visualized cervical spine. No ventriculomegaly.  Normal bone marrow signal.  Small fluid level in the right maxillary sinus.  Other Visualized paranasal sinuses and mastoids are clear.  Visualized orbit soft tissues are within normal limits. Negative scalp soft tissues.  IMPRESSION: 1.  Confluent acute infarct in the right PCA territory with occipital lobe mild cytotoxic edema.  However, there are also numerous small acute infarcts in the left ACA, left PCA, and bilateral MCA territory.  Constellation of  findings is therefore most suggestive of acute embolic event. 2.  No mass effect or hemorrhage. 3.  See MRA findings below.  MRA HEAD WITHOUT CONTRAST  Technique: Angiographic images of the Circle of Willis were obtained using MRA technique without  intravenous contrast.  Findings: Diminutive distal left vertebral artery with absent antegrade flow signal in the distal neck.  There is some intracranial flow signal, and the left PICA appears patent. Dominant appearing distal right vertebral artery is antegrade flow. Patent right PICA origin.  Right vertebral artery supplies the basilar.  Bilateral AICA origins patent.  The basilar artery is irregular, but in part this might reflect MOTSA artifact.  At most, mild mid basilar artery stenosis.  SCA origins are within normal limits.  The right PCA origin is patent and within normal limits.  Right posterior communicating artery is present.  The left PCA is a fetal type.  There is occlusion or near occlusion of the right PCA in the distal P2/proximal P3 segment (series 503 image 12).  Distal left PCA branches appear patent.  Antegrade flow in both ICA siphons.  Mild ICA irregularity.  No ICA siphon stenosis.  Ophthalmic and both posterior communicating artery origins are within normal limits.  Carotid termini are patent.  MCA and ACA origins are within normal limits.  The anterior communicating arteries diminutive. Visualized bilateral ACA branches are within normal limits. Visualized bilateral MCA branches are within normal limits; no major MCA branch occlusion identified.  IMPRESSION: 1.  Occlusion or near occlusion of the right PCA in the distal P2/proximal P3 segment. 2. Nondominant distal left vertebral artery, might be occluded in the neck and reconstituted from the right vertebral artery in the posterior fossa.  Still, the absence of acute MRI changes in the posterior fossa suggest this is not of acute clinical significance. 3.  Intracranial atherosclerosis but no  other major circle Willis branch occlusion or hemodynamically significant intracranial stenosis.  Study discussed by telephone with Dr. Susie Cassette on 02/01/2013 at 1637 hours.   Original Report Authenticated By: Erskine Speed, M.D.    Mr Brain Wo Contrast  02/01/2013  *RADIOLOGY REPORT*  Clinical Data:  79 year old female found unresponsive.  Left side weakness.  Abnormal head CT.  Comparison:  Head CT without contrast 02/01/2013.  MRI HEAD WITHOUT CONTRAST  Technique: Multiplanar, multiecho pulse sequences of the brain and surrounding structures were obtained according to standard protocol without intravenous contrast.  Findings: There is confluent restricted diffusion in the right occipital lobe PCA territory.  However, there are also multiple sub centimeter punctate foci of restricted diffusion in the bilateral MCA, left ACA, and bilateral PCA territories (see series 11). Diffusion is mildly heterogeneous in the posterior fossa, but no definite posterior fossa restricted diffusion is identified.  Major intracranial vascular flow voids are preserved.  Mild T2 and FLAIR hyperintensity in the right occipital lobe. Minimal to mild T2 and FLAIR hyperintensity at the other acutely affected areas.  Underlying chronic small infarct in the right superior cerebellum.  Mild to moderate for age bilateral deep gray matter nuclei T2 heterogeneity, not all related to the acute findings. No acute intracranial hemorrhage identified.  No midline shift, mass effect, or evidence of mass lesion.  Negative pituitary, cervicomedullary junction and visualized cervical spine. No ventriculomegaly.  Normal bone marrow signal.  Small fluid level in the right maxillary sinus.  Other Visualized paranasal sinuses and mastoids are clear.  Visualized orbit soft tissues are within normal limits. Negative scalp soft tissues.  IMPRESSION: 1.  Confluent acute infarct in the right PCA territory with occipital lobe mild cytotoxic edema.  However, there are  also numerous small acute infarcts in the left ACA, left PCA, and bilateral MCA territory.  Constellation of findings is therefore most suggestive of acute embolic event. 2.  No mass effect or hemorrhage. 3.  See MRA findings below.  MRA HEAD WITHOUT CONTRAST  Technique: Angiographic images of the Circle of Willis were obtained using MRA technique without  intravenous contrast.  Findings: Diminutive distal left vertebral artery with absent antegrade flow signal in the distal neck.  There is some intracranial flow signal, and the left PICA appears patent. Dominant appearing distal right vertebral artery is antegrade flow. Patent right PICA origin.  Right vertebral artery supplies the basilar.  Bilateral AICA origins patent.  The basilar artery is irregular, but in part this might reflect MOTSA artifact.  At most, mild mid basilar artery stenosis.  SCA origins are within normal limits.  The right PCA origin is patent and within normal limits.  Right posterior communicating artery is present.  The left PCA is a fetal type.  There is occlusion or near occlusion of the right PCA in the distal P2/proximal P3 segment (series 503 image 12).  Distal left PCA branches appear patent.  Antegrade flow in both ICA siphons.  Mild ICA irregularity.  No ICA siphon stenosis.  Ophthalmic and both posterior communicating artery origins are within normal limits.  Carotid termini are patent.  MCA and ACA origins are within normal limits.  The anterior communicating arteries diminutive. Visualized bilateral ACA branches are within normal limits. Visualized bilateral MCA branches are within normal limits; no major MCA branch occlusion identified.  IMPRESSION: 1.  Occlusion or near occlusion of the right PCA in the distal P2/proximal P3 segment. 2. Nondominant distal left vertebral artery, might be occluded in the neck and reconstituted from the right vertebral artery in the posterior fossa.  Still, the absence of acute MRI changes in the  posterior fossa suggest this is not of acute clinical significance. 3.  Intracranial atherosclerosis but no other major circle Willis branch occlusion or hemodynamically significant intracranial stenosis.  Study discussed by telephone with Dr. Susie Cassette on 02/01/2013 at 1637 hours.  Original Report Authenticated By: Erskine Speed, M.D.     Scheduled Meds: . aspirin  300 mg Rectal Daily   Or  . aspirin  325 mg Oral Daily  . atorvastatin  10 mg Oral Daily  . enoxaparin (LOVENOX) injection  40 mg Subcutaneous Q24H  . fluticasone  1 spray Each Nare QHS  . irbesartan  300 mg Oral Daily   And  . hydrochlorothiazide  12.5 mg Oral Daily  . insulin aspart  0-9 Units Subcutaneous Q4H  . insulin glargine  5 Units Subcutaneous QHS  . metoCLOPramide  10 mg Oral Daily  . metoprolol succinate  50 mg Oral Daily   Continuous Infusions:    Marinda Elk  [facility] Pager [phone]. If 8PM-8AM, please contact night-coverage at [email or web address], password [facility] 02/03/2013, 8:54 AM  LOS: 2 days

## 2013-02-04 ENCOUNTER — Encounter (HOSPITAL_COMMUNITY)
Admission: EM | Disposition: A | Discharge status: Skilled Nursing Facility | Payer: Self-pay | Source: Home / Self Care | Attending: Internal Medicine

## 2013-02-04 DIAGNOSIS — I2510 Atherosclerotic heart disease of native coronary artery without angina pectoris: Secondary | ICD-10-CM

## 2013-02-04 LAB — GLUCOSE, CAPILLARY: Glucose-Capillary: 207 mg/dL — ABNORMAL HIGH (ref 70–99)

## 2013-02-04 SURGERY — ECHOCARDIOGRAM, TRANSESOPHAGEAL
Anesthesia: Moderate Sedation

## 2013-02-04 MED ORDER — INSULIN GLARGINE 100 UNIT/ML ~~LOC~~ SOLN
20.0000 [IU] | Freq: Every day | SUBCUTANEOUS | Status: DC
  Administered 2013-02-04 – 2013-02-07 (×4): 20 [IU] via SUBCUTANEOUS
  Filled 2013-02-04 (×5): qty 0.2

## 2013-02-04 MED ORDER — ISOSORB DINITRATE-HYDRALAZINE 20-37.5 MG PO TABS
1.0000 | ORAL_TABLET | Freq: Two times a day (BID) | ORAL | Status: DC
  Administered 2013-02-04 (×2): 1 via ORAL
  Filled 2013-02-04 (×4): qty 1

## 2013-02-04 MED ORDER — INSULIN GLARGINE 100 UNIT/ML ~~LOC~~ SOLN
10.0000 [IU] | Freq: Once | SUBCUTANEOUS | Status: AC
  Administered 2013-02-04: 10 [IU] via SUBCUTANEOUS
  Filled 2013-02-04: qty 0.1

## 2013-02-04 MED ORDER — HYDRALAZINE HCL 20 MG/ML IJ SOLN: 10.0000 mg | Freq: Four times a day (QID) | INTRAMUSCULAR | Status: DC | PRN

## 2013-02-04 MED ORDER — AMLODIPINE BESYLATE 10 MG PO TABS
10.0000 mg | ORAL_TABLET | Freq: Every day | ORAL | Status: DC
  Administered 2013-02-05 – 2013-02-08 (×4): 10 mg via ORAL
  Filled 2013-02-04 (×4): qty 1

## 2013-02-04 NOTE — Progress Notes (Signed)
[facility] PROGRESS NOTE Assessment/Plan:  *CVA (cerebral infarction) ? Embolic: - Allow a degree of permisive HTN, Start bidil. - Cardiac markers negative. Cont ASA. - MRI as below - TTE pending - Statins. Carotid doppler below. - PT/OT pending. - SN[name].   Diabetes mellitus, insulin dependent (IDDM), controlled - High, low dose lantus cont SSI. - HbgA1c. > 9.0 last time.   Diastolic CHF, chronic/HYPERTENSION - lower extremity edema. - cont Avapro, HCTZ, metorpolol  HYPERCHOLESTEROLEMIA - statins  Code Status: full  Family Communication: bedside  Disposition Plan: admit    Consultants:  Neuro  Procedures: - MRI 4.18.2014: acute infarct in the right PCA territory with occipital lobe mild cytotoxic edema. However, there are also numerous small acute infarcts in the left ACA, left PCA, and bilateral MCA territory. - MRA: Occlusion or near occlusion of the right PCA in the distal P2/proximal P3 segment - Carotid doppler 4.19.2014: no obvious evidence of hemodynamically significant internal carotid artery stenosis >40% bilaterally. Vertebral arteries are patent with antegrade flow - Echo 4.19.2014: estimated ejection fraction was in the range of 60% to 65   Antibiotics:  none   HPI/Subjective:  has no complains   Objective: Filed Vitals:   02/04/13 0000 02/04/13 0400 02/04/13 0952 02/04/13 1128  BP: 186/107 177/102 198/112 208/108  Pulse: 97 90 86 86  Temp: 99.3 F (37.4 C) 98.8 F (37.1 C) 98.8 F (37.1 C)   TempSrc: Axillary Oral Oral   Resp: 20 20 20    Height:      Weight:      SpO2: 92% 90% 95% 93%    Intake/Output Summary (Last 24 hours) at 02/04/13 1157 Last data filed at 02/04/13 0900  Gross per 24 hour  Intake 1778.25 ml  Output      0 ml  Net 1778.25 ml   Filed Weights   02/02/13 1400  Weight: 74.7 kg (164 lb 10.9 oz)    Exam:  General: Alert, awake, oriented x2, in no acute distress.  HEENT: No bruits,  no goiter.  Heart: Regular rate and rhythm, without murmurs, rubs, gallops.  Lungs: Good air movement, clear to auscultation.  Abdomen: Soft, nontender, nondistended, positive bowel sounds.  Neuro: answer some question no pstosis, extra-ocular movement intact, no facial asymmetry, MS intact, normal finger to one.   Data Reviewed: Basic Metabolic Panel:  Recent Labs Lab 01/29/13 1120 02/01/13 1351 02/01/13 1430 02/01/13 1651  NA 137 138 141  --   K 3.6 4.0 3.9  --   CL 100 100 105  --   CO2 26 22  --   --   GLUCOSE 242* 314* 322*  --   BUN 9 17 17   --   CREATININE 0.6 0.53 0.60 0.48*  CALCIUM 8.8 9.8  --   --    Liver Function Tests:  Recent Labs Lab 01/29/13 1120 02/01/13 1351  AST 18 41*  ALT 17 23  ALKPHOS 116 163*  BILITOT 0.6 0.7  PROT 6.6 7.8  ALBUMIN 3.5 3.6   No results found for this basename: LIPASE, AMYLASE,  in the last 168 hours No results found for this basename: AMMONIA,  in the last 168 hours CBC:  Recent Labs Lab 01/29/13 1120 02/01/13 1351 02/01/13 1430 02/01/13 1651  WBC 5.8 8.1  --  9.0  NEUTROABS 3.9 6.6  --   --   HGB 13.8 15.7* 16.3* 16.5*  HCT 40.8 45.4 48.0* 46.7*  MCV 88.5 87.0  --  85.5  PLT 168.0 183  --  184   Cardiac Enzymes:  Recent Labs Lab 02/01/13 1355 02/01/13 1651 02/01/13 2218 02/02/13 0420  CKTOTAL  --  2670*  --   --   CKMB  --  10.5*  --   --   TROPONINI <0.30 <0.30 <0.30 <0.30   BNP (last 3 results) No results found for this basename: PROBNP,  in the last 8760 hours CBG:  Recent Labs Lab 02/03/13 1622 02/03/13 2057 02/04/13 0003 02/04/13 0358 02/04/13 0742  GLUCAP 228* 252* 217* 120* 223*    Recent Results (from the past 240 hour(s))  MRSA PCR SCREENING     Status: None   Collection Time    02/01/13  4:56 PM      Result Value Range Status   MRSA by PCR NEGATIVE  NEGATIVE Final   Comment:            The GeneXpert MRSA Assay (FDA     approved for NASAL specimens     only), is one component  of a     comprehensive MRSA colonization     surveillance program. It is not     intended to diagnose MRSA     infection nor to guide or     monitor treatment for     MRSA infections.     Studies: No results found.  Scheduled Meds: . [START ON 02/05/2013] amLODipine  10 mg Oral Daily  . aspirin  300 mg Rectal Daily   Or  . aspirin  325 mg Oral Daily  . atorvastatin  40 mg Oral Daily  . enoxaparin (LOVENOX) injection  40 mg Subcutaneous Q24H  . fluticasone  1 spray Each Nare QHS  . hydrochlorothiazide  25 mg Oral Daily   And  . irbesartan  300 mg Oral Daily  . insulin aspart  0-9 Units Subcutaneous Q4H  . insulin glargine  5 Units Subcutaneous QHS  . isosorbide-hydrALAZINE  1 tablet Oral BID  . metoCLOPramide  10 mg Oral Daily  . metoprolol succinate  50 mg Oral Daily   Continuous Infusions: . sodium chloride 75 mL/hr at 02/04/13 0035     Marinda Elk  [facility] Pager [phone]. If 8PM-8AM, please contact night-coverage at [email or web address], password [facility] 02/04/2013, 11:57 AM  LOS: 3 days

## 2013-02-04 NOTE — Progress Notes (Signed)
Stroke Team Progress Note  HISTORY Who was brought to her PCP on 01/29/13 by "EMS per pt request as she lives alone, no family, and hard to stand or walk with worsening back pain, balance problem and 3 falls in the past month, last one yesterday with large painful bruise to left wrist. Has not been able to get her chronic pain med recently as she had no transport to come pick up the rx personally as per office policy. Has been out of most meds includiing the benicar HCT for at least 2 wks, with worsening LE edema below knees, better in the AM." Patient has no further records on file. Her friends came over and patient was not answering her door. Due to concern they opened the door and found her in her recliner, in urine and not moving. Patient was brought to ED and found to have Left UE and LE weakness. CT head showed an area of decreased attenuation in the right occipital / posterior right parietal lobe this is suspicious for evolving ischemia. Neurology was consulted to see patient for worrisome infarct. Patient admits to missing her ASA over last few days. Patient was not a TPA candidate secondary to unknown time last seem well. She was admitted for further evaluation and treatment.  SUBJECTIVE Her soon-to-be POAs are at the bedside, completing paper work. Overall she feels her condition is stable.   OBJECTIVE Most recent Vital Signs: Filed Vitals:   02/04/13 0000 02/04/13 0400 02/04/13 0952 02/04/13 1128  BP: 186/107 177/102 198/112 208/108  Pulse: 97 90 86 86  Temp: 99.3 F (37.4 C) 98.8 F (37.1 C) 98.8 F (37.1 C)   TempSrc: Axillary Oral Oral   Resp: 20 20 20    Height:      Weight:      SpO2: 92% 90% 95% 93%   CBG (last 3)   Recent Labs  02/04/13 0003 02/04/13 0358 02/04/13 0742  GLUCAP 217* 120* 223*   IV Fluid Intake:   . sodium chloride 75 mL/hr at 02/04/13 0035    MEDICATIONS  . [START ON 02/05/2013] amLODipine  10 mg Oral Daily  . aspirin  300 mg Rectal Daily   Or  .  aspirin  325 mg Oral Daily  . atorvastatin  40 mg Oral Daily  . enoxaparin (LOVENOX) injection  40 mg Subcutaneous Q24H  . fluticasone  1 spray Each Nare QHS  . hydrochlorothiazide  25 mg Oral Daily   And  . irbesartan  300 mg Oral Daily  . insulin aspart  0-9 Units Subcutaneous Q4H  . insulin glargine  10 Units Subcutaneous Once  . insulin glargine  20 Units Subcutaneous QHS  . isosorbide-hydrALAZINE  1 tablet Oral BID  . metoCLOPramide  10 mg Oral Daily  . metoprolol succinate  50 mg Oral Daily   PRN:  clonazePAM, labetalol, senna-docusate  Diet:  NPO  Activity:  Bedrest DVT Prophylaxis:  Lovenox 40 mg sq daily   CLINICALLY SIGNIFICANT STUDIES Basic Metabolic Panel:  Recent Labs Lab 01/29/13 1120 02/01/13 1351 02/01/13 1430 02/01/13 1651  NA 137 138 141  --   K 3.6 4.0 3.9  --   CL 100 100 105  --   CO2 26 22  --   --   GLUCOSE 242* 314* 322*  --   BUN 9 17 17   --   CREATININE 0.6 0.53 0.60 0.48*  CALCIUM 8.8 9.8  --   --    Liver Function Tests:  Recent Labs  Lab 01/29/13 1120 02/01/13 1351  AST 18 41*  ALT 17 23  ALKPHOS 116 163*  BILITOT 0.6 0.7  PROT 6.6 7.8  ALBUMIN 3.5 3.6   CBC:  Recent Labs Lab 01/29/13 1120 02/01/13 1351 02/01/13 1430 02/01/13 1651  WBC 5.8 8.1  --  9.0  NEUTROABS 3.9 6.6  --   --   HGB 13.8 15.7* 16.3* 16.5*  HCT 40.8 45.4 48.0* 46.7*  MCV 88.5 87.0  --  85.5  PLT 168.0 183  --  184   Coagulation:  Recent Labs Lab 02/01/13 1351  LABPROT 14.2  INR 1.11   Cardiac Enzymes:  Recent Labs Lab 02/01/13 1651 02/01/13 2218 02/02/13 0420  CKTOTAL 2670*  --   --   CKMB 10.5*  --   --   TROPONINI <0.30 <0.30 <0.30   Urinalysis:  Recent Labs Lab 02/01/13 1452  COLORURINE YELLOW  LABSPEC 1.031*  PHURINE 5.5  GLUCOSEU >1000*  HGBUR NEGATIVE  BILIRUBINUR LARGE*  KETONESUR >80*  PROTEINUR 30*  UROBILINOGEN 1.0  NITRITE NEGATIVE  LEUKOCYTESUR NEGATIVE   Lipid Panel    Component Value Date/Time   CHOL 275*  02/02/2013 0420   TRIG 171* 02/02/2013 0420   HDL 25* 02/02/2013 0420   CHOLHDL 11.0 02/02/2013 0420   VLDL 34 02/02/2013 0420   LDLCALC 216* 02/02/2013 0420   HgbA1C  Lab Results  Component Value Date   HGBA1C 8.8* 02/03/2013    Urine Drug Screen:     Component Value Date/Time   LABOPIA NONE DETECTED 02/01/2013 1452   COCAINSCRNUR NONE DETECTED 02/01/2013 1452   LABBENZ NONE DETECTED 02/01/2013 1452   AMPHETMU NONE DETECTED 02/01/2013 1452   THCU NONE DETECTED 02/01/2013 1452   LABBARB NONE DETECTED 02/01/2013 1452    Alcohol Level:  Recent Labs Lab 02/01/13 1351  ETH <11    No results found.  CT of the brain  02/01/2013 1.  Area of decreased attenuation in the right occipital / posterior right parietal lobe best seen in axial image 17 and 16. This is suspicious for evolving ischemia.  Further evaluation with brain MRI with diffusion imaging is recommended. 2.  No intracranial hemorrhage, mass effect or midline shift.  Mild cerebral atrophy.   MRI of the brain  02/01/2013 1.  Confluent acute infarct in the right PCA territory with occipital lobe mild cytotoxic edema.  However, there are also numerous small acute infarcts in the left ACA, left PCA, and bilateral MCA territory.  Constellation of findings is therefore most suggestive of acute embolic event. 2.  No mass effect or hemorrhage.   MRA of the brain  02/01/2013   1.  Occlusion or near occlusion of the right PCA in the distal P2/proximal P3 segment. 2. Nondominant distal left vertebral artery, might be occluded in the neck and reconstituted from the right vertebral artery in the posterior fossa.  Still, the absence of acute MRI changes in the posterior fossa suggest this is not of acute clinical significance. 3.  Intracranial atherosclerosis but no other major circle Willis branch occlusion or hemodynamically significant intracranial stenosis.  2D Echocardiogram  EF 60-65% with no source of embolus.   TEE    Carotid Doppler  No  evidence of hemodynamically significant internal carotid artery stenosis. Vertebral artery flow is antegrade.   CXR  02/01/2013  Possible mild right upper and lower lobe opacity, aspiration or pneumonia not excluded.    EKG  normal sinus rhythm, PAC's noted.   Therapy Recommendations SNF  Physical Exam   Gen: In bed, NAD  MS: Awake, Alert, oriented to hospital, but not [place]. Severe left neglect.  WU:JWJXB, right gaze preference, but will track with smooth pursuit across midline.  Motor: 5/5 on right, at least 4+/5 on left, though still difficult to test due to neglect.  Sensory:continues to identify left sided stimuli as occuring on right.   ASSESSMENT [name] is a 79 y.o. female presenting with Left UE and LE weakness, found down. Imaging confirms a right PCA and left MCA and PCA infarcts. Right occipital lobe with mild cytotoxic edema.  Infarcts felt to be  embolic secondary to unknown etiology. atrial fibrillation is the most common etiology in this age group.  On  no antiplatelets prior to admission. Now on aspirin 325 mg orally every day for secondary stroke prevention. Patient with resultant left hemiparesis. Work up underway.  Diabetes, uncontrolled, HgbA1c 8.8, goal < 7.0  Hypertension Hyperlipidemia, LDL 216, on statin PTA, on statin now, goal LDL < 100 (<70 for diabetics) CAD Chronic back pain  Hospital day # 3  TREATMENT/PLAN  Continue aspirin 325 mg orally every day for secondary stroke prevention. TEE to look for embolic source. If positive for PFO (patent foramen ovale), check bilateral lower extremity venous dopplers to rule out DVT as possible source of stroke.  SNF at time of discharge  Annie Main, MSN, RN, ANVP-BC, ANP-BC, [facility] Pager: [phone] 02/04/2013 12:02 PM  I have personally obtained a history, examined the patient, evaluated imaging results, and formulated the assessment and plan of care. I agree with the  above. Delia Heady, MD

## 2013-02-04 NOTE — Progress Notes (Signed)
02/04/13 1128  Vitals  BP ! 208/108 mmHg  BP Location Right arm  BP Method Automatic  Patient Position, if appropriate Lying  Pulse Rate 86  Pulse Rate Source Dinamap  Oxygen Therapy  SpO2 93 %  O2 Device None (Room air)  Paged Dr Radonna Ricker. Awaiting response.  Madelin Rear RN, MSN, [employer]

## 2013-02-04 NOTE — Progress Notes (Signed)
PT Cancellation Note  Patient Details Name: [name] MRN: [id number] DOB: 1934-01-19   Cancelled Treatment:    Reason Eval/Treat Not Completed: Medical issues which prohibited therapy (latest BP 198/112 is outside "safe for PT" guidelines.)   Donnetta Hail 02/04/2013, 11:28 AM

## 2013-02-04 NOTE — Progress Notes (Signed)
UR COMPLETED  

## 2013-02-04 NOTE — Progress Notes (Signed)
INITIAL NUTRITION ASSESSMENT  DOCUMENTATION CODES Per approved criteria  -Not Applicable   INTERVENTION: 1. Add Ensure Pudding po TID, each supplement provides 170 kcal and 4 grams of protein, once diet has advanced   NUTRITION DIAGNOSIS: Inadequate oral intake related to inability to eat as evidenced by NPO diet status.   Goal: PO intake to promote skin integrity  Monitor:  PO intake, weight trends, labs, skin breakdown   Reason for Assessment: Low Braden Score  79 y.o. female  Admitting Dx: CVA (cerebral infarction)  ASSESSMENT: Pt was found at home after not being heard from for several days, pt had left sided weakness, AMS, and speech difficulties. MRI showed infarcts suggestive of cardio embolic etiology.  Placed on dysphagia 2 diet. Poor meal completion. Pt did not respond to any questions asked. Weight hx shows weight has been trending down for the past year, unsure if this is intentional weight loss.  Braden score indicates pt is at increased risk for skin breakdown. Once diet has been advanced, RD will add nutrition supplements.  Height: Ht Readings from Last 1 Encounters:  02/02/13 5\' 3"  (1.6 m)    Weight: Wt Readings from Last 1 Encounters:  02/02/13 164 lb 10.9 oz (74.7 kg)    Ideal Body Weight: 115 lbs   % Ideal Body Weight: 142%  Wt Readings from Last 10 Encounters:  02/02/13 164 lb 10.9 oz (74.7 kg)  02/02/13 164 lb 10.9 oz (74.7 kg)  09/05/12 185 lb (83.915 kg)  07/31/12 186 lb (84.369 kg)  06/07/12 191 lb (86.637 kg)  05/30/12 191 lb 6 oz (86.807 kg)  05/01/12 192 lb (87.091 kg)  12/29/11 199 lb 9.6 oz (90.538 kg)  11/01/11 198 lb (89.812 kg)  08/30/11 204 lb 2 oz (92.59 kg)    Usual Body Weight: 190 lbs   % Usual Body Weight: 86%  BMI:  Body mass index is 29.18 kg/(m^2).  Estimated Nutritional Needs: Kcal: 1450-1700 Protein: 75-85 gm  Fluid: 1.5-1.7 L   Skin: intact   Diet Order: NPO  EDUCATION NEEDS: -No education needs  identified at this time   Intake/Output Summary (Last 24 hours) at 02/04/13 0959 Last data filed at 02/04/13 0900  Gross per 24 hour  Intake 1778.25 ml  Output      0 ml  Net 1778.25 ml    Last BM: 4/20   Labs:   Recent Labs Lab 01/29/13 1120 02/01/13 1351 02/01/13 1430 02/01/13 1651  NA 137 138 141  --   K 3.6 4.0 3.9  --   CL 100 100 105  --   CO2 26 22  --   --   BUN 9 17 17   --   CREATININE 0.6 0.53 0.60 0.48*  CALCIUM 8.8 9.8  --   --   GLUCOSE 242* 314* 322*  --     CBG (last 3)   Recent Labs  02/04/13 0003 02/04/13 0358 02/04/13 0742  GLUCAP 217* 120* 223*    Scheduled Meds: . amLODipine  5 mg Oral Daily  . aspirin  300 mg Rectal Daily   Or  . aspirin  325 mg Oral Daily  . atorvastatin  40 mg Oral Daily  . enoxaparin (LOVENOX) injection  40 mg Subcutaneous Q24H  . fluticasone  1 spray Each Nare QHS  . hydrochlorothiazide  25 mg Oral Daily   And  . irbesartan  300 mg Oral Daily  . insulin aspart  0-9 Units Subcutaneous Q4H  . insulin glargine  5 Units Subcutaneous QHS  . metoCLOPramide  10 mg Oral Daily  . metoprolol succinate  50 mg Oral Daily    Continuous Infusions: . sodium chloride 75 mL/hr at 02/04/13 0035    Past Medical History  Diagnosis Date  . Encounter for long-term (current) use of other medications     Anitihyperlipidemic use, Long term  . DIABETES MELLITUS, TYPE I 05/30/2007  . HYPERCHOLESTEROLEMIA 10/23/2008  . HYPERTENSION 05/30/2007  . OSTEOARTHRITIS, LUMBAR SPINE 12/25/2007  . Depression 10/29/2011  . GERD (gastroesophageal reflux disease) 10/29/2011  . Diastolic CHF, chronic   . CAD (coronary artery disease)   . Anxiety problem   . Wedge compression fracture of T11-T12 vertebra   . Lumbar degenerative disc disease   . Anemia, unspecified   . Lumbar spinal stenosis   . Allergic rhinitis, cause unspecified 10/29/2011  . Osteoporosis 10/29/2011  . Failure to thrive in childhood 10/29/2011  . Osteoarthritis of right knee  10/29/2011  . History of colon polyps 11/01/2011  . Chronic pain 11/01/2011    Past Surgical History  Procedure Laterality Date  . Abdominal hysterectomy    . Cholecystectomy    . Appendectomy    . Back surgury      Clarene Duke RD, LDN Pager [phone] After Hours pager [phone]

## 2013-02-04 NOTE — Clinical Documentation Improvement (Signed)
Abnormal Labs Clarification  THIS DOCUMENT IS NOT A PERMANENT PART OF THE MEDICAL RECORD  TO RESPOND TO THE THIS QUERY, FOLLOW THE INSTRUCTIONS BELOW:  1. If needed, update documentation for the patient's encounter via the notes activity.  2. Access this query again and click edit on the [employer].  3. After updating, or not, click F2 to complete all highlighted (required) fields concerning your review. Select "additional documentation in the medical record" OR "no additional documentation provided".  4. Click Sign note button.  5. The deficiency will fall out of your InBasket *Please let us know if you are not able to complete this workflow by phone or e-mail (listed below).  Please update your documentation within the medical record to reflect your response to this query.                                                                                   02/04/13  Dear Annie Main, MSN, RN, ANVP-BC, ANP-BC, GNP-BC [facility]  In a better effort to capture your patient's severity of illness, reflect appropriate length of stay and utilization of resources, a review of the medical record has revealed the following indicators.    Based on your clinical judgment, please clarify and document in a progress note and/or discharge summary the clinical condition associated with the following supporting information:  In responding to this query please exercise your independent judgment.  The fact that a query is asked, does not imply that any particular answer is desired or expected.  Abnormal findings (laboratory, x-ray, pathologic, and other diagnostic results) are not coded and reported unless the physician indicates their clinical significance.   The medical record reflects the following clinical findings, please clarify the diagnostic and/or clinical significance:      Per MRI on 02/01/13 "acute infarct in the right PCA territory with occipital lobe mild cytotoxic edema", if this is an  appropriate secondary diagnosis, please document in the progress notes.  Thank you   Possible Clinical Conditions?      cytotoxic edema  Other Condition               Cannot Clinically Determine   Reviewed: additional documentation in the medical record to note 02/04/2013. Please let me know if not adequate.   Thank You,  Leonette Most Addison  Clinical Documentation Specialist, BSN: Pager [phone]  [facility] Information Management [place]

## 2013-02-04 NOTE — Progress Notes (Signed)
Occupational Therapy Evaluation Patient Details Name: JONIYAH MALLINGER MRN: [id number] DOB: [birth year] Today's Date: 02/04/2013 Time: 6213-0865 OT Time Calculation (min): 45 min  OT Assessment / Plan / Recommendation Clinical Impression  Pt admitted with L sided weakness. MRI (+) R occipital/parietal infarcts with scattered infarcts in B MCA and ACA territory. Pt found in recliner after friends did not hear from her for 3 days. Pt with apparent L neglect with R gaze preference and L hemiparesis.  Pt will benefit from skilled OT services to facilitate D/C to SNF due to below deficits. Pt apparently with limited caregiver support.    OT Assessment  Patient needs continued OT Services    Follow Up Recommendations  SNF    Barriers to Discharge Decreased caregiver support    Equipment Recommendations  None recommended by OT    Recommendations for Other Services    Frequency  Min 2X/week    Precautions / Restrictions Precautions Precautions: Fall Precaution Comments: L neglect   Pertinent Vitals/Pain High BP earlier. nsg said OK to participate with OT    ADL  Eating/Feeding: Maximal assistance Where Assessed - Eating/Feeding: Chair Grooming: +1 Total assistance Where Assessed - Grooming: Supported sitting Upper Body Bathing: +1 Total assistance Where Assessed - Upper Body Bathing: Supported sitting Lower Body Bathing: +1 Total assistance Where Assessed - Lower Body Bathing: Supported sit to stand Upper Body Dressing: +1 Total assistance Where Assessed - Upper Body Dressing: Supported sitting Lower Body Dressing: +1 Total assistance Where Assessed - Lower Body Dressing: Supported sit to Pharmacist, hospital: +2 Total assistance Toilet Transfer: Patient Percentage: 20% Statistician Method: Surveyor, minerals: Other (comment) Toileting - Clothing Manipulation and Hygiene: +1 Total assistance Equipment Used: Gait belt Transfers/Ambulation Related to ADLs:  +2 total A ADL Comments: limited by L neglect, inattention, poor postural control, cognitive deficits, visual/perceptual deficits    OT Diagnosis: Generalized weakness;Cognitive deficits;Disturbance of vision;Altered mental status;Apraxia  OT Problem List: Decreased strength;Decreased range of motion;Decreased activity tolerance;Impaired balance (sitting and/or standing);Impaired vision/perception;Decreased coordination;Decreased cognition;Decreased safety awareness;Decreased knowledge of use of DME or AE;Decreased knowledge of precautions;Cardiopulmonary status limiting activity;Impaired sensation;Obesity;Impaired UE functional use OT Treatment Interventions: Self-care/ADL training;Neuromuscular education;DME and/or AE instruction;Therapeutic activities;Cognitive remediation/compensation;Visual/perceptual remediation/compensation;Patient/family education;Balance training   OT Goals Acute Rehab OT Goals OT Goal Formulation: Patient unable to participate in goal setting Time For Goal Achievement: 02/18/13 Potential to Achieve Goals: Good ADL Goals Pt Will Perform Eating: with supervision;with set-up;Supported;Sitting, chair ADL Goal: Eating - Progress: Goal set today Pt Will Perform Grooming: with mod assist;Sitting, chair;Supported ADL Goal: Grooming - Progress: Goal set today Pt Will Perform Upper Body Bathing: with mod assist;Sitting, chair;Supported ADL Goal: Product manager - Progress: Goal set today Additional ADL Goal #1: Pt will maintain midline postural control sitting EOB with vc.  ADL Goal: Additional Goal #1 - Progress: Goal set today Additional ADL Goal #2: Pt will locate 3/3 items for simple ADL task L of midline to increase attention L visual field.  ADL Goal: Additional Goal #2 - Progress: Goal set today  Visit Information  Last OT Received On: 02/04/13 Assistance Needed: +2    Subjective Data      Prior Functioning     Home Living Lives With: Alone Available  Help at Discharge: Skilled Nursing Facility Prior Function Level of Independence: Independent Able to Take Stairs?: Yes Driving: No Communication Communication: Expressive difficulties Dominant Hand: Right         Vision/Perception Vision - History Baseline Vision: Other (comment) (  unknown) Vision - Assessment Eye Alignment: Within Functional Limits Vision Assessment: Vision tested Ocular Range of Motion: Restricted on the left Alignment/Gaze Preference: Head turned;Gaze right Tracking/Visual Pursuits: Decreased smoothness of eye movement to LEFT superior field;Decreased smoothness of eye movement to LEFT inferior field;Unable to hold eye position out of midline;Impaired - to be further tested in functional context Saccades: Impaired - to be further tested in functional context Visual Fields: Left homonymous hemianopsia Additional Comments: apparent L neglect. unable to fully assess due to cognition.  Perception Perception: Impaired Inattention/Neglect: Does not attend to left visual field;Does not attend to left side of body Praxis Praxis: Impaired Praxis Impairment Details: Initiation;Perseveration Praxis-Other Comments: poor initiation. . requires hand over hand to initiate   Cognition  Cognition Arousal/Alertness: Lethargic Behavior During Therapy: Flat affect Overall Cognitive Status: Impaired/Different from baseline Area of Impairment: Orientation;Attention;Memory;Following commands;Safety/judgement;Awareness;Problem solving Orientation Level: Disoriented to;Place;Time;Situation Current Attention Level: Focused Memory: Decreased recall of precautions Following Commands: Follows one step commands inconsistently Safety/Judgement: Decreased awareness of safety;Decreased awareness of deficits Awareness: Intellectual Problem Solving: Slow processing;Decreased initiation;Difficulty sequencing General Comments: increased time to respond to all questions, decreased  awarenss of deficits    Extremity/Trunk Assessment Right Upper Extremity Assessment RUE ROM/Strength/Tone: Deficits;Unable to fully assess RUE ROM/Strength/Tone Deficits: only moves arm @ 30 FF RUE Sensation: Deficits RUE Sensation Deficits: unable to fully assess Left Upper Extremity Assessment LUE ROM/Strength/Tone: Deficits LUE ROM/Strength/Tone Deficits: PROM WFL. Does not initate use, however, spontaneous finger flexion when asked to squeeze hand Right Lower Extremity Assessment RLE ROM/Strength/Tone: Unable to fully assess;Deficits Left Lower Extremity Assessment LLE ROM/Strength/Tone: Deficits Trunk Assessment Trunk Assessment: Other exceptions Trunk Exceptions: poor postural control. Right bias     Mobility Bed Mobility Bed Mobility: Supine to Sit Rolling Left: 1: +2 Total assist Rolling Left: Patient Percentage: 20% Sitting - Scoot to Edge of Bed: 1: +1 Total assist Transfers Transfers: Sit to Stand;Stand to Sit Sit to Stand: 1: +2 Total assist;From chair/3-in-1 Sit to Stand: Patient Percentage: 20% Stand to Sit: Patient Percentage: 20% Details for Transfer Assistance: Does not initiate any movement. attmepts to assist with standing. Quad control BLE.     Exercise     Balance Balance Balance Assessed: Yes Static Sitting Balance Static Sitting - Balance Support: Feet supported;Right upper extremity supported Static Sitting - Level of Assistance: 3: Mod assist;Other (comment) (Pt falling R)   End of Session OT - End of Session Equipment Utilized During Treatment: Gait belt Activity Tolerance: Patient tolerated treatment well Patient left: in chair;with call bell/phone within reach Nurse Communication: Mobility status;Need for lift equipment (maximove)  GO     Naasia Weilbacher,HILLARY 02/04/2013, 4:06 PM [facility], OTR/L  [phone] 02/04/2013

## 2013-02-05 ENCOUNTER — Other Ambulatory Visit: Payer: Medicare Other

## 2013-02-05 DIAGNOSIS — F411 Generalized anxiety disorder: Secondary | ICD-10-CM

## 2013-02-05 DIAGNOSIS — F329 Major depressive disorder, single episode, unspecified: Secondary | ICD-10-CM

## 2013-02-05 LAB — GLUCOSE, CAPILLARY
Glucose-Capillary: 139 mg/dL — ABNORMAL HIGH (ref 70–99)
Glucose-Capillary: 142 mg/dL — ABNORMAL HIGH (ref 70–99)
Glucose-Capillary: 167 mg/dL — ABNORMAL HIGH (ref 70–99)

## 2013-02-05 MED ORDER — HYDROCHLOROTHIAZIDE 25 MG PO TABS: 12.5000 mg | ORAL_TABLET | Freq: Every day | ORAL | Status: DC

## 2013-02-05 MED ORDER — ISOSORB DINITRATE-HYDRALAZINE 20-37.5 MG PO TABS
2.0000 | ORAL_TABLET | Freq: Two times a day (BID) | ORAL | Status: DC
  Administered 2013-02-05 – 2013-02-08 (×6): 2 via ORAL
  Filled 2013-02-05 (×8): qty 2

## 2013-02-05 MED ORDER — HYDROCHLOROTHIAZIDE 12.5 MG PO CAPS
12.5000 mg | ORAL_CAPSULE | Freq: Every day | ORAL | Status: DC
  Administered 2013-02-05: 12.5 mg via ORAL
  Filled 2013-02-05 (×2): qty 1

## 2013-02-05 NOTE — Progress Notes (Signed)
Stroke Team Progress Note  HISTORY Who was brought to her PCP on 01/29/13 by "EMS per pt request as she lives alone, no family, and hard to stand or walk with worsening back pain, balance problem and 3 falls in the past month, last one yesterday with large painful bruise to left wrist. Has not been able to get her chronic pain med recently as she had no transport to come pick up the rx personally as per office policy. Has been out of most meds includiing the benicar HCT for at least 2 wks, with worsening LE edema below knees, better in the AM." Patient has no further records on file. Her friends came over and patient was not answering her door. Due to concern they opened the door and found her in her recliner, in urine and not moving. Patient was brought to ED and found to have Left UE and LE weakness. CT head showed an area of decreased attenuation in the right occipital / posterior right parietal lobe this is suspicious for evolving ischemia. Neurology was consulted to see patient for worrisome infarct. Patient admits to missing her ASA over last few days. Patient was not a TPA candidate secondary to unknown time last seem well. She was admitted for further evaluation and treatment.  SUBJECTIVE No family/friends at bedside. Patient without complaints. Awaiting TEE.  OBJECTIVE Most recent Vital Signs: Filed Vitals:   02/04/13 2110 02/05/13 0023 02/05/13 0412 02/05/13 0945  BP: 173/105 179/81 168/77 184/80  Pulse: 82 90 79 86  Temp:  98.9 F (37.2 C) 99.1 F (37.3 C) 98.1 F (36.7 C)  TempSrc:  Axillary Axillary Oral  Resp:  16 16 20   Height:      Weight:      SpO2:  91% 92% 94%   CBG (last 3)   Recent Labs  02/05/13 0413 02/05/13 0751 02/05/13 1210  GLUCAP 137* 142* 169*   IV Fluid Intake:   . sodium chloride 75 mL/hr at 02/05/13 0433    MEDICATIONS  . amLODipine  10 mg Oral Daily  . aspirin  300 mg Rectal Daily   Or  . aspirin  325 mg Oral Daily  . atorvastatin  40 mg Oral  Daily  . enoxaparin (LOVENOX) injection  40 mg Subcutaneous Q24H  . fluticasone  1 spray Each Nare QHS  . hydrochlorothiazide  12.5 mg Oral Daily  . hydrochlorothiazide  25 mg Oral Daily   And  . irbesartan  300 mg Oral Daily  . insulin aspart  0-9 Units Subcutaneous Q4H  . insulin glargine  20 Units Subcutaneous QHS  . isosorbide-hydrALAZINE  2 tablet Oral BID  . metoCLOPramide  10 mg Oral Daily  . metoprolol succinate  50 mg Oral Daily   PRN:  clonazePAM, hydrALAZINE, labetalol, senna-docusate  Diet:  NPO  Activity:  Bedrest DVT Prophylaxis:  Lovenox 40 mg sq daily   CLINICALLY SIGNIFICANT STUDIES Basic Metabolic Panel:   Recent Labs Lab 02/01/13 1351 02/01/13 1430 02/01/13 1651  NA 138 141  --   K 4.0 3.9  --   CL 100 105  --   CO2 22  --   --   GLUCOSE 314* 322*  --   BUN 17 17  --   CREATININE 0.53 0.60 0.48*  CALCIUM 9.8  --   --    Liver Function Tests:   Recent Labs Lab 02/01/13 1351  AST 41*  ALT 23  ALKPHOS 163*  BILITOT 0.7  PROT 7.8  ALBUMIN  3.6   CBC:   Recent Labs Lab 02/01/13 1351 02/01/13 1430 02/01/13 1651  WBC 8.1  --  9.0  NEUTROABS 6.6  --   --   HGB 15.7* 16.3* 16.5*  HCT 45.4 48.0* 46.7*  MCV 87.0  --  85.5  PLT 183  --  184   Coagulation:   Recent Labs Lab 02/01/13 1351  LABPROT 14.2  INR 1.11   Cardiac Enzymes:   Recent Labs Lab 02/01/13 1651 02/01/13 2218 02/02/13 0420  CKTOTAL 2670*  --   --   CKMB 10.5*  --   --   TROPONINI <0.30 <0.30 <0.30   Urinalysis:   Recent Labs Lab 02/01/13 1452  COLORURINE YELLOW  LABSPEC 1.031*  PHURINE 5.5  GLUCOSEU >1000*  HGBUR NEGATIVE  BILIRUBINUR LARGE*  KETONESUR >80*  PROTEINUR 30*  UROBILINOGEN 1.0  NITRITE NEGATIVE  LEUKOCYTESUR NEGATIVE   Lipid Panel    Component Value Date/Time   CHOL 275* 02/02/2013 0420   TRIG 171* 02/02/2013 0420   HDL 25* 02/02/2013 0420   CHOLHDL 11.0 02/02/2013 0420   VLDL 34 02/02/2013 0420   LDLCALC 216* 02/02/2013 0420    HgbA1C  Lab Results  Component Value Date   HGBA1C 8.8* 02/03/2013    Urine Drug Screen:     Component Value Date/Time   LABOPIA NONE DETECTED 02/01/2013 1452   COCAINSCRNUR NONE DETECTED 02/01/2013 1452   LABBENZ NONE DETECTED 02/01/2013 1452   AMPHETMU NONE DETECTED 02/01/2013 1452   THCU NONE DETECTED 02/01/2013 1452   LABBARB NONE DETECTED 02/01/2013 1452    Alcohol Level:   Recent Labs Lab 02/01/13 1351  ETH <11   CT of the brain  02/01/2013 1.  Area of decreased attenuation in the right occipital / posterior right parietal lobe best seen in axial image 17 and 16. This is suspicious for evolving ischemia.  Further evaluation with brain MRI with diffusion imaging is recommended. 2.  No intracranial hemorrhage, mass effect or midline shift.  Mild cerebral atrophy.   MRI of the brain  02/01/2013 1.  Confluent acute infarct in the right PCA territory with occipital lobe mild cytotoxic edema.  However, there are also numerous small acute infarcts in the left ACA, left PCA, and bilateral MCA territory.  Constellation of findings is therefore most suggestive of acute embolic event. 2.  No mass effect or hemorrhage.   MRA of the brain  02/01/2013   1.  Occlusion or near occlusion of the right PCA in the distal P2/proximal P3 segment. 2. Nondominant distal left vertebral artery, might be occluded in the neck and reconstituted from the right vertebral artery in the posterior fossa.  Still, the absence of acute MRI changes in the posterior fossa suggest this is not of acute clinical significance. 3.  Intracranial atherosclerosis but no other major circle Willis branch occlusion or hemodynamically significant intracranial stenosis.  2D Echocardiogram  EF 60-65% with no source of embolus.   TEE    Carotid Doppler  No evidence of hemodynamically significant internal carotid artery stenosis. Vertebral artery flow is antegrade.   CXR  02/01/2013  Possible mild right upper and lower lobe opacity,  aspiration or pneumonia not excluded.    EKG  normal sinus rhythm, PAC's noted.   Therapy Recommendations SNF  Physical Exam   Gen: In bed, NAD  MS: Awake, Alert, oriented to hospital, but not  To . Severe left neglect.  ZO:XWRUE, right gaze preference, but will track with smooth pursuit across  midline.  Motor: 5/5 on right, at least 4+/5 on left, though still difficult to test due to neglect.  Sensory:continues to identify left sided stimuli as occuring on right.   ASSESSMENT Ms. [name] is a 79 y.o. female presenting with Left UE and LE weakness, found down. Imaging confirms a right PCA and left MCA and PCA infarcts. Right occipital lobe with mild cytotoxic edema.  Infarcts felt to be  embolic secondary to unknown etiology. atrial fibrillation is the most common etiology in this age group.  On  no antiplatelets prior to admission. Now on aspirin 325 mg orally every day for secondary stroke prevention. Patient with resultant left hemiparesis. Work up underway.  Diabetes, uncontrolled, HgbA1c 8.8, goal < 7.0  Hypertension Hyperlipidemia, LDL 216, on statin PTA, on statin now, goal LDL < 100 (<70 for diabetics) CAD Chronic back pain  Hospital day # 4  TREATMENT/PLAN  Continue aspirin 325 mg orally every day for secondary stroke prevention. TEE today to look for embolic source. If positive for PFO (patent foramen ovale), check bilateral lower extremity venous dopplers to rule out DVT as possible source of stroke.  OOB SNF at time of discharge  Annie Main, MSN, RN, ANVP-BC, ANP-BC, [facility] Pager: [phone] 02/05/2013 12:27 PM  I have personally obtained a history, examined the patient, evaluated imaging results, and formulated the assessment and plan of care. I agree with the above.  Delia Heady, MD

## 2013-02-05 NOTE — Progress Notes (Signed)
Patient was referred for [facility] services prior to admission by PCP.  She has been engaged telephonically.  Home Health was requested prior to admission but was not engaged before the hospitalization.  Patient will receive a post discharge transition of care call and will be evaluated for monthly home visits for assessments and disease process education. Made inpatient Case Manager Letha Cape RN aware that [facility] Care Management following. Of note, [facility] Care Management services does not replace or interfere with any services that are arranged by inpatient case management or social work.  For additional questions or referrals please contact Anibal Henderson BSN RN [facility] Liaison at [phone].

## 2013-02-05 NOTE — Progress Notes (Signed)
[facility] PROGRESS NOTE Assessment/Plan:  *CVA (cerebral infarction) ? Embolic: - Allow a degree of permisive HTN, continue  bidil, norvac, ARB and HCTZ. - Cardiac markers negative. Cont ASA. - MRI as below - TTE pending, patient does not seem to understand the procedure consulting - Psyq to evaluate to capacity. - SNF family want [facility].   Diabetes mellitus, insulin dependent (IDDM), controlled - High, low dose lantus cont SSI. - HbgA1c. > 9.0 last time.   Diastolic CHF, chronic/HYPERTENSION - lower extremity edema. - cont Avapro, HCTZ, metorpolol  HYPERCHOLESTEROLEMIA - statins  Code Status: full  Family Communication: bedside  Disposition Plan: admit    Consultants:  Neuro  Procedures: - MRI 4.18.2014: acute infarct in the right PCA territory with occipital lobe mild cytotoxic edema. However, there are also numerous small acute infarcts in the left ACA, left PCA, and bilateral MCA territory. - MRA: Occlusion or near occlusion of the right PCA in the distal P2/proximal P3 segment - Carotid doppler 4.19.2014: no obvious evidence of hemodynamically significant internal carotid artery stenosis >40% bilaterally. Vertebral arteries are patent with antegrade flow - Echo 4.19.2014: estimated ejection fraction was in the range of 60% to 65   Antibiotics:  none   HPI/Subjective: Has no complains I think she cannot understand what it's explain to her.  Objective: Filed Vitals:   02/04/13 1959 02/04/13 2110 02/05/13 0023 02/05/13 0412  BP: 170/90 173/105 179/81 168/77  Pulse: 97 82 90 79  Temp: 99.3 F (37.4 C)  98.9 F (37.2 C) 99.1 F (37.3 C)  TempSrc: Oral  Axillary Axillary  Resp: 20  16 16   Height:      Weight:      SpO2: 90%  91% 92%    Intake/Output Summary (Last 24 hours) at 02/05/13 0816 Last data filed at 02/04/13 1854  Gross per 24 hour  Intake    675 ml  Output      0 ml  Net    675 ml   Filed Weights   02/02/13 1400  Weight:  74.7 kg (164 lb 10.9 oz)    Exam:  General: Alert, awake, oriented x2, in no acute distress.  HEENT: No bruits, no goiter.  Heart: Regular rate and rhythm, without murmurs, rubs, gallops.  Lungs: Good air movement, clear to auscultation.  Abdomen: Soft, nontender, nondistended, positive bowel sounds.  Neuro: answer some question no pstosis, extra-ocular movement intact, no facial asymmetry, MS intact, normal finger to one.   Data Reviewed: Basic Metabolic Panel:  Recent Labs Lab 01/29/13 1120 02/01/13 1351 02/01/13 1430 02/01/13 1651  NA 137 138 141  --   K 3.6 4.0 3.9  --   CL 100 100 105  --   CO2 26 22  --   --   GLUCOSE 242* 314* 322*  --   BUN 9 17 17   --   CREATININE 0.6 0.53 0.60 0.48*  CALCIUM 8.8 9.8  --   --    Liver Function Tests:  Recent Labs Lab 01/29/13 1120 02/01/13 1351  AST 18 41*  ALT 17 23  ALKPHOS 116 163*  BILITOT 0.6 0.7  PROT 6.6 7.8  ALBUMIN 3.5 3.6   No results found for this basename: LIPASE, AMYLASE,  in the last 168 hours No results found for this basename: AMMONIA,  in the last 168 hours CBC:  Recent Labs Lab 01/29/13 1120 02/01/13 1351 02/01/13 1430 02/01/13 1651  WBC 5.8 8.1  --  9.0  NEUTROABS 3.9 6.6  --   --  HGB 13.8 15.7* 16.3* 16.5*  HCT 40.8 45.4 48.0* 46.7*  MCV 88.5 87.0  --  85.5  PLT 168.0 183  --  184   Cardiac Enzymes:  Recent Labs Lab 02/01/13 1355 02/01/13 1651 02/01/13 2218 02/02/13 0420  CKTOTAL  --  2670*  --   --   CKMB  --  10.5*  --   --   TROPONINI <0.30 <0.30 <0.30 <0.30   BNP (last 3 results) No results found for this basename: PROBNP,  in the last 8760 hours CBG:  Recent Labs Lab 02/04/13 1633 02/04/13 1953 02/05/13 0036 02/05/13 0413 02/05/13 0751  GLUCAP 206* 202* 167* 137* 142*    Recent Results (from the past 240 hour(s))  MRSA PCR SCREENING     Status: None   Collection Time    02/01/13  4:56 PM      Result Value Range Status   MRSA by PCR NEGATIVE  NEGATIVE Final    Comment:            The GeneXpert MRSA Assay (FDA     approved for NASAL specimens     only), is one component of a     comprehensive MRSA colonization     surveillance program. It is not     intended to diagnose MRSA     infection nor to guide or     monitor treatment for     MRSA infections.     Studies: No results found.  Scheduled Meds: . amLODipine  10 mg Oral Daily  . aspirin  300 mg Rectal Daily   Or  . aspirin  325 mg Oral Daily  . atorvastatin  40 mg Oral Daily  . enoxaparin (LOVENOX) injection  40 mg Subcutaneous Q24H  . fluticasone  1 spray Each Nare QHS  . hydrochlorothiazide  25 mg Oral Daily   And  . irbesartan  300 mg Oral Daily  . insulin aspart  0-9 Units Subcutaneous Q4H  . insulin glargine  20 Units Subcutaneous QHS  . isosorbide-hydrALAZINE  1 tablet Oral BID  . metoCLOPramide  10 mg Oral Daily  . metoprolol succinate  50 mg Oral Daily   Continuous Infusions: . sodium chloride 75 mL/hr at 02/05/13 0433     Marinda Elk  [facility] Pager [phone]. If 8PM-8AM, please contact night-coverage at [email or web address], password [email or web address] 02/05/2013, 8:16 AM  LOS: 4 days

## 2013-02-05 NOTE — Consult Note (Signed)
Reason for Consult: Capacity evaluation and AMS Referring Physician: Dr. David Stall  [name] is an 79 y.o. female.  HPI: Patient was seen and the chart reviewed to get psychiatric consultation was requested for capacity evaluation as patient was unable to care for herself. Patient has history of hypertension, diabetes, hypercholesterolemia and coronary disease with a history of congestive heart failure who presents with altered mental status. Reportedly patient has no family members and her distant cousin was not in contact with her or able to provide any support. Patient was occasionally supervised by neighbors and long-term friends.  Patient was unable to respond to simple questions but she was able to tell me her first name last name unable to identify glasses and plan. Patient was unable to recall recognize where she is what her basic needs are. Patient has no understanding about had problems and what it takes to help her.   According to the paramedics friends found the patient in her recliner in her house after not having seen her or heard from her in several days. She was found in her chair, unable to get out of the chair with left-sided weakness, altered mental status and speech difficulties. She was found to have soiled her undergarments. The patient is unable to contributory history history the occasional yes or no questions only. It is unsure when these symptoms started, she has not been seen for several days prior to being seen this morning. Her baseline is ability to care for herself completely.   Mental Status Examination: Patient appeared lying down on her bed made to slightly woke and eyes and mouth . Patient has decreased psychomotor activity and limited verbal response and she uses one word response with the longer pauses. Patient has constricted affect. She has limited verbal responses.  she has a low-volume of speech. Patient stated that she does not remember cannot complete  memory testing. Patient has denied suicidal, homicidal ideations, intentions or plans. Patient has no evidence of auditory or visual hallucinations, delusions, and paranoia. Patient has No insight judgment and impulse control.  Past Medical History  Diagnosis Date  . Encounter for long-term (current) use of other medications     Anitihyperlipidemic use, Long term  . DIABETES MELLITUS, TYPE I 05/30/2007  . HYPERCHOLESTEROLEMIA 10/23/2008  . HYPERTENSION 05/30/2007  . OSTEOARTHRITIS, LUMBAR SPINE 12/25/2007  . Depression 10/29/2011  . GERD (gastroesophageal reflux disease) 10/29/2011  . Diastolic CHF, chronic   . CAD (coronary artery disease)   . Anxiety problem   . Wedge compression fracture of T11-T12 vertebra   . Lumbar degenerative disc disease   . Anemia, unspecified   . Lumbar spinal stenosis   . Allergic rhinitis, cause unspecified 10/29/2011  . Osteoporosis 10/29/2011  . Failure to thrive in childhood 10/29/2011  . Osteoarthritis of right knee 10/29/2011  . History of colon polyps 11/01/2011  . Chronic pain 11/01/2011    Past Surgical History  Procedure Laterality Date  . Abdominal hysterectomy    . Cholecystectomy    . Appendectomy    . Back surgury      Family History  Problem Relation Age of Onset  . Diabetes Neg Hx     No DM in her immdidate family    Social History:  reports that she has quit smoking. She does not have any smokeless tobacco history on file. Her alcohol and drug histories are not on file.  Allergies:  Allergies  Allergen Reactions  . Ace Inhibitors  unknown  . Baclofen     unknown  . Cefaclor     unknown  . Chlorzoxazone     unknown  . Cloxacillin     unknown  . Codeine     unknown  . Diclofenac Sodium     unknown  . Erythromycin     unknown  . Meperidine Hcl     unknown  . Nsaids     unknown  . Promethazine     unknown  . Propoxyphene-Acetaminophen     unknown  . Sulfonamide Derivatives     unknown    Medications: I have  reviewed the patient's current medications.  Results for orders placed during the hospital encounter of 02/01/13 (from the past 48 hour(s))  GLUCOSE, CAPILLARY     Status: Abnormal   Collection Time    02/03/13  4:22 PM      Result Value Range   Glucose-Capillary 228 (*) 70 - 99 mg/dL  GLUCOSE, CAPILLARY     Status: Abnormal   Collection Time    02/03/13  8:57 PM      Result Value Range   Glucose-Capillary 252 (*) 70 - 99 mg/dL  GLUCOSE, CAPILLARY     Status: Abnormal   Collection Time    02/04/13 12:03 AM      Result Value Range   Glucose-Capillary 217 (*) 70 - 99 mg/dL  GLUCOSE, CAPILLARY     Status: Abnormal   Collection Time    02/04/13  3:58 AM      Result Value Range   Glucose-Capillary 120 (*) 70 - 99 mg/dL  GLUCOSE, CAPILLARY     Status: Abnormal   Collection Time    02/04/13  7:42 AM      Result Value Range   Glucose-Capillary 223 (*) 70 - 99 mg/dL  GLUCOSE, CAPILLARY     Status: Abnormal   Collection Time    02/04/13 12:03 PM      Result Value Range   Glucose-Capillary 207 (*) 70 - 99 mg/dL  GLUCOSE, CAPILLARY     Status: Abnormal   Collection Time    02/04/13  4:33 PM      Result Value Range   Glucose-Capillary 206 (*) 70 - 99 mg/dL  GLUCOSE, CAPILLARY     Status: Abnormal   Collection Time    02/04/13  7:53 PM      Result Value Range   Glucose-Capillary 202 (*) 70 - 99 mg/dL  GLUCOSE, CAPILLARY     Status: Abnormal   Collection Time    02/05/13 12:36 AM      Result Value Range   Glucose-Capillary 167 (*) 70 - 99 mg/dL   Comment 1 Documented in Chart     Comment 2 Notify RN    GLUCOSE, CAPILLARY     Status: Abnormal   Collection Time    02/05/13  4:13 AM      Result Value Range   Glucose-Capillary 137 (*) 70 - 99 mg/dL   Comment 1 Documented in Chart     Comment 2 Notify RN    GLUCOSE, CAPILLARY     Status: Abnormal   Collection Time    02/05/13  7:51 AM      Result Value Range   Glucose-Capillary 142 (*) 70 - 99 mg/dL  GLUCOSE, CAPILLARY      Status: Abnormal   Collection Time    02/05/13 12:10 PM      Result Value Range   Glucose-Capillary 169 (*) 70 -  99 mg/dL    No results found.  Positive for anxiety, bad mood, depression, learning difficulty and sleep disturbance Blood pressure 184/80, pulse 86, temperature 98.1 F (36.7 C), temperature source Oral, resp. rate 20, height 5\' 3"  (1.6 m), weight 164 lb 10.9 oz (74.7 kg), SpO2 94.00%.   Assessment/Plan: Patient does not meet criteria for capacity to make medical decisions and living arrangements. Recommended to obtain power of attorney for medical care and living arrangements by closest family or friends if not contacted Department of Social Services. Psych social service can help with the process if needed.  Amere Iott,JANARDHAHA R. 02/05/2013, 12:14 PM

## 2013-02-06 ENCOUNTER — Encounter (HOSPITAL_COMMUNITY): Payer: Self-pay | Admitting: *Deleted

## 2013-02-06 ENCOUNTER — Ambulatory Visit: Payer: Medicare Other | Admitting: Physical Therapy

## 2013-02-06 ENCOUNTER — Encounter (HOSPITAL_COMMUNITY)
Admission: EM | Disposition: A | Discharge status: Skilled Nursing Facility | Payer: Self-pay | Source: Home / Self Care | Attending: Internal Medicine

## 2013-02-06 DIAGNOSIS — I359 Nonrheumatic aortic valve disorder, unspecified: Secondary | ICD-10-CM

## 2013-02-06 HISTORY — PX: TEE WITHOUT CARDIOVERSION: SHX5443

## 2013-02-06 LAB — GLUCOSE, CAPILLARY
Glucose-Capillary: 128 mg/dL — ABNORMAL HIGH (ref 70–99)
Glucose-Capillary: 140 mg/dL — ABNORMAL HIGH (ref 70–99)

## 2013-02-06 SURGERY — ECHOCARDIOGRAM, TRANSESOPHAGEAL
Anesthesia: Moderate Sedation

## 2013-02-06 MED ORDER — BUTAMBEN-TETRACAINE-BENZOCAINE 2-2-14 % EX AERO
INHALATION_SPRAY | CUTANEOUS | Status: DC | PRN
  Administered 2013-02-06: 2 via TOPICAL

## 2013-02-06 MED ORDER — SODIUM CHLORIDE 0.9 % IV SOLN: INTRAVENOUS | Status: DC

## 2013-02-06 MED ORDER — MIDAZOLAM HCL 5 MG/ML IJ SOLN
INTRAMUSCULAR | Status: AC
  Filled 2013-02-06: qty 2

## 2013-02-06 MED ORDER — HYDRALAZINE HCL 20 MG/ML IJ SOLN: 5.0000 mg | Freq: Four times a day (QID) | INTRAMUSCULAR | Status: DC | PRN

## 2013-02-06 MED ORDER — METOPROLOL SUCCINATE ER 100 MG PO TB24
100.0000 mg | ORAL_TABLET | Freq: Every day | ORAL | Status: DC
  Administered 2013-02-07 – 2013-02-08 (×2): 100 mg via ORAL
  Filled 2013-02-06 (×2): qty 1

## 2013-02-06 MED ORDER — MIDAZOLAM HCL 10 MG/2ML IJ SOLN
INTRAMUSCULAR | Status: DC | PRN
  Administered 2013-02-06: 2 mg via INTRAVENOUS

## 2013-02-06 NOTE — H&P (View-Only) (Signed)
[facility] PROGRESS NOTE   TTE pending, patient does not seem to understand the procedure.  Psych consulted.  She does not have capacity.  TEE Consent will require the authorization of two MDs.  (Either Dr. Pearlean Brownie or Dr. Jerral Ralph and the Cardiologist performing the TEE).   Assessment/Plan:  *CVA (cerebral infarction) -suspected Embolic CVA -TEE due today-if PFO seen-will order Lower Ext dopplers -will need better BP control-increase Toprol to 100 mg, c/w bidil, norvac, ARB and HCTZ. -c/w ASA and Statins   Diabetes mellitus, insulin dependent (IDDM), uncontrolled -CBG's stable-c/w low dose lantus cont SSI. - HbgA1c 9.1 on 4/19   Diastolic CHF, chronic/HYPERTENSION -continues to have mild lower extremity edema-but not overtly hypervolumic - cont Avapro, HCTZ, metorpolol -TTE-EF preserved  HYPERCHOLESTEROLEMIA - statins  Psychaitry -unfortunately lacks capacity-seen by psych on 4/22 -will need placement -social worker aware  Code Status: full  Family Communication: bedside  Disposition Plan: discharge to SNF hopefully on 4/24.   Consultants:  Neuro  Procedures: - MRI 4.18.2014: acute infarct in the right PCA territory with occipital lobe mild cytotoxic edema. However, there are also numerous small acute infarcts in the left ACA, left PCA, and bilateral MCA territory.  - MRA: Occlusion or near occlusion of the right PCA in the distal P2/proximal P3 segment  - Carotid doppler 4.19.2014: no obvious evidence of hemodynamically significant internal carotid artery stenosis >40% bilaterally. Vertebral arteries are patent with antegrade flow  - Echo 4.19.2014: estimated ejection fraction was in the range of 60% to 65   Antibiotics:  none   HPI/Subjective: Has no complaints.    Objective: Filed Vitals:   02/05/13 2029 02/05/13 2356 02/06/13 0420 02/06/13 0900  BP: 163/72 177/75 180/76 182/84  Pulse: 84  79 80  Temp: 98.1 F (36.7 C)  98.5 F (36.9 C) 96.4 F  (35.8 C)  TempSrc: Oral  Oral Oral  Resp: 16  16 18   Height:      Weight:      SpO2: 94%  98% 97%    Intake/Output Summary (Last 24 hours) at 02/06/13 1127 Last data filed at 02/06/13 0900  Gross per 24 hour  Intake      0 ml  Output      0 ml  Net      0 ml   Filed Weights   02/02/13 1400  Weight: 74.7 kg (164 lb 10.9 oz)    Exam:  General: Alert, awake, oriented x2, in no acute distress.  HEENT: No bruits, no goiter.  Heart: Regular rate and rhythm, without murmurs, rubs, gallops.  Lungs: Good air movement, clear to auscultation.  Abdomen: Soft, nontender, nondistended, positive bowel sounds.  Neuro: gives 1 word appropriate answers. Barely able to lightly squeeze both of my hands with hers.  Unable to move legs.     Data Reviewed: Basic Metabolic Panel:  Recent Labs Lab 02/01/13 1351 02/01/13 1430 02/01/13 1651  NA 138 141  --   K 4.0 3.9  --   CL 100 105  --   CO2 22  --   --   GLUCOSE 314* 322*  --   BUN 17 17  --   CREATININE 0.53 0.60 0.48*  CALCIUM 9.8  --   --    Liver Function Tests:  Recent Labs Lab 02/01/13 1351  AST 41*  ALT 23  ALKPHOS 163*  BILITOT 0.7  PROT 7.8  ALBUMIN 3.6   CBC:  Recent Labs Lab 02/01/13 1351 02/01/13 1430 02/01/13 1651  WBC 8.1  --  9.0  NEUTROABS 6.6  --   --   HGB 15.7* 16.3* 16.5*  HCT 45.4 48.0* 46.7*  MCV 87.0  --  85.5  PLT 183  --  184   Cardiac Enzymes:  Recent Labs Lab 02/01/13 1355 02/01/13 1651 02/01/13 2218 02/02/13 0420  CKTOTAL  --  2670*  --   --   CKMB  --  10.5*  --   --   TROPONINI <0.30 <0.30 <0.30 <0.30   CBG:  Recent Labs Lab 02/05/13 1647 02/05/13 2024 02/06/13 0004 02/06/13 0418 02/06/13 0756  GLUCAP 158* 139* 140* 128* 137*    Recent Results (from the past 240 hour(s))  MRSA PCR SCREENING     Status: None   Collection Time    02/01/13  4:56 PM      Result Value Range Status   MRSA by PCR NEGATIVE  NEGATIVE Final   Comment:            The GeneXpert MRSA  Assay (FDA     approved for NASAL specimens     only), is one component of a     comprehensive MRSA colonization     surveillance program. It is not     intended to diagnose MRSA     infection nor to guide or     monitor treatment for     MRSA infections.     Studies: No results found.  Scheduled Meds: . amLODipine  10 mg Oral Daily  . aspirin  300 mg Rectal Daily   Or  . aspirin  325 mg Oral Daily  . atorvastatin  40 mg Oral Daily  . enoxaparin (LOVENOX) injection  40 mg Subcutaneous Q24H  . fluticasone  1 spray Each Nare QHS  . hydrochlorothiazide  25 mg Oral Daily   And  . irbesartan  300 mg Oral Daily  . insulin aspart  0-9 Units Subcutaneous Q4H  . insulin glargine  20 Units Subcutaneous QHS  . isosorbide-hydrALAZINE  2 tablet Oral BID  . metoCLOPramide  10 mg Oral Daily  . metoprolol succinate  50 mg Oral Daily   Continuous Infusions: . sodium chloride 75 mL/hr at 02/06/13 1610     Conley Canal  [facility] Pager [phone]. If 8PM-8AM, please contact night-coverage at [email or web address], password [email or web address] 02/06/2013, 11:27 AM  LOS: 5 days

## 2013-02-06 NOTE — Interval H&P Note (Signed)
History and Physical Interval Note:  02/06/2013 2:43 PM  [name]  has presented today for surgery, with the diagnosis of rule out PFO  The various methods of treatment have been discussed with the patient and family. After consideration of risks, benefits and other options for treatment, the patient has consented to  Procedure(s): TRANSESOPHAGEAL ECHOCARDIOGRAM (TEE) (N/A) as a surgical intervention .  The patient's history has been reviewed, patient examined, no change in status, stable for surgery.  I have reviewed the patient's chart and labs.  Questions were answered to the patient's satisfaction.     Elyn Aquas.

## 2013-02-06 NOTE — Clinical Social Work Note (Addendum)
Patient's close friends Donnie and Levy Sjogren are in agreement (as noted in psych assessment) with short-term rehab for patient. Clinicals sent out to [place] and [place] counties.   Genelle Bal, MSW, LCSW [phone]

## 2013-02-06 NOTE — Progress Notes (Signed)
[facility] PROGRESS NOTE   TTE pending, patient does not seem to understand the procedure.  Psych consulted.  She does not have capacity.  TEE Consent will require the authorization of two MDs.  (Either Dr. Sethi or Dr. Ghimire and the Cardiologist performing the TEE).   Assessment/Plan:  *CVA (cerebral infarction) -suspected Embolic CVA -TEE due today-if PFO seen-will order Lower Ext dopplers -will need better BP control-increase Toprol to 100 mg, c/w bidil, norvac, ARB and HCTZ. -c/w ASA and Statins   Diabetes mellitus, insulin dependent (IDDM), uncontrolled -CBG's stable-c/w low dose lantus cont SSI. - HbgA1c 9.1 on 4/19   Diastolic CHF, chronic/HYPERTENSION -continues to have mild lower extremity edema-but not overtly hypervolumic - cont Avapro, HCTZ, metorpolol -TTE-EF preserved  HYPERCHOLESTEROLEMIA - statins  Psychaitry -unfortunately lacks capacity-seen by psych on 4/22 -will need placement -social worker aware  Code Status: full  Family Communication: bedside  Disposition Plan: discharge to SNF hopefully on 4/24.   Consultants:  Neuro  Procedures: - MRI 4.18.2014: acute infarct in the right PCA territory with occipital lobe mild cytotoxic edema. However, there are also numerous small acute infarcts in the left ACA, left PCA, and bilateral MCA territory.  - MRA: Occlusion or near occlusion of the right PCA in the distal P2/proximal P3 segment  - Carotid doppler 4.19.2014: no obvious evidence of hemodynamically significant internal carotid artery stenosis >40% bilaterally. Vertebral arteries are patent with antegrade flow  - Echo 4.19.2014: estimated ejection fraction was in the range of 60% to 65   Antibiotics:  none   HPI/Subjective: Has no complaints.    Objective: Filed Vitals:   02/05/13 2029 02/05/13 2356 02/06/13 0420 02/06/13 0900  BP: 163/72 177/75 180/76 182/84  Pulse: 84  79 80  Temp: 98.1 F (36.7 C)  98.5 F (36.9 C) 96.4 F  (35.8 C)  TempSrc: Oral  Oral Oral  Resp: 16  16 18  Height:      Weight:      SpO2: 94%  98% 97%    Intake/Output Summary (Last 24 hours) at 02/06/13 1127 Last data filed at 02/06/13 0900  Gross per 24 hour  Intake      0 ml  Output      0 ml  Net      0 ml   Filed Weights   02/02/13 1400  Weight: 74.7 kg (164 lb 10.9 oz)    Exam:  General: Alert, awake, oriented x2, in no acute distress.  HEENT: No bruits, no goiter.  Heart: Regular rate and rhythm, without murmurs, rubs, gallops.  Lungs: Good air movement, clear to auscultation.  Abdomen: Soft, nontender, nondistended, positive bowel sounds.  Neuro: gives 1 word appropriate answers. Barely able to lightly squeeze both of my hands with hers.  Unable to move legs.     Data Reviewed: Basic Metabolic Panel:  Recent Labs Lab 02/01/13 1351 02/01/13 1430 02/01/13 1651  NA 138 141  --   K 4.0 3.9  --   CL 100 105  --   CO2 22  --   --   GLUCOSE 314* 322*  --   BUN 17 17  --   CREATININE 0.53 0.60 0.48*  CALCIUM 9.8  --   --    Liver Function Tests:  Recent Labs Lab 02/01/13 1351  AST 41*  ALT 23  ALKPHOS 163*  BILITOT 0.7  PROT 7.8  ALBUMIN 3.6   CBC:  Recent Labs Lab 02/01/13 1351 02/01/13 1430 02/01/13 1651    WBC 8.1  --  9.0  NEUTROABS 6.6  --   --   HGB 15.7* 16.3* 16.5*  HCT 45.4 48.0* 46.7*  MCV 87.0  --  85.5  PLT 183  --  184   Cardiac Enzymes:  Recent Labs Lab 02/01/13 1355 02/01/13 1651 02/01/13 2218 02/02/13 0420  CKTOTAL  --  2670*  --   --   CKMB  --  10.5*  --   --   TROPONINI <0.30 <0.30 <0.30 <0.30   CBG:  Recent Labs Lab 02/05/13 1647 02/05/13 2024 02/06/13 0004 02/06/13 0418 02/06/13 0756  GLUCAP 158* 139* 140* 128* 137*    Recent Results (from the past 240 hour(s))  MRSA PCR SCREENING     Status: None   Collection Time    02/01/13  4:56 PM      Result Value Range Status   MRSA by PCR NEGATIVE  NEGATIVE Final   Comment:            The GeneXpert MRSA  Assay (FDA     approved for NASAL specimens     only), is one component of a     comprehensive MRSA colonization     surveillance program. It is not     intended to diagnose MRSA     infection nor to guide or     monitor treatment for     MRSA infections.     Studies: No results found.  Scheduled Meds: . amLODipine  10 mg Oral Daily  . aspirin  300 mg Rectal Daily   Or  . aspirin  325 mg Oral Daily  . atorvastatin  40 mg Oral Daily  . enoxaparin (LOVENOX) injection  40 mg Subcutaneous Q24H  . fluticasone  1 spray Each Nare QHS  . hydrochlorothiazide  25 mg Oral Daily   And  . irbesartan  300 mg Oral Daily  . insulin aspart  0-9 Units Subcutaneous Q4H  . insulin glargine  20 Units Subcutaneous QHS  . isosorbide-hydrALAZINE  2 tablet Oral BID  . metoCLOPramide  10 mg Oral Daily  . metoprolol succinate  50 mg Oral Daily   Continuous Infusions: . sodium chloride 75 mL/hr at 02/06/13 0658     York, Marianne L, PA-C  [facility] Pager [phone]. If 8PM-8AM, please contact night-coverage at [email or web address], password [email or web address] 02/06/2013, 11:27 AM  LOS: 5 days        

## 2013-02-06 NOTE — Progress Notes (Signed)
I have reviewed and agree with this note. Ignacia Palma, [place] [phone] 02/06/2013

## 2013-02-06 NOTE — Progress Notes (Signed)
Spoke to doctor about b/p med- RN to call if b/p starts pushing 190's.

## 2013-02-06 NOTE — Progress Notes (Signed)
Pt refuses b/p meds. RN tried many times to give med. Education was given-pt still refused.

## 2013-02-06 NOTE — Progress Notes (Signed)
*  PRELIMINARY RESULTS* Echocardiogram TEE has been performed.  Jeryl Columbia 02/06/2013, 3:08 PM

## 2013-02-06 NOTE — Progress Notes (Signed)
Occupational Therapy Treatment Patient Details Name: [name] MRN: [id number] DOB: 04-16-1934 Today's Date: 02/06/2013 Time: 1110-1145 OT Time Calculation (min): 35 min  OT Assessment / Plan / Recommendation Comments on Treatment Session Pt has progressed.  Pt still needs +2 A with most tasks.  Pt was able to converse more than last session and maintain her balance more at EOB.    Follow Up Recommendations  SNF       Equipment Recommendations  None recommended by OT       Frequency Min 2X/week   Plan Discharge plan remains appropriate    Precautions / Restrictions Precautions Precautions: Fall Precaution Comments: L neglect Restrictions Weight Bearing Restrictions: No Other Position/Activity Restrictions: Cast on L forearm due to suspected scaphoid       ADL  Toilet Transfer: +2 Total assistance Toilet Transfer: Patient Percentage: 20% Toilet Transfer Method: Sit to stand (with use of Bari-Stedy) Acupuncturist: Other (comment) (bed>recliner with Geralyn Corwin) Toileting - Clothing Manipulation and Hygiene: Simulated;+1 Total assistance Where Assessed - Engineer, mining and Hygiene: Standing Equipment Used: Gait belt;Other (comment) (steady) Transfers/Ambulation Related to ADLs: +2 total A with Pt doing 20% for sit stand and stand to sit using Geralyn Corwin ADL Comments: focused on transfers, bed mobility and head/eyes turning to the left      OT Goals ADL Goals ADL Goal: Additional Goal #1 - Progress: Not progressing (Same as eval)  Visit Information  Last OT Received On: 02/06/13 Assistance Needed: +2 PT/OT Co-Evaluation/Treatment: Yes    Subjective Data  Subjective: I don't know(when asked if her neck was tight)      Cognition  Cognition Arousal/Alertness: Lethargic Behavior During Therapy: Flat affect Overall Cognitive Status: Impaired/Different from baseline Area of Impairment: Following commands;Safety/judgement;Problem  solving;Awareness Following Commands: Follows one step commands inconsistently;Follows one step commands with increased time Safety/Judgement: Decreased awareness of safety;Decreased awareness of deficits Problem Solving: Slow processing;Decreased initiation;Difficulty sequencing;Requires tactile cues;Requires verbal cues General Comments: increased time to respond to all questions; attempted to follow some commands; unaware of errors/safety/judgement; unable to problem solve    Mobility  Bed Mobility Bed Mobility: Rolling Left;Left Sidelying to Sit;Sitting - Scoot to Edge of Bed Rolling Left: 1: +1 Total assist Left Sidelying to Sit: 1: +2 Total assist;With rails;HOB elevated Left Sidelying to Sit: Patient Percentage: 20% Sitting - Scoot to Edge of Bed:  (0%) Details for Bed Mobility Assistance: Pt requires total cueing for sequencing. total A to reach for left bed rail with right arm, once her hand was the on the rail, she was able hold onto it by herself (? reflex).  Transfers Transfers: Sit to Stand;Stand to Sit Sit to Stand: 1: +2 Total assist;From bed;With upper extremity assist Sit to Stand: Patient Percentage: 20% Stand to Sit: 1: +2 Total assist;With upper extremity assist;To chair/3-in-1 Stand to Sit: Patient Percentage: 20% Transfer via Lift Equipment: Stedy Details for Transfer Assistance: Pt was able to stand with total A +2 (20%) with LUE on bar of Stedy, use of pad under her, and tactile cues on her ischiums. She was able to maintain standing for 10 seconds.       Balance Balance Balance Assessed: Yes Static Sitting Balance Static Sitting - Balance Support: Feet supported;No upper extremity supported Static Sitting - Level of Assistance: Other (comment);4: Min assist (min A then progressed to supervision) Static Sitting - Comment/# of Minutes: Sat EOB for a 5 mintues.   End of Session OT - End of Session Equipment Utilized  During Treatment: Gait belt Activity  Tolerance: Patient tolerated treatment well Patient left: in chair Nurse Communication: Mobility status;Need for lift equipment       Dietrich Pates 02/06/2013, 12:56 PM

## 2013-02-06 NOTE — CV Procedure (Signed)
    Transesophageal Echocardiogram Note  DEVYNNE STURDIVANT [id number] 12/13/1933  Procedure: Transesophageal Echocardiogram Indications: CVA  Procedure Details Consent: Obtained Time Out: Verified patient identification, verified procedure, site/side was marked, verified correct patient position, special equipment/implants available, Radiology Safety Procedures followed,  medications/allergies/relevent history reviewed, required imaging and test results available.  Performed  Medications: Fentanyl:  Versed: 2 mg IV  Left Ventrical:  Normal LV function  Mitral Valve: trace MR  Aortic Valve: trace - mild AI  Tricuspid Valve: normal  Pulmonic Valve: normal  Left Atrium/ Left atrial appendage: no thrombus  Atrial septum: no PFO or ASD  Aorta: mild calcification    Complications: No apparent complications Patient did tolerate procedure well.   Vesta Mixer, Montez Hageman., MD, [facility] 02/06/2013, 2:56 PM

## 2013-02-06 NOTE — Progress Notes (Signed)
Physical Therapy Treatment Patient Details Name: [name] MRN: [id number] DOB: 1934-07-14 Today's Date: 02/06/2013 Time: 1110-1145 PT Time Calculation (min): 35 min  PT Assessment / Plan / Recommendation Comments on Treatment Session  Pt left in Right sidelying with pillow under LUE and bolster at the R side of her face to hold pt's head/neck in neutral position.  She was able to achieve nuetoal head rotation and even about 30 degrees of rotation to left in sitting  supported postition in recliner chair.  Question if the flexed position helped break up her abnormal tone patterns. Pt was able to parrticipate some in session today.    Follow Up Recommendations  SNF     Does the patient have the potential to tolerate intense rehabilitation     Barriers to Discharge        Equipment Recommendations  None recommended by PT    Recommendations for Other Services    Frequency Min 3X/week   Plan Frequency remains appropriate;Discharge plan remains appropriate    Precautions / Restrictions Precautions Precautions: Fall Precaution Comments: L neglect Restrictions Weight Bearing Restrictions: No Other Position/Activity Restrictions: Cast on L forearm due to suspected scaphoid   Pertinent Vitals/Pain Pt indicates pain in right side of neck    Mobility  Bed Mobility Bed Mobility: Rolling Left;Sit to Sidelying Left;Scooting to [facility] Rolling Left: 1: +1 Total assist Rolling Left: Patient Percentage: 20% (pt had both legs bent up and was able to assit with them ) Left Sidelying to Sit: 1: +2 Total assist;With rails;HOB elevated Left Sidelying to Sit: Patient Percentage: 20% Sitting - Scoot to Edge of Bed:  (0%) Details for Bed Mobility Assistance: Pt requires total cueing for sequencing. total A to reach for left bed rail with right arm, once her hand was the on the rail, she was able hold onto it by herself (? reflex).  Transfers Transfers: Sit to Stand;Stand to Sit Sit to Stand:  1: +2 Total assist;From bed;With upper extremity assist Sit to Stand: Patient Percentage: 20% Stand to Sit: 1: +2 Total assist;With upper extremity assist;To chair/3-in-1 Stand to Sit: Patient Percentage: 20% Transfer via Lift Equipment: Stedy Details for Transfer Assistance: Pt was able to stand with total A +2 (20%) with LUE on bar of Stedy, use of pad under her, and tactile cues on her ischiums. She was able to maintain standing for 10 seconds. Pt maintained head and eyes turned to right Ambulation/Gait Ambulation/Gait Assistance: Not tested (comment)    Exercises General Exercises - Lower Extremity Ankle Circles/Pumps: Both;5 reps;Supine Hip ABduction/ADduction: Both;10 reps;Supine Hip Flexion/Marching: PROM;Both;10 reps (pt able to maintain both legs in hook lying position)   PT Diagnosis:    PT Problem List:   PT Treatment Interventions:     PT Goals Acute Rehab PT Goals PT Goal Formulation: With patient Time For Goal Achievement: 02/16/13 Potential to Achieve Goals: Fair Pt will Roll Supine to Left Side: with min assist;with rail PT Goal: Rolling Supine to Left Side - Progress: Progressing toward goal Pt will go Supine/Side to Sit: with mod assist;with rail PT Goal: Supine/Side to Sit - Progress: Progressing toward goal Pt will Sit at Edge of Bed: with modified independence;6-10 min;Other (comment) PT Goal: Sit at [facility] Of Bed - Progress: Progressing toward goal Pt will go Sit to Supine/Side: with mod assist Pt will go Sit to Stand: with mod assist PT Goal: Sit to Stand - Progress: Progressing toward goal Pt will go Stand to Sit: with mod assist PT  Goal: Stand to Sit - Progress: Progressing toward goal PT Transfer Goal: Bed to Chair/Chair to Bed - Progress: Progressing toward goal  Visit Information  Last PT Received On: 02/06/13 Assistance Needed: +2 PT/OT Co-Evaluation/Treatment: Yes    Subjective Data  Subjective: "hi" Patient Stated Goal: None stated    Cognition  Cognition Arousal/Alertness: Lethargic Behavior During Therapy: Flat affect Overall Cognitive Status: Impaired/Different from baseline Area of Impairment: Following commands;Safety/judgement;Problem solving;Awareness Following Commands: Follows one step commands inconsistently;Follows one step commands with increased time Safety/Judgement: Decreased awareness of safety;Decreased awareness of deficits Problem Solving: Slow processing;Decreased initiation;Difficulty sequencing;Requires tactile cues;Requires verbal cues General Comments: increased time to respond to all questions; attempted to follow some commands; unaware of errors/safety/judgement; unable to problem solve    Balance  Balance Balance Assessed: Yes Static Sitting Balance Static Sitting - Balance Support: Feet supported;No upper extremity supported Static Sitting - Level of Assistance: Other (comment);4: Min assist (min A then progressed to supervision) Static Sitting - Comment/# of Minutes: attempted some wieight bearing through right arm., but pt was not able to achieve it.  Pt needed multipmodal cues at sternum to try to extend upper trunk and at head to try to achieve nuetral head position.  End of Session PT - End of Session Equipment Utilized During Treatment: Gait belt Activity Tolerance: Patient limited by fatigue Patient left: with call bell/phone within reach;with bed alarm set;in chair;Other (comment) (maximove pad in place) Nurse Communication: Mobility status;Need for lift equipment   GP    Bayard Hugger. [place], [place] [phone] 02/06/2013, 3:12 PM

## 2013-02-06 NOTE — Progress Notes (Signed)
Speech Language Pathology Treatment Patient Details Name: [name] MRN: [id number] DOB: 03-27-1934 Today's Date: 02/06/2013 Time: 1191-4782 SLP Time Calculation (min): 24 min  Assessment / Plan / Recommendation Clinical Impression  Skilled treatment of cognitive function provided today for pt. with right CVA.  Pt. lethargic, arousable with max verbal/visual/tactile cues throughout session.  Significant left sided neglect with tactile and verbal assist to tilt head and turn to her left side.  Pt. sustained attention to SLP for approximately 10-15 seconds. Max visual assist provided for orientation using calander and max assist to state current place and situation.  Pt. able to accurately state reason for admission after a 7 minute delay.  Recommend continued ST at [facility] and next venue of care.      SLP Plan  Continue with current plan of care    Pertinent Vitals/Pain none  SLP Goals  SLP Goals Potential to Achieve Goals: Fair SLP Goal #1: Maintain LOA and sustained attention for 15 minutes to complete target  tasks with combination of moderate  verbal, tactile, and visual cues SLP Goal #1 - Progress: Progressing toward goal SLP Goal #2: Answer basic orientation and personal information questions with moderate verbal cues to refer to external memory aids SLP Goal #2 - Progress: Progressing toward goal SLP Goal #3: Demonstrate intellectual awareness of current physical impairments by correctly stating deficits with moderate verbal cues  SLP Goal #3 - Progress: Progressing toward goal SLP Goal #4: Correctly state 3 reasons to use call light and reason to ask for assistance with moderate verbal cues.  SLP Goal #4 - Progress: Progressing toward goal  General Temperature Spikes Noted: No Respiratory Status: Supplemental O2 delivered via (comment) Behavior/Cognition: Lethargic;Cooperative;Requires cueing;Decreased sustained attention Oral Cavity - Dentition: Dentures, top;Dentures,  bottom Patient Positioning: Upright in bed  Oral Cavity - Oral Hygiene Does patient have any of the following "at risk" factors?: Saliva - thick, dry mouth;Lips - dry, cracked Brush patient's teeth BID with toothbrush (using toothpaste with fluoride): Yes Patient is HIGH RISK - Oral Care Protocol followed (see row info): Yes   Treatment Treatment focused on: Cognition Skilled Treatment: facilitation of environmental cues        Royce Macadamia M.Ed [employer] [phone]  02/06/2013

## 2013-02-06 NOTE — Progress Notes (Signed)
Stroke Team Progress Note  HISTORY Who was brought to her PCP on 01/29/13 by "EMS per pt request as she lives alone, no family, and hard to stand or walk with worsening back pain, balance problem and 3 falls in the past month, last one yesterday with large painful bruise to left wrist. Has not been able to get her chronic pain med recently as she had no transport to come pick up the rx personally as per office policy. Has been out of most meds includiing the benicar HCT for at least 2 wks, with worsening LE edema below knees, better in the AM." Patient has no further records on file. Her friends came over and patient was not answering her door. Due to concern they opened the door and found her in her recliner, in urine and not moving. Patient was brought to ED and found to have Left UE and LE weakness. CT head showed an area of decreased attenuation in the right occipital / posterior right parietal lobe this is suspicious for evolving ischemia. Neurology was consulted to see patient for worrisome infarct. Patient admits to missing her ASA over last few days. Patient was not a TPA candidate secondary to unknown time last seem well. She was admitted for further evaluation and treatment.  SUBJECTIVE Nursing staff at bedside. TEE not performed yesterday due to consent issues.  OBJECTIVE Most recent Vital Signs: Filed Vitals:   02/05/13 2029 02/05/13 2356 02/06/13 0420 02/06/13 0900  BP: 163/72 177/75 180/76 182/84  Pulse: 84  79 80  Temp: 98.1 F (36.7 C)  98.5 F (36.9 C) 96.4 F (35.8 C)  TempSrc: Oral  Oral Oral  Resp: 16  16 18   Height:      Weight:      SpO2: 94%  98% 97%   CBG (last 3)   Recent Labs  02/06/13 0004 02/06/13 0418 02/06/13 0756  GLUCAP 140* 128* 137*   IV Fluid Intake:   . sodium chloride 75 mL/hr at 02/06/13 0658    MEDICATIONS  . amLODipine  10 mg Oral Daily  . aspirin  300 mg Rectal Daily   Or  . aspirin  325 mg Oral Daily  . atorvastatin  40 mg Oral Daily   . enoxaparin (LOVENOX) injection  40 mg Subcutaneous Q24H  . fluticasone  1 spray Each Nare QHS  . hydrochlorothiazide  12.5 mg Oral Daily  . hydrochlorothiazide  25 mg Oral Daily   And  . irbesartan  300 mg Oral Daily  . insulin aspart  0-9 Units Subcutaneous Q4H  . insulin glargine  20 Units Subcutaneous QHS  . isosorbide-hydrALAZINE  2 tablet Oral BID  . metoCLOPramide  10 mg Oral Daily  . metoprolol succinate  50 mg Oral Daily   PRN:  clonazePAM, hydrALAZINE, labetalol, senna-docusate  Diet:  NPO  Activity:  Bedrest DVT Prophylaxis:  Lovenox 40 mg sq daily   CLINICALLY SIGNIFICANT STUDIES Basic Metabolic Panel:   Recent Labs Lab 02/01/13 1351 02/01/13 1430 02/01/13 1651  NA 138 141  --   K 4.0 3.9  --   CL 100 105  --   CO2 22  --   --   GLUCOSE 314* 322*  --   BUN 17 17  --   CREATININE 0.53 0.60 0.48*  CALCIUM 9.8  --   --    Liver Function Tests:   Recent Labs Lab 02/01/13 1351  AST 41*  ALT 23  ALKPHOS 163*  BILITOT 0.7  PROT  7.8  ALBUMIN 3.6   CBC:   Recent Labs Lab 02/01/13 1351 02/01/13 1430 02/01/13 1651  WBC 8.1  --  9.0  NEUTROABS 6.6  --   --   HGB 15.7* 16.3* 16.5*  HCT 45.4 48.0* 46.7*  MCV 87.0  --  85.5  PLT 183  --  184   Coagulation:   Recent Labs Lab 02/01/13 1351  LABPROT 14.2  INR 1.11   Cardiac Enzymes:   Recent Labs Lab 02/01/13 1651 02/01/13 2218 02/02/13 0420  CKTOTAL 2670*  --   --   CKMB 10.5*  --   --   TROPONINI <0.30 <0.30 <0.30   Urinalysis:   Recent Labs Lab 02/01/13 1452  COLORURINE YELLOW  LABSPEC 1.031*  PHURINE 5.5  GLUCOSEU >1000*  HGBUR NEGATIVE  BILIRUBINUR LARGE*  KETONESUR >80*  PROTEINUR 30*  UROBILINOGEN 1.0  NITRITE NEGATIVE  LEUKOCYTESUR NEGATIVE   Lipid Panel    Component Value Date/Time   CHOL 275* 02/02/2013 0420   TRIG 171* 02/02/2013 0420   HDL 25* 02/02/2013 0420   CHOLHDL 11.0 02/02/2013 0420   VLDL 34 02/02/2013 0420   LDLCALC 216* 02/02/2013 0420   HgbA1C   Lab Results  Component Value Date   HGBA1C 8.8* 02/03/2013    Urine Drug Screen:     Component Value Date/Time   LABOPIA NONE DETECTED 02/01/2013 1452   COCAINSCRNUR NONE DETECTED 02/01/2013 1452   LABBENZ NONE DETECTED 02/01/2013 1452   AMPHETMU NONE DETECTED 02/01/2013 1452   THCU NONE DETECTED 02/01/2013 1452   LABBARB NONE DETECTED 02/01/2013 1452    Alcohol Level:   Recent Labs Lab 02/01/13 1351  ETH <11   CT of the brain  02/01/2013 1.  Area of decreased attenuation in the right occipital / posterior right parietal lobe best seen in axial image 17 and 16. This is suspicious for evolving ischemia.  Further evaluation with brain MRI with diffusion imaging is recommended. 2.  No intracranial hemorrhage, mass effect or midline shift.  Mild cerebral atrophy.   MRI of the brain  02/01/2013 1.  Confluent acute infarct in the right PCA territory with occipital lobe mild cytotoxic edema.  However, there are also numerous small acute infarcts in the left ACA, left PCA, and bilateral MCA territory.  Constellation of findings is therefore most suggestive of acute embolic event. 2.  No mass effect or hemorrhage.   MRA of the brain  02/01/2013   1.  Occlusion or near occlusion of the right PCA in the distal P2/proximal P3 segment. 2. Nondominant distal left vertebral artery, might be occluded in the neck and reconstituted from the right vertebral artery in the posterior fossa.  Still, the absence of acute MRI changes in the posterior fossa suggest this is not of acute clinical significance. 3.  Intracranial atherosclerosis but no other major circle Willis branch occlusion or hemodynamically significant intracranial stenosis.  2D Echocardiogram  EF 60-65% with no source of embolus.   TEE    Carotid Doppler  No evidence of hemodynamically significant internal carotid artery stenosis. Vertebral artery flow is antegrade.   CXR  02/01/2013  Possible mild right upper and lower lobe opacity, aspiration or  pneumonia not excluded.    EKG  normal sinus rhythm, PAC's noted.   Therapy Recommendations SNF  Physical Exam   Gen: In bed, NAD  MS: Awake, Alert, oriented to hospital, but not  To [place]. Severe left neglect.  ZO:XWRUE, right gaze preference, but will track with  smooth pursuit across midline.  Motor: 5/5 on right, at least 4+/5 on left, though still difficult to test due to neglect.  Sensory:continues to identify left sided stimuli as occuring on right.   ASSESSMENT Ms. CHEZNEY HUETHER is a 79 y.o. female presenting with Left UE and LE weakness, found down. Imaging confirms a right PCA and left MCA and PCA infarcts. Right occipital lobe with mild cytotoxic edema.  Infarcts felt to be  embolic secondary to unknown etiology. atrial fibrillation is the most common etiology in this age group.  On  no antiplatelets prior to admission. Now on aspirin 325 mg orally every day for secondary stroke prevention. Patient with resultant left hemiparesis. Await TEE.  Diabetes, uncontrolled, HgbA1c 8.8, goal < 7.0  Hypertension Hyperlipidemia, LDL 216, on statin PTA, on statin now, goal LDL < 100 (<70 for diabetics) CAD Chronic back pain  Hospital day # 5  TREATMENT/PLAN  Continue aspirin 325 mg orally every day for secondary stroke prevention. TEE  to look for embolic source. If positive for PFO (patent foramen ovale), check bilateral lower extremity venous dopplers to rule out DVT as possible source of stroke. (confirmed with Dr. Jerral Ralph) SNF at time of discharge  Annie Main, MSN, RN, ANVP-BC, ANP-BC, [facility] Pager: [phone] 02/06/2013 10:15 AM  I have personally obtained a history, examined the patient, evaluated imaging results, and formulated the assessment and plan of care. I agree with the above. Delia Heady, MD

## 2013-02-07 ENCOUNTER — Encounter (HOSPITAL_COMMUNITY): Payer: Self-pay | Admitting: Cardiovascular Disease

## 2013-02-07 DIAGNOSIS — E876 Hypokalemia: Secondary | ICD-10-CM | POA: Diagnosis present

## 2013-02-07 LAB — BASIC METABOLIC PANEL
BUN: 16 mg/dL (ref 6–23)
Calcium: 9 mg/dL (ref 8.4–10.5)
GFR calc non Af Amer: 88 mL/min — ABNORMAL LOW (ref >90)
Glucose, Bld: 134 mg/dL — ABNORMAL HIGH (ref 70–99)

## 2013-02-07 LAB — GLUCOSE, CAPILLARY
Glucose-Capillary: 192 mg/dL — ABNORMAL HIGH (ref 70–99)
Glucose-Capillary: 185 mg/dL — ABNORMAL HIGH (ref 70–99)
Glucose-Capillary: 201 mg/dL — ABNORMAL HIGH (ref 70–99)

## 2013-02-07 MED ORDER — SENNOSIDES-DOCUSATE SODIUM 8.6-50 MG PO TABS: 1.0000 | ORAL_TABLET | Freq: Every evening | ORAL | Status: DC | PRN

## 2013-02-07 MED ORDER — CLONAZEPAM 0.5 MG PO TABS: 0.2500 mg | ORAL_TABLET | Freq: Every evening | ORAL | Status: DC | PRN

## 2013-02-07 MED ORDER — INSULIN GLARGINE 100 UNIT/ML ~~LOC~~ SOLN: 20.0000 [IU] | Freq: Every day | SUBCUTANEOUS | Status: DC

## 2013-02-07 MED ORDER — METOPROLOL SUCCINATE ER 50 MG PO TB24: 100.0000 mg | ORAL_TABLET | Freq: Every day | ORAL | Status: DC

## 2013-02-07 MED ORDER — POTASSIUM CHLORIDE 10 MEQ/100ML IV SOLN
10.0000 meq | INTRAVENOUS | Status: AC
  Administered 2013-02-07 (×4): 10 meq via INTRAVENOUS
  Filled 2013-02-07 (×4): qty 100

## 2013-02-07 MED ORDER — HYDROCODONE-ACETAMINOPHEN 5-325 MG PO TABS: 1.0000 | ORAL_TABLET | Freq: Four times a day (QID) | ORAL | Status: DC | PRN

## 2013-02-07 MED ORDER — IRBESARTAN 300 MG PO TABS: 300.0000 mg | ORAL_TABLET | Freq: Every day | ORAL | Status: DC

## 2013-02-07 MED ORDER — AMLODIPINE BESYLATE 10 MG PO TABS: 10.0000 mg | ORAL_TABLET | Freq: Every day | ORAL | Status: DC

## 2013-02-07 MED ORDER — POTASSIUM CHLORIDE CRYS ER 20 MEQ PO TBCR
40.0000 meq | EXTENDED_RELEASE_TABLET | Freq: Two times a day (BID) | ORAL | Status: AC
  Administered 2013-02-07 (×2): 40 meq via ORAL
  Filled 2013-02-07 (×2): qty 2

## 2013-02-07 MED ORDER — ISOSORB DINITRATE-HYDRALAZINE 20-37.5 MG PO TABS: 2.0000 | ORAL_TABLET | Freq: Two times a day (BID) | ORAL | Status: DC

## 2013-02-07 MED ORDER — HYDROCHLOROTHIAZIDE 12.5 MG PO CAPS: 25.0000 mg | ORAL_CAPSULE | Freq: Every day | ORAL | Status: DC

## 2013-02-07 MED ORDER — POTASSIUM CHLORIDE ER 10 MEQ PO TBCR: 10.0000 meq | EXTENDED_RELEASE_TABLET | Freq: Two times a day (BID) | ORAL | Status: DC

## 2013-02-07 MED ORDER — INSULIN ASPART 100 UNIT/ML ~~LOC~~ SOLN: 0.0000 [IU] | SUBCUTANEOUS | Status: DC

## 2013-02-07 MED ORDER — ASPIRIN 325 MG PO TABS: 325.0000 mg | ORAL_TABLET | Freq: Every day | ORAL | Status: AC

## 2013-02-07 NOTE — Progress Notes (Signed)
CRITICAL VALUE ALERT  Critical value received:  K 2.6    Date of notification:  02/07/13  Time of notification:  0916  Critical value read back:yes  Nurse who received alert:  Delson Dulworth, RN   MD notified (1st page):  Dr. Jerral Ralph  Time of first page:  0920  MD notified (2nd page):  Time of second page:  Responding MD:  Dr. Jerral Ralph   Time MD responded:  [phone]

## 2013-02-07 NOTE — Progress Notes (Signed)
Speech Language Pathology Treatment Patient Details Name: [name] MRN: [id number] DOB: 1934-03-14 Today's Date: 02/07/2013 Time: 4098-1191 SLP Time Calculation (min): 22 min  Assessment / Plan / Recommendation Clinical Impression  Pt. seen for cognitive treatment.  She required max verbal/visual/tactile cues to initiate, problem solve and cease activity during functional ADL activities (donn glasses, brush teeth, comb hair).  Max verbal/tactile/visual cues to locate items on her left field.  Pt.'s information processing is delayed and she requires extra time to process information.  Pt. is not oriented to place or situation.  She making slow and steady progress towards goals.      SLP Plan  Continue with current plan of care    Pertinent Vitals/Pain none  SLP Goals  SLP Goals Potential to Achieve Goals: Fair SLP Goal #1: Maintain LOA and sustained attention for 15 minutes to complete target  tasks with combination of moderate  verbal, tactile, and visual cues SLP Goal #1 - Progress: Progressing toward goal SLP Goal #2: Answer basic orientation and personal information questions with moderate verbal cues to refer to external memory aids SLP Goal #2 - Progress: Progressing toward goal SLP Goal #3: Demonstrate intellectual awareness of current physical impairments by correctly stating deficits with moderate verbal cues  SLP Goal #4: Correctly state 3 reasons to use call light and reason to ask for assistance with moderate verbal cues.   General Temperature Spikes Noted: No Respiratory Status: Supplemental O2 delivered via (comment) Behavior/Cognition: Lethargic;Cooperative;Decreased sustained attention;Requires cueing;Pleasant mood Oral Cavity - Dentition: Dentures, top;Dentures, bottom Patient Positioning: Upright in bed  Oral Cavity - Oral Hygiene Does patient have any of the following "at risk" factors?: Saliva - thick, dry mouth;Lips - dry, cracked Brush patient's teeth BID  with toothbrush (using toothpaste with fluoride): Yes Patient is AT RISK - Oral Care Protocol followed (see row info): Yes   Treatment Treatment focused on: Cognition Skilled Treatment: see impression statement   GO     Royce Macadamia M.Ed [employer] [phone]  02/07/2013

## 2013-02-07 NOTE — Clinical Social Work Psychosocial (Signed)
Clinical Social Work Department BRIEF PSYCHOSOCIAL ASSESSMENT COMPLETED ON 02/05/13  Patient:  [name], [name]     Account Number:  [id number]     Admit date:  02/01/2013  Clinical Social Worker:  Delmer Islam  Date/Time:  02/07/2013 06:44 AM  Referred by:  Physician  Date Referred:  02/04/2013 Referred for  SNF Placement   Other Referral:   Interview type:  Other - See comment Other interview type:   Patient's friends [name] and [name]. Phone [phone] (h) and [phone] (c)    PSYCHOSOCIAL DATA Living Status:  ALONE Admitted from facility:   Level of care:   Primary support name:  [name] and [name] Primary support relationship to patient:  FRIEND Degree of support available:    CURRENT CONCERNS Current Concerns  Post-Acute Placement   Other Concerns:    SOCIAL WORK ASSESSMENT / PLAN On 02/05/13 CSW talked with Mr. and Mrs. Criss Alvine regarding patient. They have known patient 25+ years and are very supportive and devoted to her. Since patient admitted to hospital theyhave been working with [facility] to get a HCPOA signed and also was getting paperwork from financial counselor to take to bank. Patient wants them to be her HCPOA and durable POA.    The Goldstons advised that patient 's parents are deceased and she was an only child and had no children. The patient's only family is a cousin in [place], however the cousins wife is the person who would occasionally make contact to check on patient. Last contact by cousin's wife has been over 6 months per the [facility]. Per MMrs. Criss Alvine reported that she no longer has a valid phone number for the cousin. At time of admission patient was living independently at [facility] apartments. Patient has a dog that the Goldston's are currently caring for.   Assessment/plan status:  Psychosocial Support/Ongoing Assessment of Needs Other assessment/ plan:   Information/referral to community resources:     PATIENT'S/FAMILY'S RESPONSE TO PLAN OF CARE: 02/05/13 - The Goldston's very supportive of patient and want the best for her. CSW will continue to follow and talk further with the Goldston's regarding discharge plans.

## 2013-02-07 NOTE — Care Management Note (Signed)
    Page 1 of 1   02/07/2013     4:49:53 PM   CARE MANAGEMENT NOTE 02/07/2013  Patient:  [name], [name]   Account Number:  [id number]  Date Initiated:  02/06/2013  Documentation initiated by:  Letha Cape  Subjective/Objective Assessment:   dx cva  admit- lives alone.     Action/Plan:   pt eval - rec snf   Anticipated DC Date:  02/08/2013   Anticipated DC Plan:  SKILLED NURSING FACILITY  In-house referral  Clinical Social Worker      DC Planning Services  CM consult      Choice offered to / List presented to:             Status of service:  Completed, signed off Medicare Important Message given?   (If response is "NO", the following Medicare IM given date fields will be blank) Date Medicare IM given:   Date Additional Medicare IM given:    Discharge Disposition:  SKILLED NURSING FACILITY  Per UR Regulation:  Reviewed for med. necessity/level of care/duration of stay  If discussed at Long Length of Stay Meetings, dates discussed:    Comments:  02/07/13 16:48 Letha Cape RN, BSN [phone] patient lives alone, per physical therapy recs snf, CSW following, patient is for [facility] on 4/25 per CSW.

## 2013-02-07 NOTE — Progress Notes (Signed)
Stroke Team Progress Note  HISTORY Who was brought to her PCP on 01/29/13 by "EMS per pt request as she lives alone, no family, and hard to stand or walk with worsening back pain, balance problem and 3 falls in the past month, last one yesterday with large painful bruise to left wrist. Has not been able to get her chronic pain med recently as she had no transport to come pick up the rx personally as per office policy. Has been out of most meds includiing the benicar HCT for at least 2 wks, with worsening LE edema below knees, better in the AM." Patient has no further records on file. Her friends came over and patient was not answering her door. Due to concern they opened the door and found her in her recliner, in urine and not moving. Patient was brought to ED and found to have Left UE and LE weakness. CT head showed an area of decreased attenuation in the right occipital / posterior right parietal lobe this is suspicious for evolving ischemia. Neurology was consulted to see patient for worrisome infarct. Patient admits to missing her ASA over last few days. Patient was not a TPA candidate secondary to unknown time last seem well. She was admitted for further evaluation and treatment.  SUBJECTIVE Nursing staff at bedside.  OBJECTIVE Most recent Vital Signs: Filed Vitals:   02/06/13 1530 02/06/13 1544 02/06/13 2101 02/07/13 0604  BP: 181/92 187/93 144/69 158/64  Pulse:  86 108 99  Temp:   98.6 F (37 C) 98 F (36.7 C)  TempSrc:   Oral Oral  Resp: 17 18 18 22   Height:      Weight:      SpO2: 96% 95% 95% 96%   CBG (last 3)   Recent Labs  02/07/13 0032 02/07/13 0416 02/07/13 0753  GLUCAP 165* 132* 152*   IV Fluid Intake:   . sodium chloride 50 mL/hr at 02/07/13 0216    MEDICATIONS  . amLODipine  10 mg Oral Daily  . aspirin  300 mg Rectal Daily   Or  . aspirin  325 mg Oral Daily  . atorvastatin  40 mg Oral Daily  . enoxaparin (LOVENOX) injection  40 mg Subcutaneous Q24H  .  fluticasone  1 spray Each Nare QHS  . hydrochlorothiazide  25 mg Oral Daily   And  . irbesartan  300 mg Oral Daily  . insulin aspart  0-9 Units Subcutaneous Q4H  . insulin glargine  20 Units Subcutaneous QHS  . isosorbide-hydrALAZINE  2 tablet Oral BID  . metoCLOPramide  10 mg Oral Daily  . metoprolol succinate  100 mg Oral Daily  . potassium chloride  10 mEq Intravenous Q1 Hr x 4  . potassium chloride  40 mEq Oral BID   PRN:  clonazePAM, hydrALAZINE, labetalol, senna-docusate  Diet:  Dysphagia 1 thin liquids Activity:  OOB DVT Prophylaxis:  Lovenox 40 mg sq daily   CLINICALLY SIGNIFICANT STUDIES Basic Metabolic Panel:   Recent Labs Lab 02/01/13 1351 02/01/13 1430 02/01/13 1651 02/07/13 0653  NA 138 141  --  141  K 4.0 3.9  --  2.6*  CL 100 105  --  105  CO2 22  --   --  23  GLUCOSE 314* 322*  --  134*  BUN 17 17  --  16  CREATININE 0.53 0.60 0.48* 0.55  CALCIUM 9.8  --   --  9.0   Liver Function Tests:   Recent Labs Lab 02/01/13 1351  AST 41*  ALT 23  ALKPHOS 163*  BILITOT 0.7  PROT 7.8  ALBUMIN 3.6   CBC:   Recent Labs Lab 02/01/13 1351 02/01/13 1430 02/01/13 1651  WBC 8.1  --  9.0  NEUTROABS 6.6  --   --   HGB 15.7* 16.3* 16.5*  HCT 45.4 48.0* 46.7*  MCV 87.0  --  85.5  PLT 183  --  184   Coagulation:   Recent Labs Lab 02/01/13 1351  LABPROT 14.2  INR 1.11   Cardiac Enzymes:   Recent Labs Lab 02/01/13 1651 02/01/13 2218 02/02/13 0420  CKTOTAL 2670*  --   --   CKMB 10.5*  --   --   TROPONINI <0.30 <0.30 <0.30   Urinalysis:   Recent Labs Lab 02/01/13 1452  COLORURINE YELLOW  LABSPEC 1.031*  PHURINE 5.5  GLUCOSEU >1000*  HGBUR NEGATIVE  BILIRUBINUR LARGE*  KETONESUR >80*  PROTEINUR 30*  UROBILINOGEN 1.0  NITRITE NEGATIVE  LEUKOCYTESUR NEGATIVE   Lipid Panel    Component Value Date/Time   CHOL 275* 02/02/2013 0420   TRIG 171* 02/02/2013 0420   HDL 25* 02/02/2013 0420   CHOLHDL 11.0 02/02/2013 0420   VLDL 34 02/02/2013  0420   LDLCALC 216* 02/02/2013 0420   HgbA1C  Lab Results  Component Value Date   HGBA1C 8.8* 02/03/2013    Urine Drug Screen:     Component Value Date/Time   LABOPIA NONE DETECTED 02/01/2013 1452   COCAINSCRNUR NONE DETECTED 02/01/2013 1452   LABBENZ NONE DETECTED 02/01/2013 1452   AMPHETMU NONE DETECTED 02/01/2013 1452   THCU NONE DETECTED 02/01/2013 1452   LABBARB NONE DETECTED 02/01/2013 1452    Alcohol Level:   Recent Labs Lab 02/01/13 1351  ETH <11   CT of the brain  02/01/2013 1.  Area of decreased attenuation in the right occipital / posterior right parietal lobe best seen in axial image 17 and 16. This is suspicious for evolving ischemia.  Further evaluation with brain MRI with diffusion imaging is recommended. 2.  No intracranial hemorrhage, mass effect or midline shift.  Mild cerebral atrophy.   MRI of the brain  02/01/2013 1.  Confluent acute infarct in the right PCA territory with occipital lobe mild cytotoxic edema.  However, there are also numerous small acute infarcts in the left ACA, left PCA, and bilateral MCA territory.  Constellation of findings is therefore most suggestive of acute embolic event. 2.  No mass effect or hemorrhage.   MRA of the brain  02/01/2013   1.  Occlusion or near occlusion of the right PCA in the distal P2/proximal P3 segment. 2. Nondominant distal left vertebral artery, might be occluded in the neck and reconstituted from the right vertebral artery in the posterior fossa.  Still, the absence of acute MRI changes in the posterior fossa suggest this is not of acute clinical significance. 3.  Intracranial atherosclerosis but no other major circle Willis branch occlusion or hemodynamically significant intracranial stenosis.  2D Echocardiogram  EF 60-65% with no source of embolus.   TEE  No source of embolus, no PFO  Carotid Doppler  No evidence of hemodynamically significant internal carotid artery stenosis. Vertebral artery flow is antegrade.   CXR   02/01/2013  Possible mild right upper and lower lobe opacity, aspiration or pneumonia not excluded.    EKG  normal sinus rhythm, PAC's noted.   Therapy Recommendations SNF  Physical Exam   Gen: In bed, NAD  MS: Awake, Alert, oriented to hospital,  but not  To [place]. Severe left neglect.  ZO:XWRUE, right gaze preference, but will track with smooth pursuit across midline.  Motor: 5/5 on right, at least 4+/5 on left, though still difficult to test due to neglect.  Sensory:continues to identify left sided stimuli as occuring on right.   ASSESSMENT [name] is a 79 y.o. female presenting with Left UE and LE weakness, found down. Imaging confirms a right PCA and left MCA and PCA infarcts. Right occipital lobe with mild cytotoxic edema.  Infarcts felt to be  embolic secondary to unknown etiology. TEE negative. Atrial fibrillation is the most common etiology in this age group.  On  no antiplatelets prior to admission. Now on aspirin 325 mg orally every day for secondary stroke prevention. Patient with resultant left hemiparesis.   Diabetes, uncontrolled, HgbA1c 8.8, goal < 7.0  Hypertension Hyperlipidemia, LDL 216, on statin PTA, on statin now, goal LDL < 100 (<70 for diabetics) CAD Chronic back pain Hypokalemia  Hospital day # 6  TREATMENT/PLAN  Continue aspirin 325 mg orally every day for secondary stroke prevention. Please schedule outpatient telemetry monitoring to assess patient for atrial fibrillation as source of stroke. May be arranged with patient's cardiologist, or cardiologist of choice.  SNF at time of discharge  Patient has a 10-15% risk of having another stroke over the next year, the highest risk is within 2 weeks of the most recent stroke/TIA (risk of having a stroke following a stroke or TIA is the same). Ongoing risk factor control by Primary Care Physician Stroke Service will sign off. Please call should any needs arise. Follow up with Dr. Pearlean Brownie, Stroke  Clinic, in 2 months.   Annie Main, MSN, RN, ANVP-BC, ANP-BC, [facility] Pager: [phone] 02/07/2013 11:35 AM  I have personally obtained a history, examined the patient, evaluated imaging results, and formulated the assessment and plan of care. I agree with the above.  Delia Heady, MD

## 2013-02-07 NOTE — Clinical Social Work Placement (Signed)
Clinical Social Work Department CLINICAL SOCIAL WORK PLACEMENT NOTE 02/07/2013  Patient:  [name], [name]  Account Number:  [id number] Admit date:  02/01/2013  Clinical Social Worker:  Genelle Bal, LCSW  Date/time:  02/07/2013 06:23 AM  Clinical Social Work is seeking post-discharge placement for this patient at the following level of care:   SKILLED NURSING   (*CSW will update this form in Epic as items are completed)     Patient/family provided with [facility] Department of Clinical Social Work's list of facilities offering this level of care within the geographic area requested by the patient (or if unable, by the patient's family).  02/07/2013  Patient/family informed of their freedom to choose among providers that offer the needed level of care, that participate in Medicare, Medicaid or managed care program needed by the patient, have an available bed and are willing to accept the patient.    Patient/family informed of [facility]' ownership interest in [facility], as well as of the fact that they are under no obligation to receive care at this facility.  PASARR submitted to EDS on 05/28/08 PASARR number received from EDS on 05/28/08 - [id number]  FL2 transmitted to all facilities in geographic area requested by pt/family on   FL2 transmitted to all facilities within larger geographic area on   Patient informed that his/her managed care company has contracts with or will negotiate with  certain facilities, including the following:     Patient/family informed of bed offers received:  02/07/2013 Patient chooses bed at [facility] Physician recommends and patient chooses bed at    Patient to be transferred to [facility] on  02/08/2013 Patient to be transferred to facility by ambulance  The following physician request were entered in Epic:   Additional Comments: 02/07/13 - Mr. Lonie Peak talked with Albin Felling at [facility] and appointment scheduled for  10:30 am on the 15th to complete admissions paperwork.

## 2013-02-07 NOTE — Clinical Social Work Psych Assess (Signed)
Clinical Social Work Department CLINICAL SOCIAL WORK PSYCHIATRY SERVICE LINE ASSESSMENT 02/07/2013  Patient:  [name]  Account:  [id number]  Admit Date:  02/01/2013  Clinical Social Worker:  Read Drivers  Date/Time:  02/06/2013 09:56 AM Referred by:  RN  Date referred:  02/06/2013 Reason for Referral  Behavioral Health Issues  Competency/Guardianship   Presenting Symptoms/Problems (In the person's/family's own words):   pt not able to care for self and refusing medical care    Abuse/Neglect/Trauma Comments:   unable to accurately assess    Psychiatric medications:  none reported or noted   Current Mental Health Hospitalizations/Previous Mental Health History:   unable to accurately assess   Current provider:   unable to accurately assess   Place and Date:   unable to accuratley assess   Current Medications:   Scheduled Meds:      . amLODipine  10 mg Oral Daily  . aspirin  300 mg Rectal Daily   Or     . aspirin  325 mg Oral Daily  . atorvastatin  40 mg Oral Daily  . enoxaparin (LOVENOX) injection  40 mg Subcutaneous Q24H  . fluticasone  1 spray Each Nare QHS  . hydrochlorothiazide  12.5 mg Oral Daily  . hydrochlorothiazide  25 mg Oral Daily   And     . irbesartan  300 mg Oral Daily  . insulin aspart  0-9 Units Subcutaneous Q4H  . insulin glargine  20 Units Subcutaneous QHS  . isosorbide-hydrALAZINE  2 tablet Oral BID  . metoCLOPramide  10 mg Oral Daily  . metoprolol succinate  50 mg Oral Daily        Continuous Infusions:      . sodium chloride 75 mL/hr at 02/06/13 0658          PRN Meds:.clonazePAM, hydrALAZINE, labetalol, senna-docusate       Previous Impatient Admission/Date/Reason:   unable to accurately assess   Emotional Health / Current Symptoms     Suicide attempt in the past:   unable to accurately assess   Other harmful behavior:   unable to accurately assess   Psychotic/Dissociative Symptoms  Inability to care for self    Other Psychotic/Dissociative Symptoms:   unable to accurately assess    Attention/Behavioral Symptoms  Unable to accurately assess   Other Attention / Behavioral Symptoms:   unable to accurately assess    Cognitive Impairment  Recent Memory Impairment  Poor Judgement  Poor/Impaired Decision-Making  Recent Memory Impairment   Other Cognitive Impairment:    Mood and Adjustment  Guarded     Anxiety (frequency):   unable to accurately assess   Phobia (specify):   unable to accurately assess   Compulsive behavior (specify):   unable to accurately asess   Obsessive behavior (specify):   unable to accurately assess   Other:   unable to accurately assess   Substance Abuse/Use  None   SBIRT completed (please refer for detailed history):  N  Self-reported substance use:   untested   Urinary Drug Screen Completed:  N Alcohol level:   untested    Environmental/Housing/Living Arrangement  SKILLED NURSING FACILITY   Who is in the home:   pt will likely need SNF per PT   Emergency contact:  Donnie and Levy Sjogren, [place], [phone]   Financial  Medicare   Patient's Strengths and Goals (patient's own words):   pt has POA.  Pt has support system   Clinical Social Worker's Interpretive Summary:  Psych CSW attempted to assess pt.  Pt not oriented. Pt contacted Betty [place], listed on facesheet as friend. Kathie Rhodes stated that [facility] and Burna Mortimer were working on [employer]. Psych CSW thanked [place] for her time.  Psych CSW contacted Donnie at [phone].  Donnie stated that HCPOA paperwork had been filed and is on pt chart here at the hospital.  Moise Boring was asking questions re: pt medical care and if pt had a certain procedure done.  Psych CSW referred Donnie to unit RN at [phone].  Psych CSW explained to [facility] that psychiatrist states pt did not have capacity to make her own decisions.  Donnie was aware and is willing and able to make these decisions on her behalf.  Psych CSW  shared with [facility] that PT was recommending SNF upon d/c.  Donnie was aware.  Donnie stated that he was requesting [employer] and requested CSW to run pt bed search for the [place] area to be closer to [place].  Psych CSW will pass this information along to the unit CSW, Erie Noe.  Handoff given.  Psych consult was for capacity.  Capacity was determined.  Psych CSW signing off.  Please re-consult as needed.   Disposition:  Psych Clinical Social Worker signing off

## 2013-02-07 NOTE — Progress Notes (Signed)
PHYSICAL THERAPY PROGRESS NOTE: 02/05/13 1700  PT Visit Information  Last PT Received On 02/05/13  Assistance Needed +2  PT Time Calculation  PT Start Time 0316  PT Stop Time 0330  PT Time Calculation (min) 14 min  Subjective Data  Subjective I guess so (answer to several questions)  Patient Stated Goal None stated  Precautions  Precautions Fall  Precaution Comments L neglect  Restrictions  Weight Bearing Restrictions No  Other Position/Activity Restrictions Cast on L forearm.  Cognition  Arousal/Alertness Lethargic  Behavior During Therapy Flat affect  Overall Cognitive Status Impaired/Different from baseline  Area of Impairment Orientation;Attention;Memory;Following commands;Safety/judgement;Awareness;Problem solving  Orientation Level Disoriented to;Place;Time;Situation  Current Attention Level Focused  Memory Decreased recall of precautions  Following Commands Follows one step commands inconsistently  Safety/Judgement Decreased awareness of safety;Decreased awareness of deficits  Awareness Intellectual  Problem Solving Slow processing;Decreased initiation;Difficulty sequencing  General Comments increased time to respond to all questions, decreased awarenss of deficits  Bed Mobility  Bed Mobility Supine to Sit  Rolling Left 1: +2 Total assist  Rolling Left: Patient Percentage 10%  Left Sidelying to Sit 1: +2 Total assist;With rails;HOB elevated  Left Sidelying to Sit: Patient Percentage 10%  Sitting - Scoot to Edge of Bed 1: +1 Total assist  Details for Bed Mobility Assistance Pt continues to require max cueing for movement sequencing, +2 assist to rotate hips and shoulders to roll to left, +2 assist to raise shoulders and manage LE to sit up from side, and +2 total assist to raise LE and manage trunk to return to sidelying.    Transfers  Transfers Sit to Stand;Stand to Sit  Sit to Stand 1: +2 Total assist;From chair/3-in-1  Sit to Stand: Patient Percentage 20%  Stand to  Sit 1: +2 Total assist;To bed;With upper extremity assist  Stand to Sit: Patient Percentage 20%  Stand Pivot Transfers Not tested (comment)  Details for Transfer Assistance performed 3 sit<-> stand trials to/from EOB with assist to block bilateral feet and knees, extend hips and knees and support trunk.    Ambulation/Gait  Ambulation/Gait Assistance Not tested (comment)  Balance  Balance Assessed Yes  Static Sitting Balance  Static Sitting - Balance Support Feet supported;Right upper extremity supported  Static Sitting - Level of Assistance 5: Stand by assistance;Other (comment) (Pt falling R)  Static Sitting - Comment/# of Minutes Sat for several minutes on EOB with no LOB.   Pt continues to present with Right gaze preference and ability to tack to midline.    PT - End of Session  Equipment Utilized During Treatment Gait belt  Activity Tolerance Patient limited by fatigue  Patient left in bed;with call bell/phone within reach;with bed alarm set  Nurse Communication Mobility status  PT - Assessment/Plan  Comments on Treatment Session Pt left in Right sidelying with pillow under LUE and bolster at the R side of her face to hold pt's head/neck in neutral position.    PT Plan Discharge plan remains appropriate;Frequency remains appropriate  PT Frequency Min 3X/week  Follow Up Recommendations SNF  PT equipment None recommended by PT  Acute Rehab PT Goals  PT Goal Formulation With patient  Time For Goal Achievement 02/16/13  Potential to Achieve Goals Fair  Pt will Roll Supine to Left Side with min assist;with rail  PT Goal: Rolling Supine to Left Side - Progress Progressing toward goal  Pt will go Supine/Side to Sit with mod assist;with rail  PT Goal: Supine/Side to Sit - Progress Progressing  toward goal  Pt will Sit at [facility] of Bed with modified independence;6-10 min;Other (comment)  PT Goal: Sit at [facility] Of Bed - Progress Progressing toward goal  Pt will go Sit to Supine/Side with mod  assist  PT Goal: Sit to Supine/Side - Progress Progressing toward goal  Pt will go Sit to Stand with mod assist  PT Goal: Sit to Stand - Progress Progressing toward goal  Pt will go Stand to Sit with mod assist  PT Goal: Stand to Sit - Progress Progressing toward goal  PT General Charges  $$ ACUTE PT VISIT 1 Procedure  PT Treatments  $Therapeutic Activity 8-22 mins  Nasiya Pascual L. Martrell Eguia DPT [phone]

## 2013-02-07 NOTE — Discharge Summary (Addendum)
Physician Discharge Summary  [name] UJW:[id number] DOB: 22-Apr-1934 DOA: 02/01/2013  PCP: Oliver Barre, MD  Admit date: 02/01/2013 Discharge date: 02/08/2013  Time spent: 29  Minutes  Patient was medically stable for discharge on 4/25, but stayed overnight as her care-takers and social work are arranging transfer to SNF.  Recommendations for Outpatient Follow-up:  1. Physical, Occupational and Speech Therapy at [facility]. 2. Would recheck swallow eval in 5-7 days as patient will likely improve. 3. Check BMET on 5/2.  Patient developed hypokalemia in the hospital.  4. Patient will need follow up for left scaphoid fracture. 5. Follow up with [facility] for evaluation of Atrial Fibrillation as a possible source of stroke 6. Follow up with Dr. Pearlean Brownie in 2 months.  Careful management of her BP, DM, Lipids with necessary medication adjustments at SNF.  Discharge Diagnoses:  Principal Problem:   CVA (cerebral infarction) Active Problems:   HYPERCHOLESTEROLEMIA   HYPERTENSION   Depression   Diastolic CHF, chronic   Lumbar degenerative disc disease   Failure to thrive in childhood   Type II or unspecified type diabetes mellitus without mention of complication, uncontrolled   Hypokalemia   Discharge Condition: Stable, regaining function.  Diet recommendation: Dysphagia 1, Honey Thick, carb modified.  Filed Weights   02/02/13 1400  Weight: 74.7 kg (164 lb 10.9 oz)    History of present illness at the time of admission:  79 year old female with a history of hypertension, diabetes, hypercholesterolemia and coronary disease with a history of congestive heart failure who presents with altered mental status. According to the paramedics, friends found the patient in her recliner in her house after not having seen her or heard from her in several days. She was unable to get out of the chair with left-sided weakness, altered mental status and speech difficulties.  She was found to have soiled her undergarments. The patient is unable to contributory history history the occasional yes or no questions only. It is unsure when these symptoms started, she has not been seen for several days prior to being seen this morning. Her baseline is ability to care for herself completely.  She complains of difficulty to stand or walk with worsening back pain, balance problem and 3 falls in the past month, last one 4/14 with large painful bruise to left wrist.  Xray of her hand shows Subtle lucency mid scaphoid bone. Suspicious for non displaced fracture.Patient was brought to ED and found to have Left UE and LE weakness. CT head showed an area of decreased attenuation in the right occipital / posterior right parietal lobe this is suspicious for evolving ischemia. Neurology was consulted to see patient for worrisome infarct. Patient admits to missing her ASA over last few days.    Hospital Course:  *CVA (cerebral infarction)  -Seen by neurology -MRI demonstrated:  acute infarct in the right PCA territory with occipital lobe mild cytotoxic edema, also  numerous small acute infarcts in the left ACA, left PCA, and bilateral MCA territory. Constellation of findings is therefore most suggestive of acute embolic event. -TEE completed.  No PFO or thrombus.  She needed better BP control Toprol was increaseed to 100 mg, continue with bidil, norvac, ARB and HCTZ.  -Continue with  ASA and Statins   Diabetes mellitus, insulin dependent (IDDM), uncontrolled  -CBG's stable.  Continue with low dose lantus cont SSI.  - HbgA1c 9.1 on 4/19   Diastolic CHF, chronic/HYPERTENSION  -Appears dry today.  No evidence of overload. -  cont Avapro, HCTZ, metorpolol  -TTE-EF preserved   Hypokalemia -2/2 to HCTZ therapy -ok scheduled KCL-please repeat lytes as indicated above  HYPERCHOLESTEROLEMIA  - statins   Psychaitry  -unfortunately lacks capacity-seen by psych on 4/22   Non Displaced  scaphoid fracture -Prior to admission.  Xray taken 4/16 -Left wrist in cast   Procedures:    MRI of the brain 02/01/2013 1. Confluent acute infarct in the right PCA territory with occipital lobe mild cytotoxic edema. However, there are also numerous small acute infarcts in the left ACA, left PCA, and bilateral MCA territory. Constellation of findings is therefore most suggestive of acute embolic event. 2. No mass effect or hemorrhage.   MRA of the brain 02/01/2013 1. Occlusion or near occlusion of the right PCA in the distal P2/proximal P3 segment. 2. Nondominant distal left vertebral artery, might be occluded in the neck and reconstituted from the right vertebral artery in the posterior fossa. Still, the absence of acute MRI changes in the posterior fossa suggest this is not of acute clinical significance. 3. Intracranial atherosclerosis but no other major circle Willis branch occlusion or hemodynamically significant intracranial stenosis.   2D Echocardiogram EF 60-65% with no source of embolus.   TEE   No PFO, no Thrombus, Preserved LV function.  Carotid Doppler No evidence of hemodynamically significant internal carotid artery stenosis. Vertebral artery flow is antegrade.   Consultations:  Neurology  Psychiatry  Cardiology (for TEE)  Discharge Exam: Filed Vitals:   02/07/13 1500 02/07/13 2022 02/08/13 0018 02/08/13 0404  BP: 146/82 138/61 137/73 152/69  Pulse: 88 76 79 79  Temp: 97.8 F (36.6 C) 98.7 F (37.1 C) 98.8 F (37.1 C) 97.4 F (36.3 C)  TempSrc: Oral Axillary Oral Oral  Resp: 18 18 18 20   Height:      Weight:      SpO2: 96% 94% 95% 96%   General: Alert, awake, oriented x2, in no acute distress. I'm noticing improvements each day HEENT: No bruits, no goiter. Mucous membranes dry.   Heart: Regular rate and rhythm with a short burst of fast beats, without murmurs, rubs, gallops.  Lungs: Good air movement, clear to auscultation.  Abdomen: Soft, nontender,  nondistended, positive bowel sounds.  Extremities:  No lower extremity edema. Neuro: gives short sentenance appropriate answers. Left sided neglect, Able to lightly squeeze both of my hands with hers, but much weaker on the left.  Able to move right leg, but not left.   Discharge Instructions      Discharge Orders   Future Appointments Provider Department Dept Phone   02/26/2013 10:00 AM Corwin Levins, MD [facility] [phone]   Future Orders Complete By Expires     Diet - low sodium heart healthy  As directed     Diet general  As directed     Comments:      Carb modified, Dysphagia 1 diet, Honey Thick Liquids    Increase activity slowly  As directed     Increase activity slowly  As directed         Medication List    STOP taking these medications       gabapentin 400 MG capsule  Commonly known as:  NEURONTIN     insulin lispro 100 UNIT/ML injection  Commonly known as:  HUMALOG     insulin NPH 100 UNIT/ML injection  Commonly known as:  HUMULIN N,NOVOLIN N     INSULIN SYRINGE .5CC/30GX1/2" 30G X 1/2" 0.5 ML  Misc     Insulin Syringe-Needle U-100 31G X 5/16" 1 ML Misc  Commonly known as:  BD INSULIN SYRINGE ULTRAFINE     irbesartan-hydrochlorothiazide 300-12.5 MG per tablet  Commonly known as:  AVALIDE     nortriptyline 25 MG capsule  Commonly known as:  PAMELOR      TAKE these medications       ACCU-CHEK AVIVA test strip  Generic drug:  glucose blood  Use as instructed. Check blood sugar four times a day     amLODipine 10 MG tablet  Commonly known as:  NORVASC  Take 1 tablet (10 mg total) by mouth daily.     aspirin 325 MG tablet  Take 1 tablet (325 mg total) by mouth daily.     atorvastatin 10 MG tablet  Commonly known as:  LIPITOR  Take 1 tablet (10 mg total) by mouth daily.     clonazePAM 0.5 MG tablet  Commonly known as:  KLONOPIN  Take 0.5 tablets (0.25 mg total) by mouth at bedtime as needed for anxiety.      fexofenadine 180 MG tablet  Commonly known as:  ALLEGRA  Take 1 tablet (180 mg total) by mouth daily.     fluticasone 50 MCG/ACT nasal spray  Commonly known as:  FLONASE  Place 1 spray into the nose at bedtime. In each nostril     hydrochlorothiazide 12.5 MG capsule  Commonly known as:  MICROZIDE  Take 2 capsules (25 mg total) by mouth daily.     HYDROcodone-acetaminophen 5-325 MG per tablet  Commonly known as:  NORCO/VICODIN  Take 1 tablet by mouth every 6 (six) hours as needed for pain.     insulin aspart 100 UNIT/ML injection  Commonly known as:  novoLOG  Inject 0-9 Units into the skin every 4 (four) hours.     insulin aspart 100 UNIT/ML injection  Commonly known as:  novoLOG  Inject 0-15 Units into the skin 3 (three) times daily with meals. CBG 70 - 120: 0 units  CBG 121 - 150: 2 units  CBG 151 - 200: 3 units  CBG 201 - 250: 5 units  CBG 251 - 300: 8 units  CBG 301 - 350: 11 units  CBG 351 - 400: 15 units     insulin glargine 100 UNIT/ML injection  Commonly known as:  LANTUS  Inject 0.2 mLs (20 Units total) into the skin at bedtime.     irbesartan 300 MG tablet  Commonly known as:  AVAPRO  Take 1 tablet (300 mg total) by mouth daily.     isosorbide-hydrALAZINE 20-37.5 MG per tablet  Commonly known as:  BIDIL  Take 2 tablets by mouth 2 (two) times daily.     metoCLOPramide 5 MG tablet  Commonly known as:  REGLAN  Take 1 tablet (5 mg total) by mouth daily.     metoprolol succinate 50 MG 24 hr tablet  Commonly known as:  TOPROL-XL  Take 2 tablets (100 mg total) by mouth daily. Take with or immediately following a meal.     omeprazole 20 MG capsule  Commonly known as:  PRILOSEC  Take 1 capsule (20 mg total) by mouth daily.     potassium chloride 10 MEQ tablet  Commonly known as:  K-DUR  Take 1 tablet (10 mEq total) by mouth 2 (two) times daily.     senna-docusate 8.6-50 MG per tablet  Commonly known as:  Senokot-S  Take 1 tablet by mouth at bedtime as  needed.       Follow-up Information   Follow up with Gates Rigg, MD In 2 months. (stroke clinic)    Contact information:   [address] [place] [address] [phone]       Schedule an appointment as soon as possible for a visit in 1 week to follow up. (Follow up with [facility] for evaluation of possible Afib as a cause of stroke.)        The results of significant diagnostics from this hospitalization (including imaging, microbiology, ancillary and laboratory) are listed below for reference.    Significant Diagnostic Studies: Dg Chest 2 View  02/01/2013  *RADIOLOGY REPORT*  Clinical Data: Stroke  CHEST - 2 VIEW  Comparison: 12/16/2010  Findings: Chronic interstitial markings.  Mild superimposed opacity in the right upper and lower lobe is possible, possibly reflecting aspiration or pneumonia. No pleural effusion or pneumothorax.  The heart is normal in size.  Mild degenerative changes of the visualized thoracolumbar spine.  IMPRESSION: Possible mild right upper and lower lobe opacity, aspiration or pneumonia not excluded.   Original Report Authenticated By: Charline Bills, M.D.    Ct Head Wo Contrast  02/01/2013  *RADIOLOGY REPORT*  Clinical Data: Cerebrovascular accident  CT HEAD WITHOUT CONTRAST  Technique:  Contiguous axial images were obtained from the base of the skull through the vertex without contrast.  Comparison: 07/30/2009  Findings: No intracranial hemorrhage, mass effect or midline shift. Air fluid level is noted in the right maxillary sinus suspicious for sinusitis.  Mastoid air cells are unremarkable.  Axial image 17 there is a area of decreased attenuation in the right occipital lobe and right posterior parietal lobe. Highly suspicious for evolving ischemia. Further evaluation with brain MRI with diffusion imaging is recommended.  Stable cerebral atrophy. Stable periventricular chronic white matter disease.  No mass lesion is noted on this  unenhanced scan.  IMPRESSION:  1.  Area of decreased attenuation in the right occipital / posterior right parietal lobe best seen in axial image 17 and 16. This is suspicious for evolving ischemia.  Further evaluation with brain MRI with diffusion imaging is recommended. 2.  No intracranial hemorrhage, mass effect or midline shift.  Mild cerebral atrophy.  Critical findings discussed with Dr. Hyacinth Meeker.   Original Report Authenticated By: Natasha Mead, M.D.     Mr Brain Wo Contrast  02/01/2013  *RADIOLOGY REPORT*  Clinical Data:  79 year old female found unresponsive.  Left side weakness.  Abnormal head CT.  Comparison: Head CT without contrast 02/01/2013.  MRI HEAD WITHOUT CONTRAST  Technique: Multiplanar, multiecho pulse sequences of the brain and surrounding structures were obtained according to standard protocol without intravenous contrast.  Findings: There is confluent restricted diffusion in the right occipital lobe PCA territory.  However, there are also multiple sub centimeter punctate foci of restricted diffusion in the bilateral MCA, left ACA, and bilateral PCA territories (see series 11). Diffusion is mildly heterogeneous in the posterior fossa, but no definite posterior fossa restricted diffusion is identified.  Major intracranial vascular flow voids are preserved.  Mild T2 and FLAIR hyperintensity in the right occipital lobe. Minimal to mild T2 and FLAIR hyperintensity at the other acutely affected areas.  Underlying chronic small infarct in the right superior cerebellum.  Mild to moderate for age bilateral deep gray matter nuclei T2 heterogeneity, not all related to the acute findings. No acute intracranial hemorrhage identified.  No midline shift, mass effect, or evidence of mass lesion.  Negative pituitary, cervicomedullary  junction and visualized cervical spine. No ventriculomegaly.  Normal bone marrow signal.  Small fluid level in the right maxillary sinus.  Other Visualized paranasal sinuses and  mastoids are clear.  Visualized orbit soft tissues are within normal limits. Negative scalp soft tissues.  IMPRESSION: 1.  Confluent acute infarct in the right PCA territory with occipital lobe mild cytotoxic edema.  However, there are also numerous small acute infarcts in the left ACA, left PCA, and bilateral MCA territory.  Constellation of findings is therefore most suggestive of acute embolic event. 2.  No mass effect or hemorrhage. 3.  See MRA findings below.  MRA HEAD WITHOUT CONTRAST  Technique: Angiographic images of the Circle of Willis were obtained using MRA technique without  intravenous contrast.  Findings: Diminutive distal left vertebral artery with absent antegrade flow signal in the distal neck.  There is some intracranial flow signal, and the left PICA appears patent. Dominant appearing distal right vertebral artery is antegrade flow. Patent right PICA origin.  Right vertebral artery supplies the basilar.  Bilateral AICA origins patent.  The basilar artery is irregular, but in part this might reflect MOTSA artifact.  At most, mild mid basilar artery stenosis.  SCA origins are within normal limits.  The right PCA origin is patent and within normal limits.  Right posterior communicating artery is present.  The left PCA is a fetal type.  There is occlusion or near occlusion of the right PCA in the distal P2/proximal P3 segment (series 503 image 12).  Distal left PCA branches appear patent.  Antegrade flow in both ICA siphons.  Mild ICA irregularity.  No ICA siphon stenosis.  Ophthalmic and both posterior communicating artery origins are within normal limits.  Carotid termini are patent.  MCA and ACA origins are within normal limits.  The anterior communicating arteries diminutive. Visualized bilateral ACA branches are within normal limits. Visualized bilateral MCA branches are within normal limits; no major MCA branch occlusion identified.  IMPRESSION: 1.  Occlusion or near occlusion of the right PCA  in the distal P2/proximal P3 segment. 2. Nondominant distal left vertebral artery, might be occluded in the neck and reconstituted from the right vertebral artery in the posterior fossa.  Still, the absence of acute MRI changes in the posterior fossa suggest this is not of acute clinical significance. 3.  Intracranial atherosclerosis but no other major circle Willis branch occlusion or hemodynamically significant intracranial stenosis.  Study discussed by telephone with Dr. Susie Cassette on 02/01/2013 at 1637 hours.   Original Report Authenticated By: Erskine Speed, M.D.     Microbiology: Recent Results (from the past 240 hour(s))  MRSA PCR SCREENING     Status: None   Collection Time    02/01/13  4:56 PM      Result Value Range Status   MRSA by PCR NEGATIVE  NEGATIVE Final   Comment:            The GeneXpert MRSA Assay (FDA     approved for NASAL specimens     only), is one component of a     comprehensive MRSA colonization     surveillance program. It is not     intended to diagnose MRSA     infection nor to guide or     monitor treatment for     MRSA infections.     Labs: Basic Metabolic Panel:  Recent Labs Lab 02/01/13 1351 02/01/13 1430 02/01/13 1651 02/07/13 0653 02/08/13 0450  NA 138 141  --  141  --   K 4.0 3.9  --  2.6*  --   CL 100 105  --  105  --   CO2 22  --   --  23  --   GLUCOSE 314* 322*  --  134*  --   BUN 17 17  --  16  --   CREATININE 0.53 0.60 0.48* 0.55 0.50  CALCIUM 9.8  --   --  9.0  --    Liver Function Tests:  Recent Labs Lab 02/01/13 1351  AST 41*  ALT 23  ALKPHOS 163*  BILITOT 0.7  PROT 7.8  ALBUMIN 3.6   CBC:  Recent Labs Lab 02/01/13 1351 02/01/13 1430 02/01/13 1651  WBC 8.1  --  9.0  NEUTROABS 6.6  --   --   HGB 15.7* 16.3* 16.5*  HCT 45.4 48.0* 46.7*  MCV 87.0  --  85.5  PLT 183  --  184   Cardiac Enzymes:  Recent Labs Lab 02/01/13 1355 02/01/13 1651 02/01/13 2218 02/02/13 0420  CKTOTAL  --  2670*  --   --   CKMB  --   10.5*  --   --   TROPONINI <0.30 <0.30 <0.30 <0.30   CBG:  Recent Labs Lab 02/07/13 1744 02/07/13 2025 02/08/13 0016 02/08/13 0406 02/08/13 0754  GLUCAP 185* 201* 256* 148* 188*     Signed:  Conley Canal  [facility] 02/08/2013, 9:24 AM  Attending Patient seen and examined, agree with the assessment and plan as outlined above.  S Ghimire

## 2013-02-08 LAB — BASIC METABOLIC PANEL
BUN: 11 mg/dL (ref 6–23)
GFR calc non Af Amer: >90 mL/min (ref >90)
Glucose, Bld: 254 mg/dL — ABNORMAL HIGH (ref 70–99)
Potassium: 3.5 mEq/L (ref 3.5–5.1)

## 2013-02-08 LAB — GLUCOSE, CAPILLARY
Glucose-Capillary: 148 mg/dL — ABNORMAL HIGH (ref 70–99)
Glucose-Capillary: 188 mg/dL — ABNORMAL HIGH (ref 70–99)
Glucose-Capillary: 230 mg/dL — ABNORMAL HIGH (ref 70–99)
Glucose-Capillary: 256 mg/dL — ABNORMAL HIGH (ref 70–99)

## 2013-02-08 LAB — CREATININE, SERUM
Creatinine, Ser: 0.5 mg/dL (ref 0.50–1.10)
GFR calc non Af Amer: >90 mL/min (ref >90)

## 2013-02-08 MED ORDER — METOCLOPRAMIDE HCL 5 MG PO TABS
5.0000 mg | ORAL_TABLET | Freq: Every day | ORAL | Status: DC
  Administered 2013-02-08: 5 mg via ORAL
  Filled 2013-02-08: qty 1

## 2013-02-08 MED ORDER — METOCLOPRAMIDE HCL 5 MG PO TABS: 5.0000 mg | ORAL_TABLET | Freq: Every day | ORAL | Status: DC

## 2013-02-08 MED ORDER — POTASSIUM CHLORIDE CRYS ER 20 MEQ PO TBCR
20.0000 meq | EXTENDED_RELEASE_TABLET | Freq: Every day | ORAL | Status: DC
  Administered 2013-02-08: 20 meq via ORAL
  Filled 2013-02-08: qty 1

## 2013-02-08 MED ORDER — INSULIN ASPART 100 UNIT/ML ~~LOC~~ SOLN: 0.0000 [IU] | Freq: Three times a day (TID) | SUBCUTANEOUS | Status: DC

## 2013-02-08 NOTE — Progress Notes (Signed)
[facility] PROGRESS NOTE    Assessment/Plan:  *CVA (cerebral infarction) -Embolic CVA evidenced on MRI (results below) -BP moderately controlled Toprol to 100 mg, c/w bidil, norvac, ARB and HCTZ. -c/w ASA and Statins  Hypokalemia -2/2 HCTZ-repleted K yesterday -awaiting repeat lytes in am   Diabetes mellitus, insulin dependent (IDDM), uncontrolled -CBG's stable-c/w low dose lantus cont SSI. - HbgA1c 9.1 on 4/19   Diastolic CHF, chronic/HYPERTENSION -continues to have mild lower extremity edema-but not overtly hypervolumic - cont Avapro, HCTZ, metorpolol -TTE-EF preserved  HYPERCHOLESTEROLEMIA - statins  Psychaitry -unfortunately lacks capacity-seen by psych on 4/22 -will need placement -social worker aware  Code Status: full  Family Communication: bedside  Disposition Plan: discharge to SNF hopefully on 4/24.   Consultants:  Neuro  Procedures: - MRI 4.18.2014: acute infarct in the right PCA territory with occipital lobe mild cytotoxic edema. However, there are also numerous small acute infarcts in the left ACA, left PCA, and bilateral MCA territory.  - MRA: Occlusion or near occlusion of the right PCA in the distal P2/proximal P3 segment  - Carotid doppler 4.19.2014: no obvious evidence of hemodynamically significant internal carotid artery stenosis >40% bilaterally. Vertebral arteries are patent with antegrade flow  - Echo 4.19.2014: estimated ejection fraction was in the range of 60% to 65  -TEE 02/06/13:  No Thrombus or PFO, LV function preserved   Antibiotics:  none   HPI/Subjective: Has no complaints.  Denies pain.   Objective: Filed Vitals:   02/07/13 1500 02/07/13 2022 02/08/13 0018 02/08/13 0404  BP: 146/82 138/61 137/73 152/69  Pulse: 88 76 79 79  Temp: 97.8 F (36.6 C) 98.7 F (37.1 C) 98.8 F (37.1 C) 97.4 F (36.3 C)  TempSrc: Oral Axillary Oral Oral  Resp: 18 18 18 20   Height:      Weight:      SpO2: 96% 94% 95% 96%     Intake/Output Summary (Last 24 hours) at 02/08/13 1610 Last data filed at 02/08/13 0842  Gross per 24 hour  Intake   1910 ml  Output      0 ml  Net   1910 ml   Filed Weights   02/02/13 1400  Weight: 74.7 kg (164 lb 10.9 oz)    Exam:  General: Alert, very sleepy today, oriented x2, in no acute distress.  HEENT: No bruits, no goiter.  Heart: Regular rate and rhythm with a short burst of fast beats, without murmurs, rubs, gallops.  Lungs: Good air movement, clear to auscultation.  Abdomen: Soft, nontender, nondistended, positive bowel sounds.  Neuro: gives  appropriate answers. Barely able to lightly squeeze both of my hands with hers.  Unable to move legs.     Data Reviewed: Basic Metabolic Panel:  Recent Labs Lab 02/01/13 1351 02/01/13 1430 02/01/13 1651 02/07/13 0653 02/08/13 0450  NA 138 141  --  141  --   K 4.0 3.9  --  2.6*  --   CL 100 105  --  105  --   CO2 22  --   --  23  --   GLUCOSE 314* 322*  --  134*  --   BUN 17 17  --  16  --   CREATININE 0.53 0.60 0.48* 0.55 0.50  CALCIUM 9.8  --   --  9.0  --    Liver Function Tests:  Recent Labs Lab 02/01/13 1351  AST 41*  ALT 23  ALKPHOS 163*  BILITOT 0.7  PROT 7.8  ALBUMIN 3.6  CBC:  Recent Labs Lab 02/01/13 1351 02/01/13 1430 02/01/13 1651  WBC 8.1  --  9.0  NEUTROABS 6.6  --   --   HGB 15.7* 16.3* 16.5*  HCT 45.4 48.0* 46.7*  MCV 87.0  --  85.5  PLT 183  --  184   Cardiac Enzymes:  Recent Labs Lab 02/01/13 1355 02/01/13 1651 02/01/13 2218 02/02/13 0420  CKTOTAL  --  2670*  --   --   CKMB  --  10.5*  --   --   TROPONINI <0.30 <0.30 <0.30 <0.30   CBG:  Recent Labs Lab 02/07/13 1744 02/07/13 2025 02/08/13 0016 02/08/13 0406 02/08/13 0754  GLUCAP 185* 201* 256* 148* 188*    Recent Results (from the past 240 hour(s))  MRSA PCR SCREENING     Status: None   Collection Time    02/01/13  4:56 PM      Result Value Range Status   MRSA by PCR NEGATIVE  NEGATIVE Final    Comment:            The GeneXpert MRSA Assay (FDA     approved for NASAL specimens     only), is one component of a     comprehensive MRSA colonization     surveillance program. It is not     intended to diagnose MRSA     infection nor to guide or     monitor treatment for     MRSA infections.     Studies: No results found.  Scheduled Meds: . amLODipine  10 mg Oral Daily  . aspirin  300 mg Rectal Daily   Or  . aspirin  325 mg Oral Daily  . atorvastatin  40 mg Oral Daily  . enoxaparin (LOVENOX) injection  40 mg Subcutaneous Q24H  . fluticasone  1 spray Each Nare QHS  . hydrochlorothiazide  25 mg Oral Daily   And  . irbesartan  300 mg Oral Daily  . insulin aspart  0-15 Units Subcutaneous TID WC  . insulin glargine  20 Units Subcutaneous QHS  . isosorbide-hydrALAZINE  2 tablet Oral BID  . metoCLOPramide  5 mg Oral Daily  . metoprolol succinate  100 mg Oral Daily   Continuous Infusions:     Conley Canal  [facility] Pager [phone]. If 8PM-8AM, please contact night-coverage at [email or web address], password [facility] 02/08/2013, 9:22 AM  LOS: 7 days   Attending Patient seen and examined, agree with the assessment and plan as outlined above. Await BMET this am.Ok for discharge to SNF  S Saloma Cadena

## 2013-02-08 NOTE — Progress Notes (Signed)
NURSING PROGRESS NOTE  RUTHE ROEMER [id number] Discharge Data: 02/08/2013 1:38 PM Attending Provider: Maretta Bees, MD WUJ:[id number] Jonny Ruiz, MD     Scot Jun to be D/C'd Skilled nursing facility per MD order.  All belongings returned to patient for patient to take home.   Last Vital Signs:  Blood pressure 143/65, pulse 86, temperature 98.7 F (37.1 C), temperature source Oral, resp. rate 20, height 5\' 3"  (1.6 m), weight 74.7 kg (164 lb 10.9 oz), SpO2 94.00%.  Discharge Medication List   Medication List    STOP taking these medications       gabapentin 400 MG capsule  Commonly known as:  NEURONTIN     insulin lispro 100 UNIT/ML injection  Commonly known as:  HUMALOG     insulin NPH 100 UNIT/ML injection  Commonly known as:  HUMULIN N,NOVOLIN N     INSULIN SYRINGE .5CC/30GX1/2" 30G X 1/2" 0.5 ML Misc     Insulin Syringe-Needle U-100 31G X 5/16" 1 ML Misc  Commonly known as:  BD INSULIN SYRINGE ULTRAFINE     irbesartan-hydrochlorothiazide 300-12.5 MG per tablet  Commonly known as:  AVALIDE     nortriptyline 25 MG capsule  Commonly known as:  PAMELOR      TAKE these medications       ACCU-CHEK AVIVA test strip  Generic drug:  glucose blood  Use as instructed. Check blood sugar four times a day     amLODipine 10 MG tablet  Commonly known as:  NORVASC  Take 1 tablet (10 mg total) by mouth daily.     aspirin 325 MG tablet  Take 1 tablet (325 mg total) by mouth daily.     atorvastatin 10 MG tablet  Commonly known as:  LIPITOR  Take 1 tablet (10 mg total) by mouth daily.     clonazePAM 0.5 MG tablet  Commonly known as:  KLONOPIN  Take 0.5 tablets (0.25 mg total) by mouth at bedtime as needed for anxiety.     fexofenadine 180 MG tablet  Commonly known as:  ALLEGRA  Take 1 tablet (180 mg total) by mouth daily.     fluticasone 50 MCG/ACT nasal spray  Commonly known as:  FLONASE  Place 1 spray into the nose at bedtime. In each nostril     hydrochlorothiazide 12.5 MG capsule  Commonly known as:  MICROZIDE  Take 2 capsules (25 mg total) by mouth daily.     HYDROcodone-acetaminophen 5-325 MG per tablet  Commonly known as:  NORCO/VICODIN  Take 1 tablet by mouth every 6 (six) hours as needed for pain.     insulin aspart 100 UNIT/ML injection  Commonly known as:  novoLOG  Inject 0-9 Units into the skin every 4 (four) hours.     insulin aspart 100 UNIT/ML injection  Commonly known as:  novoLOG  Inject 0-15 Units into the skin 3 (three) times daily with meals. CBG 70 - 120: 0 units  CBG 121 - 150: 2 units  CBG 151 - 200: 3 units  CBG 201 - 250: 5 units  CBG 251 - 300: 8 units  CBG 301 - 350: 11 units  CBG 351 - 400: 15 units     insulin glargine 100 UNIT/ML injection  Commonly known as:  LANTUS  Inject 0.2 mLs (20 Units total) into the skin at bedtime.     irbesartan 300 MG tablet  Commonly known as:  AVAPRO  Take 1 tablet (300 mg total) by mouth daily.  isosorbide-hydrALAZINE 20-37.5 MG per tablet  Commonly known as:  BIDIL  Take 2 tablets by mouth 2 (two) times daily.     metoCLOPramide 5 MG tablet  Commonly known as:  REGLAN  Take 1 tablet (5 mg total) by mouth daily.     metoprolol succinate 50 MG 24 hr tablet  Commonly known as:  TOPROL-XL  Take 2 tablets (100 mg total) by mouth daily. Take with or immediately following a meal.     omeprazole 20 MG capsule  Commonly known as:  PRILOSEC  Take 1 capsule (20 mg total) by mouth daily.     potassium chloride 10 MEQ tablet  Commonly known as:  K-DUR  Take 1 tablet (10 mEq total) by mouth 2 (two) times daily.     senna-docusate 8.6-50 MG per tablet  Commonly known as:  Senokot-S  Take 1 tablet by mouth at bedtime as needed.        Madelin Rear RN, MSN, [employer]

## 2013-02-08 NOTE — Progress Notes (Signed)
Report called to Malvin Johns, Judeth Cornfield RN.   Madelin Rear RN, MSN, [employer]

## 2013-02-08 NOTE — Progress Notes (Signed)
Pt to be d/c today to [facility].   Pt and family agreeable. Confirmed plans with facility.  Plan transfer via EMS.   Leron Croak, LCSWA [employer] [phone]

## 2013-02-11 ENCOUNTER — Non-Acute Institutional Stay (SKILLED_NURSING_FACILITY): Payer: Medicare Other | Admitting: Internal Medicine

## 2013-02-11 ENCOUNTER — Telehealth: Payer: Self-pay

## 2013-02-11 DIAGNOSIS — I10 Essential (primary) hypertension: Secondary | ICD-10-CM

## 2013-02-11 DIAGNOSIS — E118 Type 2 diabetes mellitus with unspecified complications: Secondary | ICD-10-CM

## 2013-02-11 DIAGNOSIS — IMO0002 Reserved for concepts with insufficient information to code with codable children: Secondary | ICD-10-CM

## 2013-02-11 DIAGNOSIS — I639 Cerebral infarction, unspecified: Secondary | ICD-10-CM

## 2013-02-11 DIAGNOSIS — S62009A Unspecified fracture of navicular [scaphoid] bone of unspecified wrist, initial encounter for closed fracture: Secondary | ICD-10-CM

## 2013-02-11 DIAGNOSIS — I6350 Cerebral infarction due to unspecified occlusion or stenosis of unspecified cerebral artery: Secondary | ICD-10-CM

## 2013-02-11 DIAGNOSIS — E1165 Type 2 diabetes mellitus with hyperglycemia: Secondary | ICD-10-CM

## 2013-02-11 DIAGNOSIS — S62002A Unspecified fracture of navicular [scaphoid] bone of left wrist, initial encounter for closed fracture: Secondary | ICD-10-CM

## 2013-02-11 DIAGNOSIS — I5032 Chronic diastolic (congestive) heart failure: Secondary | ICD-10-CM

## 2013-02-11 DIAGNOSIS — I509 Heart failure, unspecified: Secondary | ICD-10-CM

## 2013-02-11 NOTE — Progress Notes (Signed)
Patient ID: [name], female   DOB: 1934/07/26, 79 y.o.   MRN: [id number]    PCP: Oliver Barre, MD  Code Status: full code  Allergies  Allergen Reactions  . Ace Inhibitors     unknown  . Baclofen     unknown  . Cefaclor     unknown  . Chlorzoxazone     unknown  . Cloxacillin     unknown  . Codeine     unknown  . Diclofenac Sodium     unknown  . Erythromycin     unknown  . Meperidine Hcl     unknown  . Nsaids     unknown  . Promethazine     unknown  . Propoxyphene-Acetaminophen     unknown  . Sulfonamide Derivatives     unknown    Chief Complaint: new admit post hospitalization 02/01/13- 02/08/13  HPI:  79 year old female patient with history of hypertension, diabetes, CAD, chf and hyperlipidemia was admitted with altered mental status and left sided weakness. She had worsening back pain and problem with balance leading to falls and had a painful bruise in her left wrist.Xray of her hand showed non displaced fracture of the scaphoid.CT head showed an area of decreased attenuation in the right occipital / posterior right parietal lobe this is suspicious for evolving ischemia. Neurology was consulted with concerns for CVA. MRI showed acute infarct in the right PCA territory with occipital lobe mild cytotoxic edema and numerous small acute infarcts in the left ACA, left PCA, and bilateral MCA territory most suggestive of acute embolic event. TEE did not show any PFO or thrombus. Her aspirin was resumed and her bp meds were adjusted and statin continued. She was seen by psych and does not have capacity to make clinical decisions. She was sent to SNF for nursing needs and rehabilitation. She was seen in her room today. She appears tired and follows some commands. She gives one word answers for few questions. She is unable to provide much ROS. Her cbg was checked by nursing and is 469. No fever or diaphoresis reported by staff  Review of system- unable to obtain   Past  Medical History  Diagnosis Date  . DIABETES MELLITUS, TYPE I 05/30/2007  . HYPERCHOLESTEROLEMIA 10/23/2008  . HYPERTENSION 05/30/2007  . OSTEOARTHRITIS, LUMBAR SPINE 12/25/2007  . Depression 10/29/2011  . GERD (gastroesophageal reflux disease) 10/29/2011  . Diastolic CHF, chronic   . CAD (coronary artery disease)   . Anxiety problem   . Wedge compression fracture of T11-T12 vertebra   . Lumbar degenerative disc disease   . Anemia, unspecified   . Lumbar spinal stenosis   . Allergic rhinitis, cause unspecified 10/29/2011  . Osteoporosis 10/29/2011  . Failure to thrive in childhood 10/29/2011  . Osteoarthritis of right knee 10/29/2011  . History of colon polyps 11/01/2011  . Chronic pain 11/01/2011  . Cryptogenic stroke 01/2013    MRI demonstrated acute infarct in the right PCA territory as well as numerous other small acute infarcts in the left ACA, left PCA and bilateral MCA territories all consistent with embolic CVA. Carotid Dopplers were negative for ICA stenosis. Echocardiogram 02/02/13: EF 60-65%, normal wall motion. TEE 4/23 (performed by Dr. Elease Hashimoto): EF 55-60%, mild AI, no LAA clot, normal valves   Past Surgical History  Procedure Laterality Date  . Abdominal hysterectomy    . Cholecystectomy    . Appendectomy    . Back surgury    . Tee without cardioversion N/A  02/06/2013    Procedure: TRANSESOPHAGEAL ECHOCARDIOGRAM (TEE);  Surgeon: Vesta Mixer, MD;  Location: [facility] ENDOSCOPY;  Service: Cardiovascular;  Laterality: N/A;   Social History:   reports that she has quit smoking. She does not have any smokeless tobacco history on file. Her alcohol and drug histories are not on file.  Family History  Problem Relation Age of Onset  . Diabetes Neg Hx     No DM in her immdidate family  . Heart disease Father     Medications: Patient's Medications  New Prescriptions   INSULIN LISPRO PROTAMINE-LISPRO (HUMALOG 75/25) (75-25) 100 UNIT/ML SUSP    Inject 35 Units into the skin 2 (two) times  daily with a meal.  Previous Medications   AMLODIPINE (NORVASC) 5 MG TABLET    Take 5 mg by mouth daily.   ASPIRIN 325 MG TABLET    Take 1 tablet (325 mg total) by mouth daily.   ATORVASTATIN (LIPITOR) 10 MG TABLET    Take 1 tablet (10 mg total) by mouth daily.   COLLAGENASE (SANTYL) OINTMENT    Apply 1 application topically daily.   FEEDING SUPPLEMENT (PRO-STAT SUGAR FREE 64) LIQD    Take 30 mLs by mouth 2 (two) times daily with a meal.   FEXOFENADINE (ALLEGRA) 180 MG TABLET    Take 1 tablet (180 mg total) by mouth daily.   FLUTICASONE (FLONASE) 50 MCG/ACT NASAL SPRAY    Place 1 spray into the nose at bedtime. In each nostril   HYDRALAZINE (APRESOLINE) 50 MG TABLET    Take 75 mg by mouth 2 (two) times daily.   HYDROCHLOROTHIAZIDE (HYDRODIURIL) 25 MG TABLET    Take 25 mg by mouth daily.   HYDROCODONE-ACETAMINOPHEN (NORCO/VICODIN) 5-325 MG PER TABLET    Take 1 tablet by mouth every 6 (six) hours as needed for pain.   INSULIN LISPRO (HUMALOG) 100 UNIT/ML INJECTION    Inject 3 Units into the skin 3 (three) times daily before meals. If blood sugar is above 250   ISOSORBIDE DINITRATE (ISORDIL) 20 MG TABLET    Take 40 mg by mouth 2 (two) times daily.   LOSARTAN (COZAAR) 100 MG TABLET    Take 100 mg by mouth daily.   METOCLOPRAMIDE (REGLAN) 5 MG TABLET    Take 1 tablet (5 mg total) by mouth daily.   METOPROLOL SUCCINATE (TOPROL-XL) 25 MG 24 HR TABLET    Take 75 mg by mouth daily.   OMEPRAZOLE (PRILOSEC) 20 MG CAPSULE    Take 1 capsule (20 mg total) by mouth daily.   POTASSIUM CHLORIDE (MICRO-K) 10 MEQ CR CAPSULE    Take 10 mEq by mouth 2 (two) times daily.   SENNOSIDES-DOCUSATE SODIUM (SENOKOT-S) 8.6-50 MG TABLET    Take 1 tablet by mouth daily as needed for constipation.   WOUND DRESSINGS GEL    Apply 1 application topically daily.  Modified Medications   Modified Medication Previous Medication   INSULIN GLARGINE (LANTUS) 100 UNIT/ML INJECTION insulin glargine (LANTUS) 100 UNIT/ML injection       Inject 0.3 mLs (30 Units total) into the skin at bedtime.    Inject 0.2 mLs (20 Units total) into the skin at bedtime.  Discontinued Medications   AMLODIPINE (NORVASC) 10 MG TABLET    Take 1 tablet (10 mg total) by mouth daily.   CLONAZEPAM (KLONOPIN) 0.5 MG TABLET    Take 0.5 tablets (0.25 mg total) by mouth at bedtime as needed for anxiety.   GLUCOSE BLOOD (ACCU-CHEK AVIVA) TEST STRIP  Use as instructed. Check blood sugar four times a day    HYDROCHLOROTHIAZIDE (MICROZIDE) 12.5 MG CAPSULE    Take 2 capsules (25 mg total) by mouth daily.   INSULIN ASPART (NOVOLOG) 100 UNIT/ML INJECTION    Inject 0-9 Units into the skin every 4 (four) hours.   INSULIN ASPART (NOVOLOG) 100 UNIT/ML INJECTION    Inject 0-15 Units into the skin 3 (three) times daily with meals. CBG 70 - 120: 0 units CBG 121 - 150: 2 units CBG 151 - 200: 3 units CBG 201 - 250: 5 units CBG 251 - 300: 8 units CBG 301 - 350: 11 units CBG 351 - 400: 15 units   IRBESARTAN (AVAPRO) 300 MG TABLET    Take 1 tablet (300 mg total) by mouth daily.   ISOSORBIDE-HYDRALAZINE (BIDIL) 20-37.5 MG PER TABLET    Take 2 tablets by mouth 2 (two) times daily.   METOPROLOL SUCCINATE (TOPROL-XL) 50 MG 24 HR TABLET    Take 2 tablets (100 mg total) by mouth daily. Take with or immediately following a meal.   POTASSIUM CHLORIDE (K-DUR) 10 MEQ TABLET    Take 1 tablet (10 mEq total) by mouth 2 (two) times daily.   SENNA-DOCUSATE (SENOKOT-S) 8.6-50 MG PER TABLET    Take 1 tablet by mouth at bedtime as needed.     Physical Exam: Filed Vitals:   02/11/13 1645  BP: 116/64  Pulse: 69  Temp: 97.4 F (36.3 C)  Resp: 16  SpO2: 96%   General: in no acute distress but appears tired HEENT: Mucous membranes moist, no jvd, no LAD Heart: Regular rate and rhythm, no murmurs, rubs, gallops.   Lungs: Good air movement, clear to auscultation.   Abdomen: Soft, nontender, nondistended, positive bowel sounds.   Extremities:  No lower extremity edema. weakness in  all extremities but left >> right, has cast in left wrist area, able to move her digits Neuro: one word answers, oriented only to person, left sided weakness present  Labs reviewed: Basic Metabolic Panel:  Recent Labs  16/10/96 1351 02/01/13 1430  02/07/13 0653 02/08/13 0450 02/08/13 1120  NA 138 141  --  141  --  137  K 4.0 3.9  --  2.6*  --  3.5  CL 100 105  --  105  --  103  CO2 22  --   --  23  --  24  GLUCOSE 314* 322*  --  134*  --  254*  BUN 17 17  --  16  --  11  CREATININE 0.53 0.60  < > 0.55 0.50 0.46*  CALCIUM 9.8  --   --  9.0  --  9.2  < > = values in this interval not displayed. Liver Function Tests:  Recent Labs  01/29/13 1120 02/01/13 1351  AST 18 41*  ALT 17 23  ALKPHOS 116 163*  BILITOT 0.6 0.7  PROT 6.6 7.8  ALBUMIN 3.5 3.6   CBC:  Recent Labs  01/29/13 1120 02/01/13 1351 02/01/13 1430 02/01/13 1651  WBC 5.8 8.1  --  9.0  NEUTROABS 3.9 6.6  --   --   HGB 13.8 15.7* 16.3* 16.5*  HCT 40.8 45.4 48.0* 46.7*  MCV 88.5 87.0  --  85.5  PLT 168.0 183  --  184   Cardiac Enzymes:  Recent Labs  02/01/13 1651 02/01/13 2218 02/02/13 0420  CKTOTAL 2670*  --   --   CKMB 10.5*  --   --  TROPONINI <0.30 <0.30 <0.30   CBG:  Recent Labs  02/08/13 0406 02/08/13 0754 02/08/13 1133  GLUCAP 148* 188* 230*    Radiological Exams:  Procedures:    MRI of the brain 02/01/2013 1. Confluent acute infarct in the right PCA territory with occipital lobe mild cytotoxic edema. However, there are also numerous small acute infarcts in the left ACA, left PCA, and bilateral MCA territory. Constellation of findings is therefore most suggestive of acute embolic event. 2. No mass effect or hemorrhage.   MRA of the brain 02/01/2013 1. Occlusion or near occlusion of the right PCA in the distal P2/proximal P3 segment. 2. Nondominant distal left vertebral artery, might be occluded in the neck and reconstituted from the right vertebral artery in the posterior fossa.  Still, the absence of acute MRI changes in the posterior fossa suggest this is not of acute clinical significance. 3. Intracranial atherosclerosis but no other major circle Willis branch occlusion or hemodynamically significant intracranial stenosis.    2D Echocardiogram EF 60-65% with no source of embolus.    TEE   No PFO, no Thrombus, Preserved LV function.  Carotid Doppler No evidence of hemodynamically significant internal carotid artery stenosis. Vertebral artery flow is antegrade.   Consultations:  Neurology  Psychiatry  Cardiology (for TEE)  Assessment/Plan CVA- s/p new CVA. Here for rehabilitation and to work with PT/OT. Continue asa and lipitor. bp well controlled. Will not make changes to bp medications. Fall and pressure ulcer prophylaxis. On dysphagia diet. To take aspiration precautions. Will have her on neurochecks for now given her sleepiness. Check cbc and cmp as well. Patient with embolic stroke and residual weakness, has poor prognosis.   DM- elevated cbg today. Will provide 12 u novolog now for coverage and recheck cbg. Continue lantus and novolog for now. Monitor cbg and titrate insulin.   HTN- continue hctz, isordil and cozaar with toprol. Monitor bp  chf- no signs of fluid overload. Continue b blocker, ARB, hctz and isordil  Left scaphoid fracture- has a cast in place and on pain medications. Has not required pain meds recently. Monitor and to follow with orthopedics.  Labs/tests ordered- cbc, cmp, cbg

## 2013-02-11 NOTE — Telephone Encounter (Signed)
Pt called requesting to sched  stroke f/u visit. Called, no answer. Will send messge to Dr. Pearlean Brownie asst to schedule appt.

## 2013-02-13 ENCOUNTER — Ambulatory Visit (INDEPENDENT_AMBULATORY_CARE_PROVIDER_SITE_OTHER): Payer: Medicare Other | Admitting: Physician Assistant

## 2013-02-13 ENCOUNTER — Encounter: Payer: Self-pay | Admitting: Physician Assistant

## 2013-02-13 VITALS — BP 112/58 | HR 65 | Ht 63.0 in | Wt 164.0 lb

## 2013-02-13 DIAGNOSIS — I6350 Cerebral infarction due to unspecified occlusion or stenosis of unspecified cerebral artery: Secondary | ICD-10-CM

## 2013-02-13 DIAGNOSIS — I639 Cerebral infarction, unspecified: Secondary | ICD-10-CM

## 2013-02-13 DIAGNOSIS — I10 Essential (primary) hypertension: Secondary | ICD-10-CM

## 2013-02-13 DIAGNOSIS — E7800 Pure hypercholesterolemia, unspecified: Secondary | ICD-10-CM

## 2013-02-13 NOTE — Progress Notes (Signed)
[address], [address] [place], [place]  [address] Phone: [phone] Fax:  [phone]  Date:  02/13/2013   ID:  Malaka, Ruffner 1934-05-22, MRN [id number]  PCP:  Oliver Barre, MD  Primary Cardiologist:  None     History of Present Illness: [name] is a 79 y.o. female who is seen as a new patient today. She has a reported hx of CAD (but she denies this), DM2, HTN, HL.  Diastolic CHF is listed in her chart but she has normal LVF on echo with normal diastolic fxn.  She was recently admitted 4/18-4/25. She was initially brought to the hospital after being found unresponsive. MRI demonstrated acute infarct in the right PCA territory as well as numerous other small acute infarcts in the left ACA, left PCA and bilateral MCA territories all consistent with embolic CVA. Carotid Dopplers were negative for ICA stenosis. Echocardiogram 02/02/13: EF 60-65%, normal wall motion. TEE 4/23 (performed by Dr. Elease Hashimoto): EF 55-60%, mild AI, no LAA clot, normal valves. It was suggested the patient follow up with cardiology to undergo monitoring in order to rule out atrial fibrillation as a cause for embolic CVA.  Patient is here with 2 friends.  She has no family close by.  She has poor cognition and hx is provided some by the patient and some by her friends.  No reported hx of chest pain, syncope, dyspnea, orthopnea, PND, edema.    Labs (4/14):  K 3.5, Cr 0.46, ALT 23, LDL 216, Hgb 16.5, A1c 8.8, TSH 0.91  Wt Readings from Last 3 Encounters:  02/02/13 164 lb 10.9 oz (74.7 kg)  02/02/13 164 lb 10.9 oz (74.7 kg)  02/02/13 164 lb 10.9 oz (74.7 kg)     Past Medical History  Diagnosis Date  . DIABETES MELLITUS, TYPE I 05/30/2007  . HYPERCHOLESTEROLEMIA 10/23/2008  . HYPERTENSION 05/30/2007  . OSTEOARTHRITIS, LUMBAR SPINE 12/25/2007  . Depression 10/29/2011  . GERD (gastroesophageal reflux disease) 10/29/2011  . Diastolic CHF, chronic   . CAD (coronary artery disease)   . Anxiety problem     . Wedge compression fracture of T11-T12 vertebra   . Lumbar degenerative disc disease   . Anemia, unspecified   . Lumbar spinal stenosis   . Allergic rhinitis, cause unspecified 10/29/2011  . Osteoporosis 10/29/2011  . Failure to thrive in childhood 10/29/2011  . Osteoarthritis of right knee 10/29/2011  . History of colon polyps 11/01/2011  . Chronic pain 11/01/2011  . Cryptogenic stroke 01/2013    MRI demonstrated acute infarct in the right PCA territory as well as numerous other small acute infarcts in the left ACA, left PCA and bilateral MCA territories all consistent with embolic CVA. Carotid Dopplers were negative for ICA stenosis. Echocardiogram 02/02/13: EF 60-65%, normal wall motion. TEE 4/23 (performed by Dr. Elease Hashimoto): EF 55-60%, mild AI, no LAA clot, normal valves    Current Outpatient Prescriptions  Medication Sig Dispense Refill  . amLODipine (NORVASC) 10 MG tablet Take 1 tablet (10 mg total) by mouth daily.  30 tablet    . aspirin 325 MG tablet Take 1 tablet (325 mg total) by mouth daily.      Marland Kitchen atorvastatin (LIPITOR) 10 MG tablet Take 1 tablet (10 mg total) by mouth daily.  90 tablet  3  . fexofenadine (ALLEGRA) 180 MG tablet Take 1 tablet (180 mg total) by mouth daily.  90 tablet  3  . fluticasone (FLONASE) 50 MCG/ACT nasal spray Place 1 spray into the  nose at bedtime. In each nostril  16 g  5  . hydrochlorothiazide (MICROZIDE) 12.5 MG capsule Take 2 capsules (25 mg total) by mouth daily.      Marland Kitchen HYDROcodone-acetaminophen (NORCO/VICODIN) 5-325 MG per tablet Take 1 tablet by mouth every 6 (six) hours as needed for pain.  30 tablet  0  . insulin aspart (NOVOLOG) 100 UNIT/ML injection Inject 0-15 Units into the skin 3 (three) times daily with meals. CBG 70 - 120: 0 units CBG 121 - 150: 2 units CBG 151 - 200: 3 units CBG 201 - 250: 5 units CBG 251 - 300: 8 units CBG 301 - 350: 11 units CBG 351 - 400: 15 units  1 vial  0  . insulin glargine (LANTUS) 100 UNIT/ML injection Inject 0.2  mLs (20 Units total) into the skin at bedtime.  10 mL    . isosorbide-hydrALAZINE (BIDIL) 20-37.5 MG per tablet Take 2 tablets by mouth 2 (two) times daily.      Marland Kitchen losartan (COZAAR) 100 MG tablet Take 100 mg by mouth daily.      . metoCLOPramide (REGLAN) 5 MG tablet Take 1 tablet (5 mg total) by mouth daily.  30 tablet  0  . metoprolol succinate (TOPROL-XL) 50 MG 24 hr tablet Take 2 tablets (100 mg total) by mouth daily. Take with or immediately following a meal.  90 tablet  3  . omeprazole (PRILOSEC) 20 MG capsule Take 1 capsule (20 mg total) by mouth daily.  90 capsule  3  . potassium chloride (K-DUR) 10 MEQ tablet Take 1 tablet (10 mEq total) by mouth 2 (two) times daily.  30 tablet  0  . senna-docusate (SENOKOT-S) 8.6-50 MG per tablet Take 1 tablet by mouth at bedtime as needed.       No current facility-administered medications for this visit.    Allergies:    Allergies  Allergen Reactions  . Ace Inhibitors     unknown  . Baclofen     unknown  . Cefaclor     unknown  . Chlorzoxazone     unknown  . Cloxacillin     unknown  . Codeine     unknown  . Diclofenac Sodium     unknown  . Erythromycin     unknown  . Meperidine Hcl     unknown  . Nsaids     unknown  . Promethazine     unknown  . Propoxyphene-Acetaminophen     unknown  . Sulfonamide Derivatives     unknown    Social History:  The patient  reports that she has quit smoking. She does not have any smokeless tobacco history on file.   Family History:  The patient's family history includes Heart disease in her father.  There is no history of Diabetes.   ROS:  Please see the history of present illness.   Hx limited due to poor cognition.   No reports of melena, hematochezia, fever or cough.   PHYSICAL EXAM: VS:  BP 112/58  Pulse 65  Ht 5\' 3"  (1.6 m) Well nourished, well developed, in no acute distress HEENT: normal Neck: I cannot asses JVD Cardiac:  normal S1, S2; RRR; no murmur Lungs:  clear to  auscultation bilaterally, no wheezing, rhonchi or rales Abd: soft, nontender, no hepatomegaly Ext: no edema Skin: warm and dry Neuro:  R facial droop; L sided weakness noted.  EKG:  NSR, HR 65, LAD, PRWP, NSSTTW changes     ASSESSMENT AND PLAN:  1. S/p Embolic CVA:  I reviewed with Dr. Sherryl Manges (DOD) today.  Given the findings in the recent CRYSTAL-AF trial, patient would benefit from implantation of the [facility] loop recorder to assess for paroxysmal AFib.  If AFib is documented, she would need to start coumadin.  Dr. Graciela Husbands also saw the patient today.  Patient would like to think about whether or not she wants this implanted.  She will let us know what she decides.   2. Hypertension:  Controlled.  Continue current therapy.  3. Hyperlipidemia:  Continue statin. 4. Diabetes Mellitus:  F/u with PCP. 5. Disposition:  F/u PRN with Dr. Sherryl Manges.  Luna Glasgow, PA-C  12:26 PM 02/13/2013

## 2013-02-13 NOTE — Patient Instructions (Addendum)
Please let Dr. Graciela Husbands know if you are wanting to do the Loop Recorder

## 2013-02-20 ENCOUNTER — Non-Acute Institutional Stay (SKILLED_NURSING_FACILITY): Payer: Medicare Other | Admitting: Nurse Practitioner

## 2013-02-20 ENCOUNTER — Encounter: Payer: Self-pay | Admitting: Nurse Practitioner

## 2013-02-20 DIAGNOSIS — Z5189 Encounter for other specified aftercare: Secondary | ICD-10-CM

## 2013-02-20 DIAGNOSIS — I639 Cerebral infarction, unspecified: Secondary | ICD-10-CM

## 2013-02-20 DIAGNOSIS — L8995 Pressure ulcer of unspecified site, unstageable: Secondary | ICD-10-CM

## 2013-02-20 DIAGNOSIS — L89150 Pressure ulcer of sacral region, unstageable: Secondary | ICD-10-CM

## 2013-02-20 DIAGNOSIS — E1149 Type 2 diabetes mellitus with other diabetic neurological complication: Secondary | ICD-10-CM

## 2013-02-20 DIAGNOSIS — I6350 Cerebral infarction due to unspecified occlusion or stenosis of unspecified cerebral artery: Secondary | ICD-10-CM

## 2013-02-20 DIAGNOSIS — S31819D Unspecified open wound of right buttock, subsequent encounter: Secondary | ICD-10-CM

## 2013-02-20 DIAGNOSIS — L89109 Pressure ulcer of unspecified part of back, unspecified stage: Secondary | ICD-10-CM

## 2013-02-20 MED ORDER — INSULIN LISPRO 100 UNIT/ML ~~LOC~~ SOLN: 3.0000 [IU] | Freq: Three times a day (TID) | SUBCUTANEOUS | Status: DC

## 2013-02-20 MED ORDER — INSULIN LISPRO PROT & LISPRO (75-25 MIX) 100 UNIT/ML ~~LOC~~ SUSP: 35.0000 [IU] | Freq: Two times a day (BID) | SUBCUTANEOUS | Status: DC

## 2013-02-20 MED ORDER — INSULIN GLARGINE 100 UNIT/ML ~~LOC~~ SOLN: 30.0000 [IU] | Freq: Every day | SUBCUTANEOUS | Status: AC

## 2013-02-20 NOTE — Progress Notes (Signed)
  Subjective:    Patient ID: [name], female    DOB: 10-01-1934, 79 y.o.   MRN: [id number]  Diabetes She presents for her follow-up diabetic visit. She has type 2 diabetes mellitus. Her disease course has been fluctuating. There are no hypoglycemic associated symptoms. (She has unstageable ulcer to coccyx and right buttock. Being treated by treatment nurse.) There are no hypoglycemic complications. Diabetic complications include a CVA. Risk factors for coronary artery disease include diabetes mellitus, dyslipidemia, hypertension, post-menopausal and sedentary lifestyle. Current diabetic treatment includes insulin injections. She is currently taking insulin pre-breakfast, pre-lunch, pre-dinner and at bedtime. Insulin injections given by: nursing. Weight trend: 02/08/13: weight 164.0 lbs,  02/14/13: weight 156.7lbs. A 7.3 lb weight loss. Diabetic current diet: puree. She has had a previous visit with a dietician. She rarely participates in exercise. Her home blood glucose trend is fluctuating dramatically. Her breakfast blood glucose is taken between 7-8 am. Her breakfast blood glucose range is generally 180-200 mg/dl. Her lunch blood glucose is taken between 11-12 pm. Her lunch blood glucose range is generally >200 mg/dl. Her dinner blood glucose is taken between 4-5 pm. Her dinner blood glucose range is generally >200 mg/dl. Her highest blood glucose is >200 mg/dl. Her overall blood glucose range is >200 mg/dl. An ACE inhibitor/angiotensin II receptor blocker is contraindicated.      Review of Systems  Unable to perform ROS      Objective:   Physical Exam  Constitutional: She appears well-developed and well-nourished. No distress.  Eyes: Pupils are equal, round, and reactive to light.  Cardiovascular: Normal rate and regular rhythm.   Saw  cardiology on 02/13/13. Discussion for loop recorder to assess for afib was done, pt is to decide when she is ready.  Pulmonary/Chest: Effort normal  and breath sounds normal.  Musculoskeletal:  Left hemiparesis  Neurological: She is alert.  Left hemiparesis  Skin: Skin is warm and dry. She is not diaphoretic.  Psychiatric: She has a normal mood and affect.          Assessment & Plan:  Diabetes type 2 with elevated cbg's; right buttock and coccyx unstageable ulcers. HGB a1c 9.1 on 02/19/13. 1) Start Humalog Lispro 75/25. Give 35 units q 8 am with breakfast and 35 units q 5pm with dinner. 2) d/c meal coverage, d/c sliding scale. 3) for cbg > 250 give 3 units of Humalog. 4) check cbg ac/hs x 5 days then tid. 5) treatment nurse to continue care of coccyx and right buttock ulcer.

## 2013-02-25 ENCOUNTER — Telehealth: Payer: Self-pay | Admitting: *Deleted

## 2013-02-25 NOTE — Telephone Encounter (Deleted)
Patient phone was not reachable, he is a np to the practice and needs a np appointment. thanks

## 2013-02-26 ENCOUNTER — Ambulatory Visit: Payer: Medicare Other | Admitting: Internal Medicine

## 2013-02-26 DIAGNOSIS — Z0289 Encounter for other administrative examinations: Secondary | ICD-10-CM

## 2013-03-01 ENCOUNTER — Emergency Department (HOSPITAL_COMMUNITY): Payer: Medicare Other

## 2013-03-01 ENCOUNTER — Encounter (HOSPITAL_COMMUNITY): Payer: Self-pay

## 2013-03-01 ENCOUNTER — Encounter: Payer: Self-pay | Admitting: Nurse Practitioner

## 2013-03-01 ENCOUNTER — Emergency Department (HOSPITAL_COMMUNITY)
Admission: EM | Admit: 2013-03-01 | Discharge: 2013-03-01 | Disposition: A | Discharge status: Skilled Nursing Facility | Payer: Medicare Other | Attending: Emergency Medicine | Admitting: Emergency Medicine

## 2013-03-01 DIAGNOSIS — E7800 Pure hypercholesterolemia, unspecified: Secondary | ICD-10-CM | POA: Insufficient documentation

## 2013-03-01 DIAGNOSIS — Z87891 Personal history of nicotine dependence: Secondary | ICD-10-CM | POA: Insufficient documentation

## 2013-03-01 DIAGNOSIS — Z8673 Personal history of transient ischemic attack (TIA), and cerebral infarction without residual deficits: Secondary | ICD-10-CM | POA: Insufficient documentation

## 2013-03-01 DIAGNOSIS — Z8781 Personal history of (healed) traumatic fracture: Secondary | ICD-10-CM | POA: Insufficient documentation

## 2013-03-01 DIAGNOSIS — Z79899 Other long term (current) drug therapy: Secondary | ICD-10-CM | POA: Insufficient documentation

## 2013-03-01 DIAGNOSIS — Z860100 Personal history of colon polyps, unspecified: Secondary | ICD-10-CM | POA: Insufficient documentation

## 2013-03-01 DIAGNOSIS — I2510 Atherosclerotic heart disease of native coronary artery without angina pectoris: Secondary | ICD-10-CM | POA: Insufficient documentation

## 2013-03-01 DIAGNOSIS — Z8739 Personal history of other diseases of the musculoskeletal system and connective tissue: Secondary | ICD-10-CM | POA: Insufficient documentation

## 2013-03-01 DIAGNOSIS — M810 Age-related osteoporosis without current pathological fracture: Secondary | ICD-10-CM | POA: Insufficient documentation

## 2013-03-01 DIAGNOSIS — F0390 Unspecified dementia without behavioral disturbance: Secondary | ICD-10-CM | POA: Insufficient documentation

## 2013-03-01 DIAGNOSIS — Y939 Activity, unspecified: Secondary | ICD-10-CM | POA: Insufficient documentation

## 2013-03-01 DIAGNOSIS — M1990 Unspecified osteoarthritis, unspecified site: Secondary | ICD-10-CM | POA: Insufficient documentation

## 2013-03-01 DIAGNOSIS — Z7982 Long term (current) use of aspirin: Secondary | ICD-10-CM | POA: Insufficient documentation

## 2013-03-01 DIAGNOSIS — M1710 Unilateral primary osteoarthritis, unspecified knee: Secondary | ICD-10-CM | POA: Insufficient documentation

## 2013-03-01 DIAGNOSIS — Z794 Long term (current) use of insulin: Secondary | ICD-10-CM | POA: Insufficient documentation

## 2013-03-01 DIAGNOSIS — K219 Gastro-esophageal reflux disease without esophagitis: Secondary | ICD-10-CM | POA: Insufficient documentation

## 2013-03-01 DIAGNOSIS — E109 Type 1 diabetes mellitus without complications: Secondary | ICD-10-CM | POA: Insufficient documentation

## 2013-03-01 DIAGNOSIS — G8929 Other chronic pain: Secondary | ICD-10-CM | POA: Insufficient documentation

## 2013-03-01 DIAGNOSIS — I10 Essential (primary) hypertension: Secondary | ICD-10-CM | POA: Insufficient documentation

## 2013-03-01 DIAGNOSIS — S0001XA Abrasion of scalp, initial encounter: Secondary | ICD-10-CM

## 2013-03-01 DIAGNOSIS — W19XXXA Unspecified fall, initial encounter: Secondary | ICD-10-CM

## 2013-03-01 DIAGNOSIS — Z880 Allergy status to penicillin: Secondary | ICD-10-CM | POA: Insufficient documentation

## 2013-03-01 DIAGNOSIS — I5032 Chronic diastolic (congestive) heart failure: Secondary | ICD-10-CM | POA: Insufficient documentation

## 2013-03-01 DIAGNOSIS — IMO0002 Reserved for concepts with insufficient information to code with codable children: Secondary | ICD-10-CM | POA: Insufficient documentation

## 2013-03-01 DIAGNOSIS — Z862 Personal history of diseases of the blood and blood-forming organs and certain disorders involving the immune mechanism: Secondary | ICD-10-CM | POA: Insufficient documentation

## 2013-03-01 DIAGNOSIS — Z8659 Personal history of other mental and behavioral disorders: Secondary | ICD-10-CM | POA: Insufficient documentation

## 2013-03-01 DIAGNOSIS — W1809XA Striking against other object with subsequent fall, initial encounter: Secondary | ICD-10-CM | POA: Insufficient documentation

## 2013-03-01 DIAGNOSIS — Y9210 Unspecified residential institution as the place of occurrence of the external cause: Secondary | ICD-10-CM | POA: Insufficient documentation

## 2013-03-01 MED ORDER — TETANUS-DIPHTH-ACELL PERTUSSIS 5-2.5-18.5 LF-MCG/0.5 IM SUSP: 0.5000 mL | Freq: Once | INTRAMUSCULAR | Status: DC

## 2013-03-01 NOTE — ED Provider Notes (Signed)
History     CSN: [id number]  Arrival date & time 03/01/13  1211   First MD Initiated Contact with Patient 03/01/13 1217      Chief Complaint  Patient presents with  . Fall    The history is provided by the EMS personnel, a relative and the nursing home. The history is limited by the condition of the patient (Hx dementia).  Pt was seen at 1220.  Per EMS, NH report and pt's family: pt s/p one episode of fall at the [facility].  NH states pt hit her head when she fell. No reported LOC. Pt has a significant Hx of confusion and "often tries to get up to walk on her own when she knows she cannot" due to hx CVA, per multiple family members at pt's bedside. Pt states she feels "ok" and denies any specific complaints.     Past Medical History  Diagnosis Date  . DIABETES MELLITUS, TYPE I 05/30/2007  . HYPERCHOLESTEROLEMIA 10/23/2008  . HYPERTENSION 05/30/2007  . OSTEOARTHRITIS, LUMBAR SPINE 12/25/2007  . Depression 10/29/2011  . GERD (gastroesophageal reflux disease) 10/29/2011  . Diastolic CHF, chronic   . CAD (coronary artery disease)   . Anxiety problem   . Wedge compression fracture of T11-T12 vertebra   . Lumbar degenerative disc disease   . Anemia, unspecified   . Lumbar spinal stenosis   . Allergic rhinitis, cause unspecified 10/29/2011  . Osteoporosis 10/29/2011  . Failure to thrive in childhood 10/29/2011  . Osteoarthritis of right knee 10/29/2011  . History of colon polyps 11/01/2011  . Chronic pain 11/01/2011  . Cryptogenic stroke 01/2013    MRI demonstrated acute infarct in the right PCA territory as well as numerous other small acute infarcts in the left ACA, left PCA and bilateral MCA territories all consistent with embolic CVA. Carotid Dopplers were negative for ICA stenosis. Echocardiogram 02/02/13: EF 60-65%, normal wall motion. TEE 4/23 (performed by Dr. Elease Hashimoto): EF 55-60%, mild AI, no LAA clot, normal valves    Past Surgical History  Procedure Laterality Date  . Abdominal  hysterectomy    . Cholecystectomy    . Appendectomy    . Back surgury    . Tee without cardioversion N/A 02/06/2013    Procedure: TRANSESOPHAGEAL ECHOCARDIOGRAM (TEE);  Surgeon: Vesta Mixer, MD;  Location: [facility] ENDOSCOPY;  Service: Cardiovascular;  Laterality: N/A;    Family History  Problem Relation Age of Onset  . Diabetes Neg Hx     No DM in her immdidate family  . Heart disease Father     History  Substance Use Topics  . Smoking status: Former Games developer  . Smokeless tobacco: Not on file  . Alcohol Use: Not on file      Review of Systems  Unable to perform ROS: Dementia    Allergies  Ace inhibitors; Baclofen; Cefaclor; Chlorzoxazone; Cloxacillin; Codeine; Diclofenac sodium; Erythromycin; Meperidine hcl; Nsaids; Promethazine; Propoxyphene-acetaminophen; and Sulfonamide derivatives  Home Medications   Current Outpatient Rx  Name  Route  Sig  Dispense  Refill  . amLODipine (NORVASC) 5 MG tablet   Oral   Take 5 mg by mouth daily.         Marland Kitchen aspirin 325 MG tablet   Oral   Take 1 tablet (325 mg total) by mouth daily.         Marland Kitchen atorvastatin (LIPITOR) 10 MG tablet   Oral   Take 1 tablet (10 mg total) by mouth daily.   90 tablet  3   . collagenase (SANTYL) ointment   Topical   Apply 1 application topically daily.         . feeding supplement (PRO-STAT SUGAR FREE 64) LIQD   Oral   Take 30 mLs by mouth 2 (two) times daily with a meal.         . fexofenadine (ALLEGRA) 180 MG tablet   Oral   Take 1 tablet (180 mg total) by mouth daily.   90 tablet   3   . fluticasone (FLONASE) 50 MCG/ACT nasal spray   Nasal   Place 1 spray into the nose at bedtime. In each nostril   16 g   5   . hydrALAZINE (APRESOLINE) 50 MG tablet   Oral   Take 75 mg by mouth 2 (two) times daily.         . hydrochlorothiazide (HYDRODIURIL) 25 MG tablet   Oral   Take 25 mg by mouth daily.         Marland Kitchen HYDROcodone-acetaminophen (NORCO/VICODIN) 5-325 MG per tablet   Oral    Take 1 tablet by mouth every 6 (six) hours as needed for pain.   30 tablet   0   . insulin glargine (LANTUS) 100 UNIT/ML injection   Subcutaneous   Inject 0.3 mLs (30 Units total) into the skin at bedtime.   10 mL      . insulin lispro (HUMALOG) 100 UNIT/ML injection   Subcutaneous   Inject 3 Units into the skin 3 (three) times daily before meals. If blood sugar is above 250         . insulin lispro protamine-lispro (HUMALOG 75/25) (75-25) 100 UNIT/ML SUSP   Subcutaneous   Inject 35 Units into the skin 2 (two) times daily with a meal.   10 mL   12   . isosorbide dinitrate (ISORDIL) 20 MG tablet   Oral   Take 40 mg by mouth 2 (two) times daily.         Marland Kitchen losartan (COZAAR) 100 MG tablet   Oral   Take 100 mg by mouth daily.         . metoCLOPramide (REGLAN) 5 MG tablet   Oral   Take 1 tablet (5 mg total) by mouth daily.   30 tablet   0   . metoprolol succinate (TOPROL-XL) 25 MG 24 hr tablet   Oral   Take 75 mg by mouth daily.         Marland Kitchen omeprazole (PRILOSEC) 20 MG capsule   Oral   Take 1 capsule (20 mg total) by mouth daily.   90 capsule   3   . potassium chloride (MICRO-K) 10 MEQ CR capsule   Oral   Take 10 mEq by mouth 2 (two) times daily.         . sennosides-docusate sodium (SENOKOT-S) 8.6-50 MG tablet   Oral   Take 1 tablet by mouth daily as needed for constipation.         Marland Kitchen wound dressings gel   Topical   Apply 1 application topically daily.           BP 141/67  Pulse 87  Temp(Src) 97.5 F (36.4 C) (Oral)  Resp 16  SpO2 97%  Physical Exam 1225: Physical examination:  Nursing notes reviewed; Vital signs and O2 SAT reviewed;  Constitutional: Well developed, Well nourished, In no acute distress; Head:  Normocephalic, +very superficial hemostatic abrasion to left parietal scalp.; Eyes: EOMI, PERRL, No scleral icterus; ENMT:  Mouth and pharynx normal, Mucous membranes dry; Neck: Supple, Full range of motion, No lymphadenopathy;  Cardiovascular: Regular rate and rhythm, No gallop; Respiratory: Breath sounds clear & equal bilaterally, No rales, rhonchi, wheezes.  Speaking full sentences with ease, Normal respiratory effort/excursion; Chest: Nontender, Movement normal; Abdomen: Soft, Nontender, Nondistended, Normal bowel sounds; Genitourinary: No CVA tenderness; Spine:  No midline CS, TS, LS tenderness.;; Extremities: Pulses normal, Pelvis stable. No deformity. No tenderness, No edema, No calf edema or asymmetry.; Neuro: Awake, alert, confused re: time, place, events, per hx. Speech clear. Moves all ext spontaneously without apparent gross focal motor deficit.; Skin: Color normal, Warm, Dry.   ED Course  Procedures    MDM  MDM Reviewed: previous chart, nursing note and vitals Reviewed previous: ECG Interpretation: x-ray, CT scan and ECG     Date: 03/01/2013  Rate: 88  Rhythm: normal sinus rhythm and premature ventricular contractions (PVC)  QRS Axis: left  Intervals: normal  ST/T Wave abnormalities: normal  Conduction Disutrbances:none  Narrative Interpretation:   Old EKG Reviewed: unchanged; no significant changes from previous EKG dated 02/01/2013.   Dg Pelvis 1-2 Views 03/01/2013   *RADIOLOGY REPORT*  Clinical Data: Fall 1 day ago.  Pain.  PELVIS - 1-2 VIEW  Comparison: Plain films of the hips 01/29/2013.  Findings: Both hips are located.  No fracture is identified.  There is mild to moderate degenerative disease about the hips.  The patient is status post lower lumbar fusion.  IMPRESSION: No acute finding.  Stable compared to prior exam.   Original Report Authenticated By: Holley Dexter, M.D.   Ct Head Wo Contrast 03/01/2013   *RADIOLOGY REPORT*  Clinical Data:  Larey Seat and hit head.  Dementia.  CT HEAD WITHOUT CONTRAST CT CERVICAL SPINE WITHOUT CONTRAST  Technique:  Multidetector CT imaging of the head and cervical spine was performed following the standard protocol without intravenous contrast.  Multiplanar CT  image reconstructions of the cervical spine were also generated.  Comparison:  02/01/2013  CT HEAD  Findings: Moderate atrophy.  Chronic infarct right occipital lobe. Mild chronic microvascular ischemic change in the white matter.  Negative for acute or infarct, hemorrhage, or mass.  Right parietal scalp hematoma.  Negative for skull fracture.  IMPRESSION: Atrophy and chronic ischemia.  No acute abnormality.  CT CERVICAL SPINE  Findings: Straightening of the cervical lordosis.  Normal alignment and no fracture.  Disc degeneration and spondylosis at C4-5 and C5- 6.  Negative for mass lesion.  IMPRESSION: Negative for fracture.   Original Report Authenticated By: Janeece Riggers, M.D.   Ct Cervical Spine Wo Contrast 03/01/2013   *RADIOLOGY REPORT*  Clinical Data:  Larey Seat and hit head.  Dementia.  CT HEAD WITHOUT CONTRAST CT CERVICAL SPINE WITHOUT CONTRAST  Technique:  Multidetector CT imaging of the head and cervical spine was performed following the standard protocol without intravenous contrast.  Multiplanar CT image reconstructions of the cervical spine were also generated.  Comparison:  02/01/2013  CT HEAD  Findings: Moderate atrophy.  Chronic infarct right occipital lobe. Mild chronic microvascular ischemic change in the white matter.  Negative for acute or infarct, hemorrhage, or mass.  Right parietal scalp hematoma.  Negative for skull fracture.  IMPRESSION: Atrophy and chronic ischemia.  No acute abnormality.  CT CERVICAL SPINE  Findings: Straightening of the cervical lordosis.  Normal alignment and no fracture.  Disc degeneration and spondylosis at C4-5 and C5- 6.  Negative for mass lesion.  IMPRESSION: Negative for fracture.   Original Report Authenticated  By: Janeece Riggers, M.D.    1630:  Scalp wound is very superficial abrasion, no need for sutures at this time.  Will send back to NH; family agreeable with this plan. Dx and testing d/w pt and family.  Questions answered.  Verb understanding, agreeable to d/c  home with outpt f/u.          Laray Anger, DO 03/03/13 2241

## 2013-03-01 NOTE — ED Notes (Addendum)
Report given to RN at [facility]. 

## 2013-03-01 NOTE — ED Notes (Signed)
Placed call for transport back to [facility]

## 2013-03-01 NOTE — ED Notes (Signed)
NAD noted at time of d/c with PTAR to [facility]

## 2013-03-01 NOTE — ED Notes (Signed)
Per ems- pt from [facility], had unwitnessed fall, hit head, no LOC. Pt at neuro baseline, hx of dementia. PMS in all extremities, denies pain, 3cm lac noted to back of head, bleeding controlled. BP-130/80 HR-94, regular RR-16 O2-97% on RA. CBG-231

## 2013-03-11 DIAGNOSIS — S62002A Unspecified fracture of navicular [scaphoid] bone of left wrist, initial encounter for closed fracture: Secondary | ICD-10-CM | POA: Insufficient documentation

## 2013-03-11 DIAGNOSIS — E1165 Type 2 diabetes mellitus with hyperglycemia: Secondary | ICD-10-CM | POA: Insufficient documentation

## 2013-05-22 ENCOUNTER — Encounter: Payer: Self-pay | Admitting: Adult Health

## 2013-05-22 ENCOUNTER — Non-Acute Institutional Stay (SKILLED_NURSING_FACILITY): Payer: Medicare Other | Admitting: Adult Health

## 2013-05-22 DIAGNOSIS — E876 Hypokalemia: Secondary | ICD-10-CM

## 2013-05-22 DIAGNOSIS — I10 Essential (primary) hypertension: Secondary | ICD-10-CM

## 2013-05-22 DIAGNOSIS — F329 Major depressive disorder, single episode, unspecified: Secondary | ICD-10-CM

## 2013-05-22 DIAGNOSIS — I2510 Atherosclerotic heart disease of native coronary artery without angina pectoris: Secondary | ICD-10-CM

## 2013-05-22 DIAGNOSIS — E7800 Pure hypercholesterolemia, unspecified: Secondary | ICD-10-CM

## 2013-05-22 DIAGNOSIS — E1165 Type 2 diabetes mellitus with hyperglycemia: Secondary | ICD-10-CM

## 2013-05-22 DIAGNOSIS — K219 Gastro-esophageal reflux disease without esophagitis: Secondary | ICD-10-CM

## 2013-05-22 DIAGNOSIS — J309 Allergic rhinitis, unspecified: Secondary | ICD-10-CM

## 2013-05-22 DIAGNOSIS — E118 Type 2 diabetes mellitus with unspecified complications: Secondary | ICD-10-CM

## 2013-05-22 NOTE — Progress Notes (Signed)
Patient ID: [name], female   DOB: 12-20-1933, 79 y.o.   MRN: [id number]  [facility]  Allergies  Allergen Reactions  . Ace Inhibitors     unknown  . Baclofen     unknown  . Cefaclor     unknown  . Chlorzoxazone     unknown  . Cloxacillin     unknown  . Codeine     unknown  . Diclofenac Sodium     unknown  . Erythromycin     unknown  . Meperidine Hcl     unknown  . Nsaids     unknown  . Promethazine     unknown  . Propoxyphene-Acetaminophen     unknown  . Sulfonamide Derivatives     unknown      Chief Complaint  Patient presents with  . Medical Managment of Chronic Issues    HPI: She is being seen for the management of her chronic illnesses; there are no concerns being voiced by the nursing staff at this time. She is unable to fully participate in the hpi or ros  Past Medical History  Diagnosis Date  . DIABETES MELLITUS, TYPE I 05/30/2007  . HYPERCHOLESTEROLEMIA 10/23/2008  . HYPERTENSION 05/30/2007  . OSTEOARTHRITIS, LUMBAR SPINE 12/25/2007  . Depression 10/29/2011  . GERD (gastroesophageal reflux disease) 10/29/2011  . Diastolic CHF, chronic   . CAD (coronary artery disease)   . Anxiety problem   . Wedge compression fracture of T11-T12 vertebra   . Lumbar degenerative disc disease   . Anemia, unspecified   . Lumbar spinal stenosis   . Allergic rhinitis, cause unspecified 10/29/2011  . Osteoporosis 10/29/2011  . Failure to thrive in childhood 10/29/2011  . Osteoarthritis of right knee 10/29/2011  . History of colon polyps 11/01/2011  . Chronic pain 11/01/2011  . Cryptogenic stroke 01/2013    MRI demonstrated acute infarct in the right PCA territory as well as numerous other small acute infarcts in the left ACA, left PCA and bilateral MCA territories all consistent with embolic CVA. Carotid Dopplers were negative for ICA stenosis. Echocardiogram 02/02/13: EF 60-65%, normal wall motion. TEE 4/23 (performed by Dr. Elease Hashimoto): EF 55-60%, mild AI, no LAA clot,  normal valves    Past Surgical History  Procedure Laterality Date  . Abdominal hysterectomy    . Cholecystectomy    . Appendectomy    . Back surgury    . Tee without cardioversion N/A 02/06/2013    Procedure: TRANSESOPHAGEAL ECHOCARDIOGRAM (TEE);  Surgeon: Vesta Mixer, MD;  Location: [facility] ENDOSCOPY;  Service: Cardiovascular;  Laterality: N/A;    VITAL SIGNS BP 119/64  Pulse 68  Ht 5\' 3"  (1.6 m)  Wt 137 lb 14.4 oz (62.551 kg)  BMI 24.43 kg/m2   Patient's Medications  New Prescriptions   No medications on file  Previous Medications   AMLODIPINE (NORVASC) 5 MG TABLET    Take 5 mg by mouth daily.   ASPIRIN 325 MG TABLET    Take 1 tablet (325 mg total) by mouth daily.   ATORVASTATIN (LIPITOR) 10 MG TABLET    Take 1 tablet (10 mg total) by mouth daily.   CLONAZEPAM (KLONOPIN) 0.5 MG TABLET    Take 0.25 mg by mouth 2 (two) times daily as needed for anxiety.   FEXOFENADINE (ALLEGRA) 180 MG TABLET    Take 1 tablet (180 mg total) by mouth daily.   FLUTICASONE (FLONASE) 50 MCG/ACT NASAL SPRAY    Place 1 spray into the nose at  bedtime. In each nostril   HYDRALAZINE (APRESOLINE) 50 MG TABLET    Take 50 mg by mouth 2 (two) times daily.    HYDROCHLOROTHIAZIDE (HYDRODIURIL) 25 MG TABLET    Take 25 mg by mouth daily.   HYDROCODONE-ACETAMINOPHEN (NORCO/VICODIN) 5-325 MG PER TABLET    Take 1 tablet by mouth every 6 (six) hours as needed for pain.   INSULIN GLARGINE (LANTUS) 100 UNIT/ML INJECTION    Inject 0.3 mLs (30 Units total) into the skin at bedtime.   INSULIN LISPRO (HUMALOG) 100 UNIT/ML INJECTION    Inject 3 Units into the skin 3 (three) times daily before meals. If blood sugar is above 250   ISOSORBIDE DINITRATE (ISORDIL) 20 MG TABLET    Take 40 mg by mouth 2 (two) times daily.   LOSARTAN (COZAAR) 100 MG TABLET    Take 100 mg by mouth daily.   METOCLOPRAMIDE (REGLAN) 5 MG TABLET    Take 1 tablet (5 mg total) by mouth daily.   METOPROLOL SUCCINATE (TOPROL-XL) 25 MG 24 HR TABLET    Take 50  mg by mouth daily.    OMEPRAZOLE (PRILOSEC) 20 MG CAPSULE    Take 1 capsule (20 mg total) by mouth daily.   POTASSIUM CHLORIDE (MICRO-K) 10 MEQ CR CAPSULE    Take 10 mEq by mouth 2 (two) times daily.   SENNOSIDES-DOCUSATE SODIUM (SENOKOT-S) 8.6-50 MG TABLET    Take 1 tablet by mouth daily as needed for constipation.   SERTRALINE (ZOLOFT) 50 MG TABLET    Take 50 mg by mouth daily.  Modified Medications   Modified Medication Previous Medication   INSULIN LISPRO PROTAMINE-LISPRO (HUMALOG 75/25) (75-25) 100 UNIT/ML SUSP INJECTION insulin lispro protamine-lispro (HUMALOG 75/25) (75-25) 100 UNIT/ML SUSP      Inject 30-35 Units into the skin 2 (two) times daily with a meal. 30 units in the AM and 35 units in the PM    Inject 35 Units into the skin 2 (two) times daily with a meal.  Discontinued Medications   COLLAGENASE (SANTYL) OINTMENT    Apply 1 application topically daily.   FEEDING SUPPLEMENT (PRO-STAT SUGAR FREE 64) LIQD    Take 30 mLs by mouth 2 (two) times daily with a meal.   WOUND DRESSINGS GEL    Apply 1 application topically daily.    SIGNIFICANT DIAGNOSTIC EXAMS  03-01-13: ct of head: Atrophy and chronic ischemia.  No acute abnormality.  03-01-13: ct of cervical spine:  Negative for fracture.  03-01-13: pelvic x-ray: No acute finding.  Stable compared to prior exam.    LABS REVIEWED:   01-29-13: tsh 0.91 02-01-13: wbc 9.0; hgb 16.5; hct 46.7; mcv 85.5 ;plt 184 02-08-13: glucose 254; bun 11; creat 0.46; k+3.5; na++137 02-13-13: wbc 9.7; hgb 14.7; hct 43.8; mcv 88.8; plt 274; glucose 241; bun 32; creat 0.94; k+4.7 Na++137; chol 148; ldl 98; trig 131; hgb a1c 8.2  02-19-13: glucose 154; bun 18; creat 0.64; k+4.4; na++131 hgb a1c 9.1 03-09-13: urine culture: p.mirabilis: gentamycin 04-23-13; Hgb a1c 7.4    Review of Systems  Unable to perform ROS   Physical Exam  Constitutional: She appears well-developed and well-nourished.  thin  Neck: Neck supple. No JVD present. No thyromegaly  present.  Cardiovascular: Normal rate, regular rhythm and intact distal pulses.   Respiratory: Effort normal and breath sounds normal. No respiratory distress. She has no wheezes.  GI: Soft. Bowel sounds are normal. She exhibits no distension. There is no tenderness.  Musculoskeletal: She exhibits no edema.  Neurological: She is alert.  Skin: Skin is warm.       ASSESSMENT/ PLAN:  GERD (gastroesophageal reflux disease) Will continue prilosec 20 mg daily and reglan 5 mg daily will monitor   HYPERTENSION Will continue cozaar 100 mg daily; norvasc 5 mg daily; toprol xl 50 mg daily; hctz 25 mg daily  and apresoline 50 mg twice daily and will monitor   HYPERCHOLESTEROLEMIA Will continue lipitor 10 mg daily and will monitor   Depression Is stable will continue zoloft 50 mg daily and klonopin 0.25 mg twice daily as needed for anxiety and will monitor   Hypokalemia Will continue k+ 10 meq twice daily   CAD (coronary artery disease) Is presently stable will continue her isordil 40 mg twice daily; asa 325 mg daily and will monitor  Allergic rhinitis, cause unspecified Will continue her allegra 180 mg daily and flonase nightly   Diabetes mellitus type 2 with complications, uncontrolled Will continue her lantus 30 units nightly; humalog mix 75/25: 30 units in the AM and 35 units in the PM with humalog 3 units prior to meals for cbg >250; will monitor her status      Time spent with patient 50 minutes

## 2013-05-22 NOTE — Assessment & Plan Note (Signed)
Will continue prilosec 20 mg daily and reglan 5 mg daily will monitor

## 2013-05-22 NOTE — Assessment & Plan Note (Signed)
Is presently stable will continue her isordil 40 mg twice daily; asa 325 mg daily and will monitor

## 2013-05-22 NOTE — Assessment & Plan Note (Addendum)
Will continue cozaar 100 mg daily; norvasc 5 mg daily; toprol xl 50 mg daily; hctz 25 mg daily  and apresoline 50 mg twice daily and will monitor

## 2013-05-22 NOTE — Assessment & Plan Note (Signed)
Will continue her lantus 30 units nightly; humalog mix 75/25: 30 units in the AM and 35 units in the PM with humalog 3 units prior to meals for cbg >250; will monitor her status

## 2013-05-22 NOTE — Assessment & Plan Note (Signed)
Will continue her allegra 180 mg daily and flonase nightly

## 2013-05-22 NOTE — Assessment & Plan Note (Signed)
Will continue lipitor 10 mg daily and will monitor  

## 2013-05-22 NOTE — Assessment & Plan Note (Signed)
Is stable will continue zoloft 50 mg daily and klonopin 0.25 mg twice daily as needed for anxiety and will monitor

## 2013-05-22 NOTE — Assessment & Plan Note (Signed)
Will continue k+ 10 meq twice daily

## 2013-05-30 ENCOUNTER — Non-Acute Institutional Stay (SKILLED_NURSING_FACILITY): Payer: Medicare Other | Admitting: Adult Health

## 2013-05-30 DIAGNOSIS — N3090 Cystitis, unspecified without hematuria: Secondary | ICD-10-CM

## 2013-06-03 ENCOUNTER — Non-Acute Institutional Stay (SKILLED_NURSING_FACILITY): Payer: Medicare Other | Admitting: Internal Medicine

## 2013-06-03 DIAGNOSIS — N3090 Cystitis, unspecified without hematuria: Secondary | ICD-10-CM | POA: Insufficient documentation

## 2013-06-03 DIAGNOSIS — G9340 Encephalopathy, unspecified: Secondary | ICD-10-CM

## 2013-06-03 NOTE — Progress Notes (Signed)
Patient ID: [name], female   DOB: November 09, 1933, 79 y.o.   MRN: [id number]  Chief Complaint  Patient presents with  . Acute Visit    [facility]  Allergies  Allergen Reactions  . Ace Inhibitors     unknown  . Baclofen     unknown  . Cefaclor     unknown  . Chlorzoxazone     unknown  . Cloxacillin     unknown  . Codeine     unknown  . Diclofenac Sodium     unknown  . Erythromycin     unknown  . Meperidine Hcl     unknown  . Nsaids     unknown  . Promethazine     unknown  . Propoxyphene-Acetaminophen     unknown  . Sulfonamide Derivatives     unknown    HPI- 79 y/o female patient is a long term care resident and was seen today with her confusion and dirty U/A result. She has been confused and has behavioral changes from 05/31/13. Her u/a was sent and has grown e.coli.  No fever or chills reported by staff Pt is confused to provide any ROS Her po intake has decreased No chest pain or dypnea reported No falls reported  Ros- unable to obtain from patient  Physical exam  Vital signs stable, afebrile  Constitutional: She appears well-developed and well-nourished.  thin  Neck: Neck supple. No JVD present. No thyromegaly present.  Cardiovascular: Normal rate, regular rhythm and intact distal pulses.   Respiratory: Effort normal and breath sounds normal. No respiratory distress. She has no wheezes.  GI: Soft. Bowel sounds are normal. She exhibits no distension. There is no CVA or suprapubic  tenderness.  Musculoskeletal: She exhibits no edema.  Neurological: She is confused, oriented only to self Skin: Skin is warm.   A/P  Encephalopathy In setting of e.coli UTI. Reviewed culture/ sensitivity report and her allergy list. No documentation on type of allergic reaction to PCN, 2nd generation cephalosporin, erythromycin and sulfa drugs. Only medication that she is sensitive to and not allergic to is levofloxacin. The sensitivity to this drug is < 0.12. Will  have her on levofloxacin 750 mg po daily for 5 days with florastor for now. Monitor wbc and temp curve. Will also check her cbc with diff and renal function to rule out metabolic causes for encephalopathy  UTI Complicated with multi drug resistance. Will have her on levofloxacin for 5 days and check wbc and temp curve. Encourage hydration. Monitor blood sugar closely

## 2013-06-10 NOTE — Progress Notes (Signed)
This encounter was created in error - please disregard.

## 2013-06-22 ENCOUNTER — Other Ambulatory Visit: Payer: Self-pay

## 2013-06-22 LAB — CBC WITH DIFFERENTIAL/PLATELET
Basophil %: 0.8 %
Eosinophil #: 0.1 10*3/uL (ref 0.0–0.7)
Lymphocyte %: 13.4 %
MCH: 30.9 pg (ref 26.0–34.0)
Monocyte %: 9 %
Neutrophil %: 76.2 %
Platelet: 235 10*3/uL (ref 150–440)
RDW: 13.4 % (ref 11.5–14.5)
WBC: 9.5 10*3/uL (ref 3.6–11.0)

## 2013-06-22 LAB — COMPREHENSIVE METABOLIC PANEL
BUN: 14 mg/dL (ref 7–18)
Bilirubin,Total: 0.4 mg/dL (ref 0.2–1.0)
EGFR (Non-African Amer.): >60
SGOT(AST): 19 U/L (ref 15–37)
SGPT (ALT): 20 U/L (ref 12–78)
Total Protein: 7.2 g/dL (ref 6.4–8.2)

## 2013-07-01 NOTE — Progress Notes (Signed)
Patient ID: [name], female   DOB: 02/03/1934, 79 y.o.   MRN: [id number]  [facility] Allergies  Allergen Reactions  . Ace Inhibitors     unknown  . Baclofen     unknown  . Cefaclor     unknown  . Chlorzoxazone     unknown  . Cloxacillin     unknown  . Codeine     unknown  . Diclofenac Sodium     unknown  . Erythromycin     unknown  . Meperidine Hcl     unknown  . Nsaids     unknown  . Promethazine     unknown  . Propoxyphene-Acetaminophen     unknown  . Sulfonamide Derivatives     unknown    Chief Complaint  Patient presents with  . Acute Visit    confusion      HPI: Nursing staff reports that she is more confused than normal for her. She is unable to participate in the hpi or ros. Staff reports that there is concern that she may be developing an uti. Her appeitte and fluid intake has declined as well. There are no reports of fever present.   Past Medical History  Diagnosis Date  . DIABETES MELLITUS, TYPE I 05/30/2007  . HYPERCHOLESTEROLEMIA 10/23/2008  . HYPERTENSION 05/30/2007  . OSTEOARTHRITIS, LUMBAR SPINE 12/25/2007  . Depression 10/29/2011  . GERD (gastroesophageal reflux disease) 10/29/2011  . Diastolic CHF, chronic   . CAD (coronary artery disease)   . Anxiety problem   . Wedge compression fracture of T11-T12 vertebra   . Lumbar degenerative disc disease   . Anemia, unspecified   . Lumbar spinal stenosis   . Allergic rhinitis, cause unspecified 10/29/2011  . Osteoporosis 10/29/2011  . Failure to thrive in childhood 10/29/2011  . Osteoarthritis of right knee 10/29/2011  . History of colon polyps 11/01/2011  . Chronic pain 11/01/2011  . Cryptogenic stroke 01/2013    MRI demonstrated acute infarct in the right PCA territory as well as numerous other small acute infarcts in the left ACA, left PCA and bilateral MCA territories all consistent with embolic CVA. Carotid Dopplers were negative for ICA stenosis. Echocardiogram 02/02/13: EF 60-65%, normal wall  motion. TEE 4/23 (performed by Dr. Elease Hashimoto): EF 55-60%, mild AI, no LAA clot, normal valves    Past Surgical History  Procedure Laterality Date  . Abdominal hysterectomy    . Cholecystectomy    . Appendectomy    . Back surgury    . Tee without cardioversion N/A 02/06/2013    Procedure: TRANSESOPHAGEAL ECHOCARDIOGRAM (TEE);  Surgeon: Vesta Mixer, MD;  Location: [facility] ENDOSCOPY;  Service: Cardiovascular;  Laterality: N/A;    Current Outpatient Prescriptions on File Prior to Visit  Medication Sig Dispense Refill  . amLODipine (NORVASC) 5 MG tablet Take 5 mg by mouth daily.      Marland Kitchen aspirin 325 MG tablet Take 1 tablet (325 mg total) by mouth daily.      Marland Kitchen atorvastatin (LIPITOR) 10 MG tablet Take 1 tablet (10 mg total) by mouth daily.  90 tablet  3  . clonazePAM (KLONOPIN) 0.5 MG tablet Take 0.25 mg by mouth 2 (two) times daily as needed for anxiety.      . fexofenadine (ALLEGRA) 180 MG tablet Take 1 tablet (180 mg total) by mouth daily.  90 tablet  3  . fluticasone (FLONASE) 50 MCG/ACT nasal spray Place 1 spray into the nose at bedtime. In each nostril  16 g  5  . hydrALAZINE (APRESOLINE) 50 MG tablet Take 50 mg by mouth 2 (two) times daily.       . hydrochlorothiazide (HYDRODIURIL) 25 MG tablet Take 25 mg by mouth daily.      Marland Kitchen HYDROcodone-acetaminophen (NORCO/VICODIN) 5-325 MG per tablet Take 1 tablet by mouth every 6 (six) hours as needed for pain.  30 tablet  0  . insulin glargine (LANTUS) 100 UNIT/ML injection Inject 0.3 mLs (30 Units total) into the skin at bedtime.  10 mL    . insulin lispro (HUMALOG) 100 UNIT/ML injection Inject 3 Units into the skin 3 (three) times daily before meals. If blood sugar is above 250      . insulin lispro protamine-lispro (HUMALOG 75/25) (75-25) 100 UNIT/ML SUSP injection Inject 30-35 Units into the skin 2 (two) times daily with a meal. 30 units in the AM and 35 units in the PM      . isosorbide dinitrate (ISORDIL) 20 MG tablet Take 40 mg by mouth 2 (two)  times daily.      Marland Kitchen losartan (COZAAR) 100 MG tablet Take 100 mg by mouth daily.      . metoCLOPramide (REGLAN) 5 MG tablet Take 1 tablet (5 mg total) by mouth daily.  30 tablet  0  . metoprolol succinate (TOPROL-XL) 25 MG 24 hr tablet Take 50 mg by mouth daily.       Marland Kitchen omeprazole (PRILOSEC) 20 MG capsule Take 1 capsule (20 mg total) by mouth daily.  90 capsule  3  . potassium chloride (MICRO-K) 10 MEQ CR capsule Take 10 mEq by mouth 2 (two) times daily.      . sennosides-docusate sodium (SENOKOT-S) 8.6-50 MG tablet Take 1 tablet by mouth daily as needed for constipation.      . sertraline (ZOLOFT) 50 MG tablet Take 50 mg by mouth daily.       No current facility-administered medications on file prior to visit.    Filed Vitals:   07/01/13 1419  BP: 125/69  Pulse: 70  Height: 5\' 3"  (1.6 m)  Weight: 137 lb (62.143 kg)    SIGNIFICANT DIAGNOSTIC EXAMS  03-01-13: ct of head: Atrophy and chronic ischemia.  No acute abnormality.  03-01-13: ct of cervical spine:  Negative for fracture.  03-01-13: pelvic x-ray: No acute finding.  Stable compared to prior exam.    LABS REVIEWED:   01-29-13: tsh 0.91 02-01-13: wbc 9.0; hgb 16.5; hct 46.7; mcv 85.5 ;plt 184 02-08-13: glucose 254; bun 11; creat 0.46; k+3.5; na++137 02-13-13: wbc 9.7; hgb 14.7; hct 43.8; mcv 88.8; plt 274; glucose 241; bun 32; creat 0.94; k+4.7 Na++137; chol 148; ldl 98; trig 131; hgb a1c 8.2  02-19-13: glucose 154; bun 18; creat 0.64; k+4.4; na++131 hgb a1c 9.1 03-09-13: urine culture: p.mirabilis: gentamycin 04-23-13; Hgb a1c 7.4    Review of Systems  Unable to perform ROS   Physical Exam  Constitutional: She appears well-developed and well-nourished.  thin  Neck: Neck supple. No JVD present. No thyromegaly present.  Cardiovascular: Normal rate, regular rhythm and intact distal pulses.   Respiratory: Effort normal and breath sounds normal. No respiratory distress. She has no wheezes.  GI: Soft. Bowel sounds are normal. She  exhibits no distension. There is no tenderness.  Musculoskeletal: She exhibits no edema.  Neurological: She is alert.  Skin: Skin is warm.    ASSESSMENT/PLAN  Due to her increased confusion; will obtain a ua/c&s and will continue to monitor her status; will have nursing continue to  encourage her fluid intake.

## 2013-07-17 ENCOUNTER — Encounter: Payer: Self-pay | Admitting: Internal Medicine

## 2013-10-04 ENCOUNTER — Non-Acute Institutional Stay (SKILLED_NURSING_FACILITY): Payer: Medicare Other | Admitting: Adult Health

## 2013-10-04 DIAGNOSIS — K219 Gastro-esophageal reflux disease without esophagitis: Secondary | ICD-10-CM

## 2013-10-04 DIAGNOSIS — J309 Allergic rhinitis, unspecified: Secondary | ICD-10-CM

## 2013-10-04 DIAGNOSIS — E876 Hypokalemia: Secondary | ICD-10-CM

## 2013-10-04 DIAGNOSIS — I10 Essential (primary) hypertension: Secondary | ICD-10-CM

## 2013-10-04 DIAGNOSIS — F329 Major depressive disorder, single episode, unspecified: Secondary | ICD-10-CM

## 2013-10-04 DIAGNOSIS — I2510 Atherosclerotic heart disease of native coronary artery without angina pectoris: Secondary | ICD-10-CM

## 2013-10-04 DIAGNOSIS — E7800 Pure hypercholesterolemia, unspecified: Secondary | ICD-10-CM

## 2013-10-07 ENCOUNTER — Encounter: Payer: Self-pay | Admitting: Adult Health

## 2013-10-07 NOTE — Progress Notes (Signed)
Patient ID: [name], female   DOB: 12-10-1933, 79 y.o.   MRN: [id number]     Allergies  Allergen Reactions  . Ace Inhibitors     unknown  . Baclofen     unknown  . Cefaclor     unknown  . Chlorzoxazone     unknown  . Cloxacillin     unknown  . Codeine     unknown  . Diclofenac Sodium     unknown  . Erythromycin     unknown  . Meperidine Hcl     unknown  . Nsaids     unknown  . Promethazine     unknown  . Propoxyphene-Acetaminophen     unknown  . Sulfonamide Derivatives     unknown     Chief Complaint  Patient presents with  . Medical Managment of Chronic Issues    HPI:  She is being seen for the management of her chronic illnesses. There has been no significant change in her recent status. There are no concerns being voiced by the nursing staff at this time; she is unable to fully participate in the hpi or ros. Her blood pressure readings have been low. Also her hgb a1c is slightly low at 6.0. Will need to adjust her medications.    Past Medical History  Diagnosis Date  . DIABETES MELLITUS, TYPE I 05/30/2007  . HYPERCHOLESTEROLEMIA 10/23/2008  . HYPERTENSION 05/30/2007  . OSTEOARTHRITIS, LUMBAR SPINE 12/25/2007  . Depression 10/29/2011  . GERD (gastroesophageal reflux disease) 10/29/2011  . Diastolic CHF, chronic   . CAD (coronary artery disease)   . Anxiety problem   . Wedge compression fracture of T11-T12 vertebra   . Lumbar degenerative disc disease   . Anemia, unspecified   . Lumbar spinal stenosis   . Allergic rhinitis, cause unspecified 10/29/2011  . Osteoporosis 10/29/2011  . Failure to thrive in childhood 10/29/2011  . Osteoarthritis of right knee 10/29/2011  . History of colon polyps 11/01/2011  . Chronic pain 11/01/2011  . Cryptogenic stroke 01/2013    MRI demonstrated acute infarct in the right PCA territory as well as numerous other small acute infarcts in the left ACA, left PCA and bilateral MCA territories all consistent with embolic CVA.  Carotid Dopplers were negative for ICA stenosis. Echocardiogram 02/02/13: EF 60-65%, normal wall motion. TEE 4/23 (performed by Dr. Elease Hashimoto): EF 55-60%, mild AI, no LAA clot, normal valves    Past Surgical History  Procedure Laterality Date  . Abdominal hysterectomy    . Cholecystectomy    . Appendectomy    . Back surgury    . Tee without cardioversion N/A 02/06/2013    Procedure: TRANSESOPHAGEAL ECHOCARDIOGRAM (TEE);  Surgeon: Vesta Mixer, MD;  Location: [facility] ENDOSCOPY;  Service: Cardiovascular;  Laterality: N/A;    VITAL SIGNS BP 108/58  Pulse 80  Ht 5\' 3"  (1.6 m)  Wt 139 lb 12.8 oz (63.413 kg)  BMI 24.77 kg/m2   Patient's Medications  New Prescriptions   No medications on file  Previous Medications   AMLODIPINE (NORVASC) 5 MG TABLET    Take 5 mg by mouth daily.   ASPIRIN 325 MG TABLET    Take 1 tablet (325 mg total) by mouth daily.   ATORVASTATIN (LIPITOR) 10 MG TABLET    Take 1 tablet (10 mg total) by mouth daily.   CLONAZEPAM (KLONOPIN) 0.5 MG TABLET    Take 0.25 mg by mouth 2 (two) times daily as needed for anxiety.   FEXOFENADINE (  ALLEGRA) 180 MG TABLET    Take 1 tablet (180 mg total) by mouth daily.   FLUTICASONE (FLONASE) 50 MCG/ACT NASAL SPRAY    Place 1 spray into the nose at bedtime. In each nostril   HYDRALAZINE (APRESOLINE) 50 MG TABLET    Take 50 mg by mouth 2 (two) times daily.    HYDROCHLOROTHIAZIDE (HYDRODIURIL) 25 MG TABLET    Take 25 mg by mouth daily.   HYDROCODONE-ACETAMINOPHEN (NORCO/VICODIN) 5-325 MG PER TABLET    Take 1 tablet by mouth every 6 (six) hours as needed for pain.   INSULIN GLARGINE (LANTUS) 100 UNIT/ML INJECTION    Inject 0.3 mLs (30 Units total) into the skin at bedtime.   INSULIN LISPRO (HUMALOG) 100 UNIT/ML INJECTION    Inject 3 Units into the skin 3 (three) times daily before meals. If blood sugar is above 250   INSULIN LISPRO PROTAMINE-LISPRO (HUMALOG 75/25) (75-25) 100 UNIT/ML SUSP INJECTION    Inject 32 Units into the skin 2 (two) times  daily with a meal.    ISOSORBIDE DINITRATE (ISORDIL) 20 MG TABLET    Take 40 mg by mouth 2 (two) times daily.   LOSARTAN (COZAAR) 100 MG TABLET    Take 100 mg by mouth daily.   METOCLOPRAMIDE (REGLAN) 5 MG TABLET    Take 1 tablet (5 mg total) by mouth daily.   METOPROLOL SUCCINATE (TOPROL-XL) 25 MG 24 HR TABLET    Take 50 mg by mouth daily.    OMEPRAZOLE (PRILOSEC) 20 MG CAPSULE    Take 1 capsule (20 mg total) by mouth daily.   POTASSIUM CHLORIDE (MICRO-K) 10 MEQ CR CAPSULE    Take 10 mEq by mouth 2 (two) times daily.   SENNOSIDES-DOCUSATE SODIUM (SENOKOT-S) 8.6-50 MG TABLET    Take 1 tablet by mouth daily as needed for constipation.   SERTRALINE (ZOLOFT) 50 MG TABLET    Take 50 mg by mouth daily.  Modified Medications   No medications on file  Discontinued Medications   No medications on file    SIGNIFICANT DIAGNOSTIC EXAMS   03-01-13: ct of head: Atrophy and chronic ischemia.  No acute abnormality.  03-01-13: ct of cervical spine:  Negative for fracture.  03-01-13: pelvic x-ray: No acute finding.  Stable compared to prior exam.    LABS REVIEWED:   01-29-13: tsh 0.91 02-01-13: wbc 9.0; hgb 16.5; hct 46.7; mcv 85.5 ;plt 184 02-08-13: glucose 254; bun 11; creat 0.46; k+3.5; na++137 02-13-13: wbc 9.7; hgb 14.7; hct 43.8; mcv 88.8; plt 274; glucose 241; bun 32; creat 0.94; k+4.7 Na++137; chol 148; ldl 98; trig 131; hgb a1c 8.2  02-19-13: glucose 154; bun 18; creat 0.64; k+4.4; na++131 hgb a1c 9.1 03-09-13: urine culture: p.mirabilis: gentamycin 04-23-13; Hgb a1c 7.4  06-22-13: wbc 9.5; hgb 14.4; hct 42.7; mcv 92 plt 235; glucose 147; bun 14; creat 0.85; k+3.6; na++136; liver normal albumin 3.6  07-19-13: wbc 6.8; hgb 14.2 ;hct 44.5 mcv 93.9; plt 236; glucose 73; bun 15; creat 0.7; k+4.0; na++137; chol 142; ldl 92; trig 77 08-02-13: hgb a1c 6.0    Review of Systems  Unable to perform ROS   Physical Exam  Constitutional: She appears well-developed and well-nourished.  thin  Neck: Neck  supple. No JVD present. No thyromegaly present.  Cardiovascular: Normal rate, regular rhythm and intact distal pulses.   Respiratory: Effort normal and breath sounds normal. No respiratory distress. She has no wheezes.  GI: Soft. Bowel sounds are normal. She exhibits no distension. There is  no tenderness.  Musculoskeletal: She exhibits no edema.  Neurological: She is alert.  Skin: Skin is warm.       ASSESSMENT/ PLAN:  GERD (gastroesophageal reflux disease) Will continue prilosec 20 mg daily and reglan 5 mg daily will monitor   HYPERTENSION Her blood pressures have been low will stop the hctz; will continue the cozaar 100 mg daily; norvasc 5 mg daily; toprol xl 50 mg daily and apresoline 50 mg twice daily and will monitor her status    HYPERCHOLESTEROLEMIA Will continue lipitor 10 mg daily and will monitor   Depression Is stable will continue zoloft 50 mg daily and klonopin 0.25 mg twice daily as needed for anxiety and will monitor   Hypokalemia Will continue k+ 10 meq twice daily   CAD (coronary artery disease) Is presently stable will continue her isordil 40 mg twice daily; asa 325 mg daily and will monitor  Allergic rhinitis, cause unspecified Will continue her allegra 180 mg daily and flonase nightly   Diabetes mellitus type 2 with complications, uncontrolled Will continue her lantus 30 units nightly; humalog mix 75/25: 32 units twice daily withher hgb a1c of 6.0; will stop the   humalog 3 units prior to meals for cbg >250; will have nursing check cbg ac and hs for one week and report; may able to further reduce her insulin based upon her needs.     Time spent with patient 40 minutes.

## 2013-11-05 ENCOUNTER — Non-Acute Institutional Stay (SKILLED_NURSING_FACILITY): Payer: Medicare Other | Admitting: Adult Health

## 2013-11-05 DIAGNOSIS — K219 Gastro-esophageal reflux disease without esophagitis: Secondary | ICD-10-CM

## 2013-11-05 DIAGNOSIS — F329 Major depressive disorder, single episode, unspecified: Secondary | ICD-10-CM

## 2013-11-05 DIAGNOSIS — IMO0002 Reserved for concepts with insufficient information to code with codable children: Secondary | ICD-10-CM

## 2013-11-05 DIAGNOSIS — F3289 Other specified depressive episodes: Secondary | ICD-10-CM

## 2013-11-05 DIAGNOSIS — F32A Depression, unspecified: Secondary | ICD-10-CM

## 2013-11-05 DIAGNOSIS — E118 Type 2 diabetes mellitus with unspecified complications: Secondary | ICD-10-CM

## 2013-11-05 DIAGNOSIS — I10 Essential (primary) hypertension: Secondary | ICD-10-CM

## 2013-11-05 DIAGNOSIS — E7800 Pure hypercholesterolemia, unspecified: Secondary | ICD-10-CM

## 2013-11-05 DIAGNOSIS — J309 Allergic rhinitis, unspecified: Secondary | ICD-10-CM

## 2013-11-05 DIAGNOSIS — E1165 Type 2 diabetes mellitus with hyperglycemia: Secondary | ICD-10-CM

## 2013-11-05 DIAGNOSIS — I2510 Atherosclerotic heart disease of native coronary artery without angina pectoris: Secondary | ICD-10-CM

## 2013-11-05 DIAGNOSIS — E876 Hypokalemia: Secondary | ICD-10-CM

## 2013-11-07 ENCOUNTER — Encounter: Payer: Self-pay | Admitting: Adult Health

## 2013-11-07 NOTE — Progress Notes (Signed)
Patient ID: Scot Jun, female   DOB: Jun 10, 1934, 80 y.o.   MRN: [id number]     [facility]  Allergies  Allergen Reactions  . Ace Inhibitors     unknown  . Baclofen     unknown  . Cefaclor     unknown  . Chlorzoxazone     unknown  . Cloxacillin     unknown  . Codeine     unknown  . Diclofenac Sodium     unknown  . Erythromycin     unknown  . Meperidine Hcl     unknown  . Nsaids     unknown  . Promethazine     unknown  . Propoxyphene N-Acetaminophen     unknown  . Sulfonamide Derivatives     unknown     Chief Complaint  Patient presents with  . Medical Managment of Chronic Issues    HPI:  She is being seen for the management of her chronic illnesses. There are no concerns being voiced by the nursing staff at this time. She is unable to fully participate in the hpi or ros. Overall there has been little change in her status in the recent past.    Past Medical History  Diagnosis Date  . DIABETES MELLITUS, TYPE I 05/30/2007  . HYPERCHOLESTEROLEMIA 10/23/2008  . HYPERTENSION 05/30/2007  . OSTEOARTHRITIS, LUMBAR SPINE 12/25/2007  . Depression 10/29/2011  . GERD (gastroesophageal reflux disease) 10/29/2011  . Diastolic CHF, chronic   . CAD (coronary artery disease)   . Anxiety problem   . Wedge compression fracture of T11-T12 vertebra   . Lumbar degenerative disc disease   . Anemia, unspecified   . Lumbar spinal stenosis   . Allergic rhinitis, cause unspecified 10/29/2011  . Osteoporosis 10/29/2011  . Failure to thrive in childhood 10/29/2011  . Osteoarthritis of right knee 10/29/2011  . History of colon polyps 11/01/2011  . Chronic pain 11/01/2011  . Cryptogenic stroke 01/2013    MRI demonstrated acute infarct in the right PCA territory as well as numerous other small acute infarcts in the left ACA, left PCA and bilateral MCA territories all consistent with embolic CVA. Carotid Dopplers were negative for ICA stenosis. Echocardiogram 02/02/13: EF 60-65%, normal  wall motion. TEE 4/23 (performed by Dr. Elease Hashimoto): EF 55-60%, mild AI, no LAA clot, normal valves    Past Surgical History  Procedure Laterality Date  . Abdominal hysterectomy    . Cholecystectomy    . Appendectomy    . Back surgury    . Tee without cardioversion N/A 02/06/2013    Procedure: TRANSESOPHAGEAL ECHOCARDIOGRAM (TEE);  Surgeon: Vesta Mixer, MD;  Location: [facility] ENDOSCOPY;  Service: Cardiovascular;  Laterality: N/A;    VITAL SIGNS BP 118/70  Pulse 70  Ht 5\' 3"  (1.6 m)  Wt 139 lb (63.05 kg)  BMI 24.63 kg/m2   Patient's Medications  New Prescriptions   No medications on file  Previous Medications   AMLODIPINE (NORVASC) 5 MG TABLET    Take 5 mg by mouth daily.   ASPIRIN 325 MG TABLET    Take 1 tablet (325 mg total) by mouth daily.   ATORVASTATIN (LIPITOR) 10 MG TABLET    Take 1 tablet (10 mg total) by mouth daily.   CLONAZEPAM (KLONOPIN) 0.5 MG TABLET    Take 0.25 mg by mouth 2 (two) times daily as needed for anxiety.   FEXOFENADINE (ALLEGRA) 180 MG TABLET    Take 1 tablet (180 mg total) by mouth daily.  FLUTICASONE (FLONASE) 50 MCG/ACT NASAL SPRAY    Place 1 spray into the nose at bedtime. In each nostril   HYDRALAZINE (APRESOLINE) 50 MG TABLET    Take 50 mg by mouth 2 (two) times daily.    HYDROCODONE-ACETAMINOPHEN (NORCO/VICODIN) 5-325 MG PER TABLET    Take 1 tablet by mouth every 6 (six) hours as needed for pain.   INSULIN GLARGINE (LANTUS) 100 UNIT/ML INJECTION    Inject 0.3 mLs (30 Units total) into the skin at bedtime.   INSULIN LISPRO PROTAMINE-LISPRO (HUMALOG 75/25) (75-25) 100 UNIT/ML SUSP INJECTION    Inject 32 Units into the skin 2 (two) times daily with a meal.    ISOSORBIDE DINITRATE (ISORDIL) 20 MG TABLET    Take 40 mg by mouth 2 (two) times daily.   LOSARTAN (COZAAR) 100 MG TABLET    Take 100 mg by mouth daily.   METOCLOPRAMIDE (REGLAN) 5 MG TABLET    Take 1 tablet (5 mg total) by mouth daily.   METOPROLOL SUCCINATE (TOPROL-XL) 25 MG 24 HR TABLET    Take 50  mg by mouth daily.    OMEPRAZOLE (PRILOSEC) 20 MG CAPSULE    Take 1 capsule (20 mg total) by mouth daily.   POTASSIUM CHLORIDE (MICRO-K) 10 MEQ CR CAPSULE    Take 10 mEq by mouth 2 (two) times daily.   SENNOSIDES-DOCUSATE SODIUM (SENOKOT-S) 8.6-50 MG TABLET    Take 1 tablet by mouth daily as needed for constipation.   SERTRALINE (ZOLOFT) 50 MG TABLET    Take 50 mg by mouth daily.  Modified Medications   No medications on file  Discontinued Medications   No medications on file    SIGNIFICANT DIAGNOSTIC EXAMS   03-01-13: ct of head: Atrophy and chronic ischemia.  No acute abnormality.  03-01-13: ct of cervical spine:  Negative for fracture.  03-01-13: pelvic x-ray: No acute finding.  Stable compared to prior exam.    LABS REVIEWED:   01-29-13: tsh 0.91 02-01-13: wbc 9.0; hgb 16.5; hct 46.7; mcv 85.5 ;plt 184 02-08-13: glucose 254; bun 11; creat 0.46; k+3.5; na++137 02-13-13: wbc 9.7; hgb 14.7; hct 43.8; mcv 88.8; plt 274; glucose 241; bun 32; creat 0.94; k+4.7 Na++137; chol 148; ldl 98; trig 131; hgb a1c 8.2  02-19-13: glucose 154; bun 18; creat 0.64; k+4.4; na++131 hgb a1c 9.1 03-09-13: urine culture: p.mirabilis: gentamycin 04-23-13; Hgb a1c 7.4  06-22-13: wbc 9.5; hgb 14.4; hct 42.7; mcv 92 plt 235; glucose 147; bun 14; creat 0.85; k+3.6; na++136; liver normal albumin 3.6  07-19-13: wbc 6.8; hgb 14.2 ;hct 44.5 mcv 93.9; plt 236; glucose 73; bun 15; creat 0.7; k+4.0; na++137; chol 142; ldl 92; trig 77 08-02-13: hgb a1c 6.0  10-07-13: hgb a1c 6.3    Review of Systems  Unable to perform ROS   Physical Exam  Constitutional: She appears well-developed and well-nourished.  thin  Neck: Neck supple. No JVD present. No thyromegaly present.  Cardiovascular: Normal rate, regular rhythm and intact distal pulses.   Respiratory: Effort normal and breath sounds normal. No respiratory distress. She has no wheezes.  GI: Soft. Bowel sounds are normal. She exhibits no distension. There is no  tenderness.  Musculoskeletal: She exhibits no edema.  Neurological: She is alert.  Skin: Skin is warm.       ASSESSMENT/ PLAN:  GERD (gastroesophageal reflux disease) Will continue prilosec 20 mg daily and reglan 5 mg daily will monitor   HYPERTENSION Her blood pressures stable; will continue the cozaar 100 mg daily;  norvasc 5 mg daily; toprol xl 50 mg daily and apresoline 50 mg twice daily and will monitor her status    HYPERCHOLESTEROLEMIA Will continue lipitor 10 mg daily and will monitor   Depression Is stable will continue zoloft 50 mg daily and klonopin 0.25 mg twice daily as needed for anxiety and will monitor   Hypokalemia Will continue k+ 10 meq twice daily   CAD (coronary artery disease) Is presently stable will continue her isordil 40 mg twice daily; asa 325 mg daily and will monitor  Allergic rhinitis, cause unspecified Will continue her allegra 180 mg daily and flonase nightly   Diabetes mellitus type 2 with complications, uncontrolled Will continue her lantus 30 units nightly; humalog mix 75/25: 32 units twice daily will continue to monitor her status.

## 2013-12-02 ENCOUNTER — Encounter: Payer: Self-pay | Admitting: Adult Health

## 2013-12-02 ENCOUNTER — Non-Acute Institutional Stay (SKILLED_NURSING_FACILITY): Payer: Medicare Other | Admitting: Adult Health

## 2013-12-02 DIAGNOSIS — E1159 Type 2 diabetes mellitus with other circulatory complications: Secondary | ICD-10-CM

## 2013-12-02 DIAGNOSIS — F32A Depression, unspecified: Secondary | ICD-10-CM

## 2013-12-02 DIAGNOSIS — F3289 Other specified depressive episodes: Secondary | ICD-10-CM

## 2013-12-02 DIAGNOSIS — I2510 Atherosclerotic heart disease of native coronary artery without angina pectoris: Secondary | ICD-10-CM

## 2013-12-02 DIAGNOSIS — I10 Essential (primary) hypertension: Secondary | ICD-10-CM

## 2013-12-02 DIAGNOSIS — F29 Unspecified psychosis not due to a substance or known physiological condition: Secondary | ICD-10-CM

## 2013-12-02 DIAGNOSIS — F329 Major depressive disorder, single episode, unspecified: Secondary | ICD-10-CM

## 2013-12-02 DIAGNOSIS — E876 Hypokalemia: Secondary | ICD-10-CM

## 2013-12-02 DIAGNOSIS — J309 Allergic rhinitis, unspecified: Secondary | ICD-10-CM

## 2013-12-02 DIAGNOSIS — E7800 Pure hypercholesterolemia, unspecified: Secondary | ICD-10-CM

## 2013-12-02 NOTE — Progress Notes (Signed)
Patient ID: [name], female   DOB: February 06, 1934, 80 y.o.   MRN: [id number]     [facility]   Allergies  Allergen Reactions  . Ace Inhibitors     unknown  . Baclofen     unknown  . Cefaclor     unknown  . Chlorzoxazone     unknown  . Cloxacillin     unknown  . Codeine     unknown  . Diclofenac Sodium     unknown  . Erythromycin     unknown  . Meperidine Hcl     unknown  . Nsaids     unknown  . Promethazine     unknown  . Propoxyphene N-Acetaminophen     unknown  . Sulfonamide Derivatives     unknown      Chief Complaint  Patient presents with  . Medical Managment of Chronic Issues    HPI:  She is being seen for the management of her chronic illnesses. The nursing staff reports that she is becoming more verbally and phasically aggressive with staff members and others. She has been treated for un uti this past month without further complications. She is experiencing periods of agitation as well. She is unable to fully participate in the hpi or ros.    Past Medical History  Diagnosis Date  . DIABETES MELLITUS, TYPE I 05/30/2007  . HYPERCHOLESTEROLEMIA 10/23/2008  . HYPERTENSION 05/30/2007  . OSTEOARTHRITIS, LUMBAR SPINE 12/25/2007  . Depression 10/29/2011  . GERD (gastroesophageal reflux disease) 10/29/2011  . Diastolic CHF, chronic   . CAD (coronary artery disease)   . Anxiety problem   . Wedge compression fracture of T11-T12 vertebra   . Lumbar degenerative disc disease   . Anemia, unspecified   . Lumbar spinal stenosis   . Allergic rhinitis, cause unspecified 10/29/2011  . Osteoporosis 10/29/2011  . Failure to thrive in childhood 10/29/2011  . Osteoarthritis of right knee 10/29/2011  . History of colon polyps 11/01/2011  . Chronic pain 11/01/2011  . Cryptogenic stroke 01/2013    MRI demonstrated acute infarct in the right PCA territory as well as numerous other small acute infarcts in the left ACA, left PCA and bilateral MCA territories all consistent  with embolic CVA. Carotid Dopplers were negative for ICA stenosis. Echocardiogram 02/02/13: EF 60-65%, normal wall motion. TEE 4/23 (performed by Dr. Elease Hashimoto): EF 55-60%, mild AI, no LAA clot, normal valves    Past Surgical History  Procedure Laterality Date  . Abdominal hysterectomy    . Cholecystectomy    . Appendectomy    . Back surgury    . Tee without cardioversion N/A 02/06/2013    Procedure: TRANSESOPHAGEAL ECHOCARDIOGRAM (TEE);  Surgeon: Vesta Mixer, MD;  Location: [facility] ENDOSCOPY;  Service: Cardiovascular;  Laterality: N/A;    VITAL SIGNS BP 138/78  Pulse 69  Ht 5\' 3"  (1.6 m)  Wt 141 lb 3.2 oz (64.048 kg)  BMI 25.02 kg/m2   Patient's Medications  New Prescriptions   No medications on file  Previous Medications   AMLODIPINE (NORVASC) 5 MG TABLET    Take 5 mg by mouth daily.   ASPIRIN 325 MG TABLET    Take 1 tablet (325 mg total) by mouth daily.   ATORVASTATIN (LIPITOR) 10 MG TABLET    Take 1 tablet (10 mg total) by mouth daily.   CLONAZEPAM (KLONOPIN) 0.5 MG TABLET    Take 0.25 mg by mouth 2 (two) times daily as needed for anxiety.   FEXOFENADINE (  ALLEGRA) 180 MG TABLET    Take 1 tablet (180 mg total) by mouth daily.   FLUTICASONE (FLONASE) 50 MCG/ACT NASAL SPRAY    Place 1 spray into the nose at bedtime. In each nostril   HYDRALAZINE (APRESOLINE) 50 MG TABLET    Take 50 mg by mouth 2 (two) times daily.    HYDROCODONE-ACETAMINOPHEN (NORCO/VICODIN) 5-325 MG PER TABLET    Take 1 tablet by mouth every 6 (six) hours as needed for pain.   INSULIN GLARGINE (LANTUS) 100 UNIT/ML INJECTION    Inject 0.3 mLs (30 Units total) into the skin at bedtime.   INSULIN LISPRO PROTAMINE-LISPRO (HUMALOG 75/25) (75-25) 100 UNIT/ML SUSP INJECTION    Inject 32 Units into the skin 2 (two) times daily with a meal.    ISOSORBIDE DINITRATE (ISORDIL) 20 MG TABLET    Take 40 mg by mouth 2 (two) times daily.   LOSARTAN (COZAAR) 100 MG TABLET    Take 100 mg by mouth daily.   METOCLOPRAMIDE (REGLAN) 5 MG  TABLET    Take 1 tablet (5 mg total) by mouth daily.   METOPROLOL SUCCINATE (TOPROL-XL) 25 MG 24 HR TABLET    Take 50 mg by mouth daily.    OMEPRAZOLE (PRILOSEC) 20 MG CAPSULE    Take 1 capsule (20 mg total) by mouth daily.   POTASSIUM CHLORIDE (MICRO-K) 10 MEQ CR CAPSULE    Take 10 mEq by mouth 2 (two) times daily.   SENNOSIDES-DOCUSATE SODIUM (SENOKOT-S) 8.6-50 MG TABLET    Take 1 tablet by mouth daily as needed for constipation.   SERTRALINE (ZOLOFT) 50 MG TABLET    Take 50 mg by mouth daily.  Modified Medications   No medications on file  Discontinued Medications   No medications on file    SIGNIFICANT DIAGNOSTIC EXAMS   03-01-13: ct of head: Atrophy and chronic ischemia.  No acute abnormality.  03-01-13: ct of cervical spine:  Negative for fracture.  03-01-13: pelvic x-ray: No acute finding.  Stable compared to prior exam.    LABS REVIEWED:   01-29-13: tsh 0.91 02-01-13: wbc 9.0; hgb 16.5; hct 46.7; mcv 85.5 ;plt 184 02-08-13: glucose 254; bun 11; creat 0.46; k+3.5; na++137 02-13-13: wbc 9.7; hgb 14.7; hct 43.8; mcv 88.8; plt 274; glucose 241; bun 32; creat 0.94; k+4.7 Na++137; chol 148; ldl 98; trig 131; hgb a1c 8.2  02-19-13: glucose 154; bun 18; creat 0.64; k+4.4; na++131 hgb a1c 9.1 03-09-13: urine culture: p.mirabilis: gentamycin 04-23-13; Hgb a1c 7.4  06-22-13: wbc 9.5; hgb 14.4; hct 42.7; mcv 92 plt 235; glucose 147; bun 14; creat 0.85; k+3.6; na++136; liver normal albumin 3.6  07-19-13: wbc 6.8; hgb 14.2 ;hct 44.5 mcv 93.9; plt 236; glucose 73; bun 15; creat 0.7; k+4.0; na++137; chol 142; ldl 92; trig 77 08-02-13: hgb a1c 6.0  10-07-13: hgb a1c 6.3  11-15-13: urine culture: e-coli: macrobid    Review of Systems  Unable to perform ROS   Physical Exam  Constitutional: She appears well-developed and well-nourished.  thin  Neck: Neck supple. No JVD present. No thyromegaly present.  Cardiovascular: Normal rate, regular rhythm and intact distal pulses.   Respiratory: Effort  normal and breath sounds normal. No respiratory distress. She has no wheezes.  GI: Soft. Bowel sounds are normal. She exhibits no distension. There is no tenderness.  Musculoskeletal: She exhibits no edema.  Neurological: She is alert.  Skin: Skin is warm.       ASSESSMENT/ PLAN:  GERD (gastroesophageal reflux disease) Will continue prilosec  20 mg daily and reglan 5 mg daily will monitor   HYPERTENSION Her blood pressures stable; will continue the cozaar 100 mg daily; norvasc 5 mg daily; toprol xl 50 mg daily and apresoline 50 mg twice daily and will monitor her status    HYPERCHOLESTEROLEMIA Will continue lipitor 10 mg daily and will monitor   Depression Is stable will continue zoloft 50 mg daily and klonopin 0.25 mg twice daily as needed for anxiety and will monitor   Hypokalemia Will stop her k+ and will check bmp in 2 weeks.    CAD (coronary artery disease) Is presently stable will continue her isordil 40 mg twice daily; asa 325 mg daily and will monitor  Allergic rhinitis, cause unspecified Will continue her allegra 180 mg daily and flonase nightly   Diabetes mellitus type 2 with complications, uncontrolled Will continue her lantus 30 units nightly; humalog mix 75/25: 32 units twice daily will continue to monitor her status.   Psychosis  Will begin risperdal liquid 0.25 mg nightly for one week then 0.5 mg nightly due to her aggressive behaviors and will monitor her status.       Synthia Innocent NP [facility]  Contact [phone] Monday through Friday 8am- 5pm  After hours call [phone]

## 2013-12-03 DIAGNOSIS — E1159 Type 2 diabetes mellitus with other circulatory complications: Secondary | ICD-10-CM | POA: Insufficient documentation

## 2013-12-03 DIAGNOSIS — F29 Unspecified psychosis not due to a substance or known physiological condition: Secondary | ICD-10-CM | POA: Insufficient documentation

## 2013-12-03 MED ORDER — RISPERIDONE 1 MG/ML PO SOLN: 0.2500 mg | Freq: Every day | ORAL | Status: DC

## 2013-12-09 ENCOUNTER — Other Ambulatory Visit: Payer: Self-pay | Admitting: *Deleted

## 2013-12-09 ENCOUNTER — Non-Acute Institutional Stay (SKILLED_NURSING_FACILITY): Payer: Medicare Other | Admitting: Adult Health

## 2013-12-09 DIAGNOSIS — M79609 Pain in unspecified limb: Secondary | ICD-10-CM

## 2013-12-09 DIAGNOSIS — M79601 Pain in right arm: Secondary | ICD-10-CM

## 2013-12-09 MED ORDER — HYDROCODONE-ACETAMINOPHEN 5-325 MG PO TABS: ORAL_TABLET | ORAL | Status: DC

## 2013-12-09 NOTE — Telephone Encounter (Signed)
[facility] 

## 2013-12-10 ENCOUNTER — Encounter: Payer: Self-pay | Admitting: Adult Health

## 2013-12-10 DIAGNOSIS — M79601 Pain in right arm: Secondary | ICD-10-CM | POA: Insufficient documentation

## 2013-12-10 NOTE — Progress Notes (Signed)
Patient ID: [name], female   DOB: [birth year], 80 y.o.   MRN: [id number]     [facility]  Allergies  Allergen Reactions  . Ace Inhibitors     unknown  . Baclofen     unknown  . Cefaclor     unknown  . Chlorzoxazone     unknown  . Cloxacillin     unknown  . Codeine     unknown  . Diclofenac Sodium     unknown  . Erythromycin     unknown  . Meperidine Hcl     unknown  . Nsaids     unknown  . Promethazine     unknown  . Propoxyphene N-Acetaminophen     unknown  . Sulfonamide Derivatives     unknown     Chief Complaint  Patient presents with  . Acute Visit    right arm swelling    HPI:  Nursing reports that she has right arm swelling. There are no reports of falls present and no reports of other injuries present as well. She does not remember hurting herself. She is able to use her right arm to propel herself in her wheelchair. The swelling on her forearm is worse proximal than distal with a distinct dividing line in the swelling present. This could indicate that she hit her arm on the bed rail or that during nursing care her arm held in order help prevent injury to the staff. She has been physically aggressive toward staff members striking out at them randomly and during care.    Past Medical History  Diagnosis Date  . DIABETES MELLITUS, TYPE I 05/30/2007  . HYPERCHOLESTEROLEMIA 10/23/2008  . HYPERTENSION 05/30/2007  . OSTEOARTHRITIS, LUMBAR SPINE 12/25/2007  . Depression 10/29/2011  . GERD (gastroesophageal reflux disease) 10/29/2011  . Diastolic CHF, chronic   . CAD (coronary artery disease)   . Anxiety problem   . Wedge compression fracture of T11-T12 vertebra   . Lumbar degenerative disc disease   . Anemia, unspecified   . Lumbar spinal stenosis   . Allergic rhinitis, cause unspecified 10/29/2011  . Osteoporosis 10/29/2011  . Failure to thrive in childhood 10/29/2011  . Osteoarthritis of right knee 10/29/2011  . History of colon polyps 11/01/2011  .  Chronic pain 11/01/2011  . Cryptogenic stroke 01/2013    MRI demonstrated acute infarct in the right PCA territory as well as numerous other small acute infarcts in the left ACA, left PCA and bilateral MCA territories all consistent with embolic CVA. Carotid Dopplers were negative for ICA stenosis. Echocardiogram 02/02/13: EF 60-65%, normal wall motion. TEE 4/23 (performed by Dr. Elease Hashimoto): EF 55-60%, mild AI, no LAA clot, normal valves    Past Surgical History  Procedure Laterality Date  . Abdominal hysterectomy    . Cholecystectomy    . Appendectomy    . Back surgury    . Tee without cardioversion N/A 02/06/2013    Procedure: TRANSESOPHAGEAL ECHOCARDIOGRAM (TEE);  Surgeon: Vesta Mixer, MD;  Location: [facility] ENDOSCOPY;  Service: Cardiovascular;  Laterality: N/A;    VITAL SIGNS BP 124/64  Pulse 68  Ht 5\' 3"  (1.6 m)  Wt 141 lb 3.2 oz (64.048 kg)  BMI 25.02 kg/m2   Patient's Medications  New Prescriptions   No medications on file  Previous Medications   AMLODIPINE (NORVASC) 5 MG TABLET    Take 5 mg by mouth daily.   ASPIRIN 325 MG TABLET    Take 1 tablet (325 mg total) by  mouth daily.   ATORVASTATIN (LIPITOR) 10 MG TABLET    Take 1 tablet (10 mg total) by mouth daily.   CLONAZEPAM (KLONOPIN) 0.5 MG TABLET    Take 0.25 mg by mouth 2 (two) times daily as needed for anxiety.   FEXOFENADINE (ALLEGRA) 180 MG TABLET    Take 1 tablet (180 mg total) by mouth daily.   FLUTICASONE (FLONASE) 50 MCG/ACT NASAL SPRAY    Place 1 spray into the nose at bedtime. In each nostril   HYDRALAZINE (APRESOLINE) 50 MG TABLET    Take 50 mg by mouth 2 (two) times daily.    HYDROCODONE-ACETAMINOPHEN (NORCO/VICODIN) 5-325 MG PER TABLET    Take one tablet by mouth every 6 hours as needed for pain   INSULIN GLARGINE (LANTUS) 100 UNIT/ML INJECTION    Inject 0.3 mLs (30 Units total) into the skin at bedtime.   INSULIN LISPRO PROTAMINE-LISPRO (HUMALOG 75/25) (75-25) 100 UNIT/ML SUSP INJECTION    Inject 32 Units into the  skin 2 (two) times daily with a meal.    ISOSORBIDE DINITRATE (ISORDIL) 20 MG TABLET    Take 40 mg by mouth 2 (two) times daily.   LOSARTAN (COZAAR) 100 MG TABLET    Take 100 mg by mouth daily.   METOCLOPRAMIDE (REGLAN) 5 MG TABLET    Take 1 tablet (5 mg total) by mouth daily.   METOPROLOL SUCCINATE (TOPROL-XL) 25 MG 24 HR TABLET    Take 50 mg by mouth daily.    OMEPRAZOLE (PRILOSEC) 20 MG CAPSULE    Take 1 capsule (20 mg total) by mouth daily.   SENNOSIDES-DOCUSATE SODIUM (SENOKOT-S) 8.6-50 MG TABLET    Take 1 tablet by mouth daily as needed for constipation.   SERTRALINE (ZOLOFT) 50 MG TABLET    Take 50 mg by mouth daily.  Modified Medications   Modified Medication Previous Medication   RISPERIDONE (RISPERDAL) 1 MG/ML ORAL SOLUTION risperiDONE (RISPERDAL) 1 MG/ML oral solution      Take 0.5 mg by mouth at bedtime. Take 0.25 mg nightly for one week then 0.5 mg nightly    Take 0.3-0.5 mLs (0.3-0.5 mg total) by mouth at bedtime. Take 0.25 mg nightly for one week then 0.5 mg nightly  Discontinued Medications   No medications on file    SIGNIFICANT DIAGNOSTIC EXAMS  03-01-13: ct of head: Atrophy and chronic ischemia.  No acute abnormality.  03-01-13: ct of cervical spine:  Negative for fracture.  03-01-13: pelvic x-ray: No acute finding.  Stable compared to prior exam.  12-01-13: right elbow x-ray: no acute fracture malalignment no lytic destructive lesion. No significant joint space narrowing.   12-01-13: right forearm x-ray:  Mild diffuse osteopenia no fracture  12-01-13: right wrist x-ray: mild diffuse osteopenia present; no fracture present. Mild soft tissue swelling present.     LABS REVIEWED:   01-29-13: tsh 0.91 02-01-13: wbc 9.0; hgb 16.5; hct 46.7; mcv 85.5 ;plt 184 02-08-13: glucose 254; bun 11; creat 0.46; k+3.5; na++137 02-13-13: wbc 9.7; hgb 14.7; hct 43.8; mcv 88.8; plt 274; glucose 241; bun 32; creat 0.94; k+4.7 Na++137; chol 148; ldl 98; trig 131; hgb a1c 8.2  02-19-13:  glucose 154; bun 18; creat 0.64; k+4.4; na++131 hgb a1c 9.1 03-09-13: urine culture: p.mirabilis: gentamycin 04-23-13; Hgb a1c 7.4  06-22-13: wbc 9.5; hgb 14.4; hct 42.7; mcv 92 plt 235; glucose 147; bun 14; creat 0.85; k+3.6; na++136; liver normal albumin 3.6  07-19-13: wbc 6.8; hgb 14.2 ;hct 44.5 mcv 93.9; plt 236; glucose 73; bun  15; creat 0.7; k+4.0; na++137; chol 142; ldl 92; trig 77 08-02-13: hgb a1c 6.0  10-07-13: hgb a1c 6.3  11-15-13: urine culture: e-coli: macrobid     Review of Systems  Constitutional: Negative for malaise/fatigue.  Respiratory: Negative for cough and shortness of breath.   Cardiovascular: Negative for chest pain.  Gastrointestinal: Negative for abdominal pain.  Musculoskeletal: Negative for joint pain.  Psychiatric/Behavioral: The patient is not nervous/anxious.     Physical Exam  Constitutional: She appears well-developed and well-nourished.  thin  Neck: Neck supple. No JVD present. No thyromegaly present.  Cardiovascular: Normal rate, regular rhythm and intact distal pulses.   Respiratory: Effort normal and breath sounds normal. No respiratory distress. She has no wheezes.  GI: Soft. Bowel sounds are normal. She exhibits no distension. There is no tenderness.  Musculoskeletal: She exhibits no edema. right forearm: swelling with bruising present there is tenderness present to palpation Neurological: She is alert.  Skin: Skin is warm.      ASSESSMENT/ PLAN:  Right arm swelling: her x-ray is negative for fracture; will use an ace wrap as she tolerates for comfort and will have therapy evaluate and treat for pain management and swelling; will continue to monitor her status.   Time spent with patient 45 minutes.     Synthia Innocent NP [facility]  Contact [phone] Monday through Friday 8am- 5pm  After hours call [phone]

## 2013-12-14 ENCOUNTER — Other Ambulatory Visit: Payer: Self-pay

## 2013-12-16 LAB — BASIC METABOLIC PANEL
BUN: 9 mg/dL (ref 4–21)
Creatinine: 0.6 mg/dL (ref 0.5–1.1)
Glucose: 211 mg/dL
Potassium: 4 mmol/L (ref 3.4–5.3)
Sodium: 141 mmol/L (ref 137–147)

## 2013-12-16 LAB — URINE CULTURE

## 2013-12-17 ENCOUNTER — Non-Acute Institutional Stay (SKILLED_NURSING_FACILITY): Payer: Medicare Other | Admitting: Adult Health

## 2013-12-17 DIAGNOSIS — I10 Essential (primary) hypertension: Secondary | ICD-10-CM

## 2013-12-23 ENCOUNTER — Encounter: Payer: Self-pay | Admitting: Adult Health

## 2013-12-23 NOTE — Progress Notes (Signed)
Patient ID: [name], female   DOB: 09-17-1934, 80 y.o.   MRN: [id number]     [facility]  Allergies  Allergen Reactions  . Ace Inhibitors     unknown  . Baclofen     unknown  . Cefaclor     unknown  . Chlorzoxazone     unknown  . Cloxacillin     unknown  . Codeine     unknown  . Diclofenac Sodium     unknown  . Erythromycin     unknown  . Meperidine Hcl     unknown  . Nsaids     unknown  . Promethazine     unknown  . Propoxyphene N-Acetaminophen     unknown  . Sulfonamide Derivatives     unknown      Chief Complaint  Patient presents with  . Acute Visit    follow up blood pressure     HPI:  She has had low blood pressure readings. She is presently taking numerous medications for the management of her blood pressure. The nursing staff is concerned about the low readings. She is not voicing any complaints of chest pain or shortness of breath.   Past Medical History  Diagnosis Date  . DIABETES MELLITUS, TYPE I 05/30/2007  . HYPERCHOLESTEROLEMIA 10/23/2008  . HYPERTENSION 05/30/2007  . OSTEOARTHRITIS, LUMBAR SPINE 12/25/2007  . Depression 10/29/2011  . GERD (gastroesophageal reflux disease) 10/29/2011  . Diastolic CHF, chronic   . CAD (coronary artery disease)   . Anxiety problem   . Wedge compression fracture of T11-T12 vertebra   . Lumbar degenerative disc disease   . Anemia, unspecified   . Lumbar spinal stenosis   . Allergic rhinitis, cause unspecified 10/29/2011  . Osteoporosis 10/29/2011  . Failure to thrive in childhood 10/29/2011  . Osteoarthritis of right knee 10/29/2011  . History of colon polyps 11/01/2011  . Chronic pain 11/01/2011  . Cryptogenic stroke 01/2013    MRI demonstrated acute infarct in the right PCA territory as well as numerous other small acute infarcts in the left ACA, left PCA and bilateral MCA territories all consistent with embolic CVA. Carotid Dopplers were negative for ICA stenosis. Echocardiogram 02/02/13: EF 60-65%, normal  wall motion. TEE 4/23 (performed by Dr. Elease Hashimoto): EF 55-60%, mild AI, no LAA clot, normal valves    Past Surgical History  Procedure Laterality Date  . Abdominal hysterectomy    . Cholecystectomy    . Appendectomy    . Back surgury    . Tee without cardioversion N/A 02/06/2013    Procedure: TRANSESOPHAGEAL ECHOCARDIOGRAM (TEE);  Surgeon: Vesta Mixer, MD;  Location: [facility] ENDOSCOPY;  Service: Cardiovascular;  Laterality: N/A;    VITAL SIGNS BP 110/62  Pulse 68  Ht 5\' 3"  (1.6 m)  Wt 144 lb (65.318 kg)  BMI 25.51 kg/m2   Patient's Medications  New Prescriptions   No medications on file  Previous Medications   AMLODIPINE (NORVASC) 5 MG TABLET    Take 5 mg by mouth daily.   ASPIRIN 325 MG TABLET    Take 1 tablet (325 mg total) by mouth daily.   ATORVASTATIN (LIPITOR) 10 MG TABLET    Take 1 tablet (10 mg total) by mouth daily.   CLONAZEPAM (KLONOPIN) 0.5 MG TABLET    Take 0.25 mg by mouth 2 (two) times daily as needed for anxiety.   FEXOFENADINE (ALLEGRA) 180 MG TABLET    Take 1 tablet (180 mg total) by mouth daily.  FLUTICASONE (FLONASE) 50 MCG/ACT NASAL SPRAY    Place 1 spray into the nose at bedtime. In each nostril   HYDRALAZINE (APRESOLINE) 50 MG TABLET    Take 50 mg by mouth 2 (two) times daily.    HYDROCODONE-ACETAMINOPHEN (NORCO/VICODIN) 5-325 MG PER TABLET    Take one tablet by mouth every 6 hours as needed for pain   INSULIN GLARGINE (LANTUS) 100 UNIT/ML INJECTION    Inject 0.3 mLs (30 Units total) into the skin at bedtime.   INSULIN LISPRO PROTAMINE-LISPRO (HUMALOG 75/25) (75-25) 100 UNIT/ML SUSP INJECTION    Inject 32 Units into the skin 2 (two) times daily with a meal.    ISOSORBIDE DINITRATE (ISORDIL) 20 MG TABLET    Take 40 mg by mouth 2 (two) times daily.   LOSARTAN (COZAAR) 100 MG TABLET    Take 100 mg by mouth daily.   METOCLOPRAMIDE (REGLAN) 5 MG TABLET    Take 1 tablet (5 mg total) by mouth daily.   METOPROLOL SUCCINATE (TOPROL-XL) 25 MG 24 HR TABLET    Take 50 mg  by mouth daily.    OMEPRAZOLE (PRILOSEC) 20 MG CAPSULE    Take 1 capsule (20 mg total) by mouth daily.   RISPERIDONE (RISPERDAL) 1 MG/ML ORAL SOLUTION    Take 0.5 mg by mouth at bedtime. Take 0.25 mg nightly for one week then 0.5 mg nightly   SENNOSIDES-DOCUSATE SODIUM (SENOKOT-S) 8.6-50 MG TABLET    Take 1 tablet by mouth daily as needed for constipation.   SERTRALINE (ZOLOFT) 50 MG TABLET    Take 50 mg by mouth daily.  Modified Medications   No medications on file  Discontinued Medications   No medications on file    SIGNIFICANT DIAGNOSTIC EXAMS  03-01-13: ct of head: Atrophy and chronic ischemia.  No acute abnormality.  03-01-13: ct of cervical spine:  Negative for fracture.  03-01-13: pelvic x-ray: No acute finding.  Stable compared to prior exam.  12-01-13: right elbow x-ray: no acute fracture malalignment no lytic destructive lesion. No significant joint space narrowing.   12-01-13: right forearm x-ray:  Mild diffuse osteopenia no fracture  12-01-13: right wrist x-ray: mild diffuse osteopenia present; no fracture present. Mild soft tissue swelling present.     LABS REVIEWED:   01-29-13: tsh 0.91 02-01-13: wbc 9.0; hgb 16.5; hct 46.7; mcv 85.5 ;plt 184 02-08-13: glucose 254; bun 11; creat 0.46; k+3.5; na++137 02-13-13: wbc 9.7; hgb 14.7; hct 43.8; mcv 88.8; plt 274; glucose 241; bun 32; creat 0.94; k+4.7 Na++137; chol 148; ldl 98; trig 131; hgb a1c 8.2  02-19-13: glucose 154; bun 18; creat 0.64; k+4.4; na++131 hgb a1c 9.1 03-09-13: urine culture: p.mirabilis: gentamycin 04-23-13; Hgb a1c 7.4  06-22-13: wbc 9.5; hgb 14.4; hct 42.7; mcv 92 plt 235; glucose 147; bun 14; creat 0.85; k+3.6; na++136; liver normal albumin 3.6  07-19-13: wbc 6.8; hgb 14.2 ;hct 44.5 mcv 93.9; plt 236; glucose 73; bun 15; creat 0.7; k+4.0; na++137; chol 142; ldl 92; trig 77 08-02-13: hgb a1c 6.0  10-07-13: hgb a1c 6.3  11-15-13: urine culture: e-coli: macrobid  12-14-13: urine culture: no growth 12-16-13: glucose  211; bun 9; creat 0.6; k+4.0; na++141     Review of Systems  Constitutional: Negative for malaise/fatigue.  Respiratory: Negative for cough and shortness of breath.   Cardiovascular: Negative for chest pain.  Gastrointestinal: Negative for abdominal pain.  Musculoskeletal: Negative for joint pain.  Psychiatric/Behavioral: The patient is not nervous/anxious.     Physical Exam  Constitutional: She  appears well-developed and well-nourished.  thin  Neck: Neck supple. No JVD present. No thyromegaly present.  Cardiovascular: Normal rate, regular rhythm and intact distal pulses.   Respiratory: Effort normal and breath sounds normal. No respiratory distress. She has no wheezes.  GI: Soft. Bowel sounds are normal. She exhibits no distension. There is no tenderness.  Musculoskeletal: She exhibits no edema. right forearm: swelling with bruising present Neurological: She is alert.  Skin: Skin is warm.      ASSESSMENT/ PLAN:  1. Hypertension: she has had too low of readings; will stop the norvasc 5 mg now; will continue her apresoline 50 mg twice daily; cozaar 100 mg daily; toprol xl 50 mg daily; will have nursing check blood pressure every shift for one week and will continue to monitor his status.      Synthia Innocent NP [facility]  Contact [phone] Monday through Friday 8am- 5pm  After hours call [phone]

## 2013-12-30 ENCOUNTER — Non-Acute Institutional Stay (SKILLED_NURSING_FACILITY): Payer: Medicare Other | Admitting: Adult Health

## 2013-12-30 ENCOUNTER — Encounter: Payer: Self-pay | Admitting: Adult Health

## 2013-12-30 DIAGNOSIS — F32A Depression, unspecified: Secondary | ICD-10-CM

## 2013-12-30 DIAGNOSIS — K219 Gastro-esophageal reflux disease without esophagitis: Secondary | ICD-10-CM

## 2013-12-30 DIAGNOSIS — E876 Hypokalemia: Secondary | ICD-10-CM

## 2013-12-30 DIAGNOSIS — J309 Allergic rhinitis, unspecified: Secondary | ICD-10-CM

## 2013-12-30 DIAGNOSIS — I2510 Atherosclerotic heart disease of native coronary artery without angina pectoris: Secondary | ICD-10-CM

## 2013-12-30 DIAGNOSIS — F329 Major depressive disorder, single episode, unspecified: Secondary | ICD-10-CM

## 2013-12-30 DIAGNOSIS — F29 Unspecified psychosis not due to a substance or known physiological condition: Secondary | ICD-10-CM

## 2013-12-30 DIAGNOSIS — E7800 Pure hypercholesterolemia, unspecified: Secondary | ICD-10-CM

## 2013-12-30 DIAGNOSIS — F3289 Other specified depressive episodes: Secondary | ICD-10-CM

## 2013-12-30 DIAGNOSIS — S40029A Contusion of unspecified upper arm, initial encounter: Secondary | ICD-10-CM

## 2013-12-30 DIAGNOSIS — I10 Essential (primary) hypertension: Secondary | ICD-10-CM

## 2013-12-30 NOTE — Progress Notes (Signed)
Patient ID: [name], female   DOB: 1933-11-06, 80 y.o.   MRN: [id number]     [facility]  Allergies  Allergen Reactions  . Ace Inhibitors     unknown  . Baclofen     unknown  . Cefaclor     unknown  . Chlorzoxazone     unknown  . Cloxacillin     unknown  . Codeine     unknown  . Erythromycin     unknown  . Meperidine Hcl     unknown  . Nsaids     unknown  . Promethazine     unknown  . Sulfonamide Derivatives     unknown     Chief Complaint  Patient presents with  . Medical Managment of Chronic Issues    HPI:  She is being seen for the management of her chronic illnesses. She has a significant hematoma and bruise on her right forearm. She frequently will not take pain medication despite having pain present. She continues to have behavioral issues with being aggressive toward staff members and yelling out continuously   Past Medical History  Diagnosis Date  . DIABETES MELLITUS, TYPE I 05/30/2007  . HYPERCHOLESTEROLEMIA 10/23/2008  . HYPERTENSION 05/30/2007  . OSTEOARTHRITIS, LUMBAR SPINE 12/25/2007  . Depression 10/29/2011  . GERD (gastroesophageal reflux disease) 10/29/2011  . Diastolic CHF, chronic   . CAD (coronary artery disease)   . Anxiety problem   . Wedge compression fracture of T11-T12 vertebra   . Lumbar degenerative disc disease   . Anemia, unspecified   . Lumbar spinal stenosis   . Allergic rhinitis, cause unspecified 10/29/2011  . Osteoporosis 10/29/2011  . Failure to thrive in childhood 10/29/2011  . Osteoarthritis of right knee 10/29/2011  . History of colon polyps 11/01/2011  . Chronic pain 11/01/2011  . Cryptogenic stroke 01/2013    MRI demonstrated acute infarct in the right PCA territory as well as numerous other small acute infarcts in the left ACA, left PCA and bilateral MCA territories all consistent with embolic CVA. Carotid Dopplers were negative for ICA stenosis. Echocardiogram 02/02/13: EF 60-65%, normal wall motion. TEE 4/23  (performed by Dr. Elease HashimotoNahser): EF 55-60%, mild AI, no LAA clot, normal valves    Past Surgical History  Procedure Laterality Date  . Abdominal hysterectomy    . Cholecystectomy    . Appendectomy    . Back surgury    . Tee without cardioversion N/A 02/06/2013    Procedure: TRANSESOPHAGEAL ECHOCARDIOGRAM (TEE);  Surgeon: Vesta MixerPhilip J Nahser, MD;  [facility];  Service: Cardiovascular;  Laterality: N/A;    VITAL SIGNS BP 104/60  Pulse 69  Ht 5\' 3"  (1.6 m)  Wt 144 lb (65.318 kg)  BMI 25.51 kg/m2   Patient's Medications  New Prescriptions   No medications on file  Previous Medications   ASPIRIN 325 MG TABLET    Take 1 tablet (325 mg total) by mouth daily.   ATORVASTATIN (LIPITOR) 10 MG TABLET    Take 1 tablet (10 mg total) by mouth daily.   FEXOFENADINE (ALLEGRA) 180 MG TABLET    Take 1 tablet (180 mg total) by mouth daily.   FLUTICASONE (FLONASE) 50 MCG/ACT NASAL SPRAY    Place 1 spray into the nose at bedtime. In each nostril   HYDRALAZINE (APRESOLINE) 50 MG TABLET    Take 50 mg by mouth 2 (two) times daily.    HYDROCODONE-ACETAMINOPHEN (NORCO/VICODIN) 5-325 MG PER TABLET    Take one tablet by mouth  every 6 hours as needed for pain   INSULIN GLARGINE (LANTUS) 100 UNIT/ML INJECTION    Inject 0.3 mLs (30 Units total) into the skin at bedtime.   INSULIN LISPRO PROTAMINE-LISPRO (HUMALOG 75/25) (75-25) 100 UNIT/ML SUSP INJECTION    Inject 32 Units into the skin 2 (two) times daily with a meal.    ISOSORBIDE DINITRATE (ISORDIL) 20 MG TABLET    Take 40 mg by mouth 2 (two) times daily.   LOSARTAN (COZAAR) 100 MG TABLET    Take 100 mg by mouth daily.   METOCLOPRAMIDE (REGLAN) 5 MG TABLET    Take 1 tablet (5 mg total) by mouth daily.   METOPROLOL SUCCINATE (TOPROL-XL) 25 MG 24 HR TABLET    Take 50 mg by mouth daily.    NONFORMULARY OR COMPOUNDED ITEM    Apply 0.5 mg topically 2 (two) times daily as needed. Ativan gel 0.5 mg twice daily as needed.   OMEPRAZOLE (PRILOSEC) 20 MG CAPSULE     Take 1 capsule (20 mg total) by mouth daily.   RISPERIDONE (RISPERDAL) 1 MG/ML ORAL SOLUTION    Take 0.5 mg by mouth at bedtime. Take 0.25 mg nightly for one week then 0.5 mg nightly   SENNOSIDES-DOCUSATE SODIUM (SENOKOT-S) 8.6-50 MG TABLET    Take 1 tablet by mouth daily as needed for constipation.   SERTRALINE (ZOLOFT) 50 MG TABLET    Take 50 mg by mouth daily.  Modified Medications   No medications on file  Discontinued Medications   CLONAZEPAM (KLONOPIN) 0.5 MG TABLET    Take 0.25 mg by mouth 2 (two) times daily as needed for anxiety.    SIGNIFICANT DIAGNOSTIC EXAMS  03-01-13: ct of head: Atrophy and chronic ischemia.  No acute abnormality.  03-01-13: ct of cervical spine:  Negative for fracture.  03-01-13: pelvic x-ray: No acute finding.  Stable compared to prior exam.  12-01-13: right elbow x-ray: no acute fracture malalignment no lytic destructive lesion. No significant joint space narrowing.   12-01-13: right forearm x-ray:  Mild diffuse osteopenia no fracture  12-01-13: right wrist x-ray: mild diffuse osteopenia present; no fracture present. Mild soft tissue swelling present.   12-18-13: right upper extremity doppler: negative for dvt     LABS REVIEWED:   01-29-13: tsh 0.91 02-01-13: wbc 9.0; hgb 16.5; hct 46.7; mcv 85.5 ;plt 184 02-08-13: glucose 254; bun 11; creat 0.46; k+3.5; na++137 02-13-13: wbc 9.7; hgb 14.7; hct 43.8; mcv 88.8; plt 274; glucose 241; bun 32; creat 0.94; k+4.7 Na++137; chol 148; ldl 98; trig 131; hgb a1c 8.2  02-19-13: glucose 154; bun 18; creat 0.64; k+4.4; na++131 hgb a1c 9.1 03-09-13: urine culture: p.mirabilis: gentamycin 04-23-13; Hgb a1c 7.4  06-22-13: wbc 9.5; hgb 14.4; hct 42.7; mcv 92 plt 235; glucose 147; bun 14; creat 0.85; k+3.6; na++136; liver normal albumin 3.6  07-19-13: wbc 6.8; hgb 14.2 ;hct 44.5 mcv 93.9; plt 236; glucose 73; bun 15; creat 0.7; k+4.0; na++137; chol 142; ldl 92; trig 77 08-02-13: hgb a1c 6.0  10-07-13: hgb a1c 6.3  11-15-13: urine  culture: e-coli: macrobid  12-14-13: urine culture: no growth 12-16-13: glucose 211; bun 9; creat 0.6; k+4.0; na++141      Review of Systems  Constitutional: Negative for malaise/fatigue.  Respiratory: Negative for cough and shortness of breath.   Cardiovascular: Negative for chest pain.  Gastrointestinal: Negative for abdominal pain.  Musculoskeletal: Negative for joint pain.  Psychiatric/Behavioral: The patient is not nervous/anxious.     Physical Exam  Constitutional: She appears  well-developed and well-nourished.  thin  Neck: Neck supple. No JVD present. No thyromegaly present.  Cardiovascular: Normal rate, regular rhythm and intact distal pulses.   Respiratory: Effort normal and breath sounds normal. No respiratory distress. She has no wheezes.  GI: Soft. Bowel sounds are normal. She exhibits no distension. There is no tenderness.  Musculoskeletal: She exhibits no edema. right forearm: swelling with bruising present Neurological: She is alert.  Skin: Skin is warm.     ASSESSMENT/ PLAN:   GERD (gastroesophageal reflux disease) Will continue prilosec 20 mg daily and reglan 5 mg daily will monitor   HYPERTENSION Her blood pressures stable; will continue the cozaar 100 mg daily; norvasc 5 mg daily; toprol xl 50 mg daily and apresoline 50 mg twice daily and will monitor her status    HYPERCHOLESTEROLEMIA Will continue lipitor 10 mg daily and will monitor   Depression Is without change  will continue zoloft 50 mg daily and ativan 0.5 mg gel  twice daily as needed for anxiety and will monitor   Hypokalemia Is stable is not on medication at this time.    CAD (coronary artery disease) Is presently stable will continue her isordil 40 mg twice daily; asa 325 mg daily and will monitor  Allergic rhinitis, cause unspecified Will continue her allegra 180 mg daily and flonase nightly   Diabetes mellitus type 2 with complications, uncontrolled Will continue her lantus 30 units  nightly; humalog mix 75/25: 32 units twice daily will continue to monitor her status.   Psychosis  Will continue risperdal 0.5 mg nightly at this time; is she continues to have issues in the future will make changes.   Right arm hematoma She is presently without change will continue vicodin 5/325 mg every 6 hours as needed for pain              Synthia Innocent NP [facility]  Contact [phone] Monday through Friday 8am- 5pm  After hours call [phone]

## 2013-12-31 ENCOUNTER — Other Ambulatory Visit: Payer: Self-pay | Admitting: *Deleted

## 2013-12-31 MED ORDER — HYDROCODONE-ACETAMINOPHEN 5-325 MG PO TABS: ORAL_TABLET | ORAL | Status: AC

## 2013-12-31 NOTE — Telephone Encounter (Signed)
[facility]

## 2014-01-01 ENCOUNTER — Non-Acute Institutional Stay (SKILLED_NURSING_FACILITY): Payer: Medicare Other | Admitting: Adult Health

## 2014-01-01 DIAGNOSIS — F29 Unspecified psychosis not due to a substance or known physiological condition: Secondary | ICD-10-CM

## 2014-01-01 DIAGNOSIS — S40029A Contusion of unspecified upper arm, initial encounter: Secondary | ICD-10-CM

## 2014-01-02 DIAGNOSIS — S40029A Contusion of unspecified upper arm, initial encounter: Secondary | ICD-10-CM | POA: Insufficient documentation

## 2014-01-06 ENCOUNTER — Other Ambulatory Visit: Payer: Self-pay | Admitting: *Deleted

## 2014-01-06 MED ORDER — AMBULATORY NON FORMULARY MEDICATION: Status: AC

## 2014-01-06 NOTE — Telephone Encounter (Signed)
[facility] 

## 2014-01-07 ENCOUNTER — Non-Acute Institutional Stay (SKILLED_NURSING_FACILITY): Payer: Medicare Other | Admitting: Adult Health

## 2014-01-07 DIAGNOSIS — I10 Essential (primary) hypertension: Secondary | ICD-10-CM

## 2014-01-07 DIAGNOSIS — K219 Gastro-esophageal reflux disease without esophagitis: Secondary | ICD-10-CM

## 2014-01-07 DIAGNOSIS — F29 Unspecified psychosis not due to a substance or known physiological condition: Secondary | ICD-10-CM

## 2014-01-07 DIAGNOSIS — S40029A Contusion of unspecified upper arm, initial encounter: Secondary | ICD-10-CM

## 2014-01-09 ENCOUNTER — Encounter: Payer: Self-pay | Admitting: Adult Health

## 2014-01-09 NOTE — Progress Notes (Signed)
Patient ID: [name], female   DOB: 09-10-1934, 80 y.o.   MRN: [id number]     Allergies  Allergen Reactions  . Ace Inhibitors     unknown  . Baclofen     unknown  . Cefaclor     unknown  . Chlorzoxazone     unknown  . Cloxacillin     unknown  . Codeine     unknown  . Erythromycin     unknown  . Meperidine Hcl     unknown  . Nsaids     unknown  . Promethazine     unknown  . Sulfonamide Derivatives     unknown     Chief Complaint  Patient presents with  . Acute Visit    pain and behavior issues.     HPI:  She is having right arm pain; for which she does not want to take pain medication. I am not certain why she will not take the pain medication.  She just tells me no to the medication. She continues to be aggressive with staff members hitting them and throwing things at them.   Past Medical History  Diagnosis Date  . DIABETES MELLITUS, TYPE I 05/30/2007  . HYPERCHOLESTEROLEMIA 10/23/2008  . HYPERTENSION 05/30/2007  . OSTEOARTHRITIS, LUMBAR SPINE 12/25/2007  . Depression 10/29/2011  . GERD (gastroesophageal reflux disease) 10/29/2011  . Diastolic CHF, chronic   . CAD (coronary artery disease)   . Anxiety problem   . Wedge compression fracture of T11-T12 vertebra   . Lumbar degenerative disc disease   . Anemia, unspecified   . Lumbar spinal stenosis   . Allergic rhinitis, cause unspecified 10/29/2011  . Osteoporosis 10/29/2011  . Failure to thrive in childhood 10/29/2011  . Osteoarthritis of right knee 10/29/2011  . History of colon polyps 11/01/2011  . Chronic pain 11/01/2011  . Cryptogenic stroke 01/2013    MRI demonstrated acute infarct in the right PCA territory as well as numerous other small acute infarcts in the left ACA, left PCA and bilateral MCA territories all consistent with embolic CVA. Carotid Dopplers were negative for ICA stenosis. Echocardiogram 02/02/13: EF 60-65%, normal wall motion. TEE 4/23 (performed by Dr. Elease Hashimoto): EF 55-60%, mild AI, no LAA  clot, normal valves    Past Surgical History  Procedure Laterality Date  . Abdominal hysterectomy    . Cholecystectomy    . Appendectomy    . Back surgury    . Tee without cardioversion N/A 02/06/2013    Procedure: TRANSESOPHAGEAL ECHOCARDIOGRAM (TEE);  Surgeon: Vesta Mixer, MD;  Location: [facility] ENDOSCOPY;  Service: Cardiovascular;  Laterality: N/A;    VITAL SIGNS BP 107/68  Pulse 70  Ht 5\' 3"  (1.6 m)  Wt 144 lb (65.318 kg)  BMI 25.51 kg/m2   Patient's Medications  New Prescriptions   No medications on file  Previous Medications   AMBULATORY NON FORMULARY MEDICATION    Lorazepam 0.5mg /ml Gel Sig:  Apply 1ml topically to skin every 4 hours as needed for anxiety/agitation   ASPIRIN 325 MG TABLET    Take 1 tablet (325 mg total) by mouth daily.   ATORVASTATIN (LIPITOR) 10 MG TABLET    Take 1 tablet (10 mg total) by mouth daily.   FEXOFENADINE (ALLEGRA) 180 MG TABLET    Take 1 tablet (180 mg total) by mouth daily.   FLUTICASONE (FLONASE) 50 MCG/ACT NASAL SPRAY    Place 1 spray into the nose at bedtime. In each nostril   HYDRALAZINE (APRESOLINE) 50  MG TABLET    Take 50 mg by mouth 2 (two) times daily.    HYDROCODONE-ACETAMINOPHEN (NORCO/VICODIN) 5-325 MG PER TABLET    Take one tablet by mouth every 4 hours as needed for pain   INSULIN GLARGINE (LANTUS) 100 UNIT/ML INJECTION    Inject 0.3 mLs (30 Units total) into the skin at bedtime.   INSULIN LISPRO PROTAMINE-LISPRO (HUMALOG 75/25) (75-25) 100 UNIT/ML SUSP INJECTION    Inject 32 Units into the skin 2 (two) times daily with a meal.    ISOSORBIDE DINITRATE (ISORDIL) 20 MG TABLET    Take 40 mg by mouth 2 (two) times daily.   LOSARTAN (COZAAR) 100 MG TABLET    Take 100 mg by mouth daily.   METOCLOPRAMIDE (REGLAN) 5 MG TABLET    Take 1 tablet (5 mg total) by mouth daily.   METOPROLOL SUCCINATE (TOPROL-XL) 25 MG 24 HR TABLET    Take 50 mg by mouth daily.    NONFORMULARY OR COMPOUNDED ITEM    Apply 0.5 mg topically 2 (two) times daily as  needed. Ativan gel 0.5 mg twice daily as needed.   OMEPRAZOLE (PRILOSEC) 20 MG CAPSULE    Take 1 capsule (20 mg total) by mouth daily.   RISPERIDONE (RISPERDAL) 1 MG/ML ORAL SOLUTION    Take 0.5 mg by mouth at bedtime.    SENNOSIDES-DOCUSATE SODIUM (SENOKOT-S) 8.6-50 MG TABLET    Take 1 tablet by mouth daily as needed for constipation.   SERTRALINE (ZOLOFT) 50 MG TABLET    Take 50 mg by mouth daily.  Modified Medications   No medications on file  Discontinued Medications   No medications on file    SIGNIFICANT DIAGNOSTIC EXAMS  03-01-13: ct of head: Atrophy and chronic ischemia.  No acute abnormality.  03-01-13: ct of cervical spine:  Negative for fracture.  03-01-13: pelvic x-ray: No acute finding.  Stable compared to prior exam.  12-01-13: right elbow x-ray: no acute fracture malalignment no lytic destructive lesion. No significant joint space narrowing.   12-01-13: right forearm x-ray:  Mild diffuse osteopenia no fracture  12-01-13: right wrist x-ray: mild diffuse osteopenia present; no fracture present. Mild soft tissue swelling present.   12-18-13: right upper extremity doppler: negative for dvt     LABS REVIEWED:   01-29-13: tsh 0.91 02-01-13: wbc 9.0; hgb 16.5; hct 46.7; mcv 85.5 ;plt 184 02-08-13: glucose 254; bun 11; creat 0.46; k+3.5; na++137 02-13-13: wbc 9.7; hgb 14.7; hct 43.8; mcv 88.8; plt 274; glucose 241; bun 32; creat 0.94; k+4.7 Na++137; chol 148; ldl 98; trig 131; hgb a1c 8.2  02-19-13: glucose 154; bun 18; creat 0.64; k+4.4; na++131 hgb a1c 9.1 03-09-13: urine culture: p.mirabilis: gentamycin 04-23-13; Hgb a1c 7.4  06-22-13: wbc 9.5; hgb 14.4; hct 42.7; mcv 92 plt 235; glucose 147; bun 14; creat 0.85; k+3.6; na++136; liver normal albumin 3.6  07-19-13: wbc 6.8; hgb 14.2 ;hct 44.5 mcv 93.9; plt 236; glucose 73; bun 15; creat 0.7; k+4.0; na++137; chol 142; ldl 92; trig 77 08-02-13: hgb a1c 6.0  10-07-13: hgb a1c 6.3  11-15-13: urine culture: e-coli: macrobid  12-14-13: urine  culture: no growth 12-16-13: glucose 211; bun 9; creat 0.6; k+4.0; na++141      Review of Systems  Constitutional: Negative for malaise/fatigue.  Respiratory: Negative for cough and shortness of breath.   Cardiovascular: Negative for chest pain.  Gastrointestinal: Negative for abdominal pain.  Musculoskeletal: Negative for joint pain.  Psychiatric/Behavioral: The patient is not nervous/anxious.  Physical Exam  Constitutional: She appears well-developed and well-nourished.  thin  Neck: Neck supple. No JVD present. No thyromegaly present.  Cardiovascular: Normal rate, regular rhythm and intact distal pulses.   Respiratory: Effort normal and breath sounds normal. No respiratory distress. She has no wheezes.  GI: Soft. Bowel sounds are normal. She exhibits no distension. There is no tenderness.  Musculoskeletal: She exhibits no edema. right forearm: swelling with bruising present Neurological: She is alert.  Skin: Skin is warm.      ASSESSMENT/ PLAN:  1. Right arm hematoma: will use a lidoderm patch to her right arm to help with pain; she states she will try this for pain; will continue to monitor her status.   2. Psychosis with aggression: will increase her risperdal to 0.5 mg twice daily and will continue her ativan gel as needed and will monitor her status.      Synthia Innocent NP [facility] Medicine  Contact [phone] Monday through Friday 8am- 5pm  After hours call [phone]

## 2014-01-12 MED ORDER — RISPERIDONE MICROSPHERES 25 MG IM SUSR: 25.0000 mg | INTRAMUSCULAR | Status: DC

## 2014-01-12 NOTE — Progress Notes (Signed)
Patient ID: [name], female   DOB: 10/29/1933, 80 y.o.   MRN: [id number]     [facility]  Allergies  Allergen Reactions  . Ace Inhibitors     unknown  . Baclofen     unknown  . Cefaclor     unknown  . Chlorzoxazone     unknown  . Cloxacillin     unknown  . Codeine     unknown  . Erythromycin     unknown  . Meperidine Hcl     unknown  . Nsaids     unknown  . Promethazine     unknown  . Sulfonamide Derivatives     unknown    Chief Complaint  Patient presents with  . Acute Visit    staff concerns     HPI:  She is not taking the lidoerm patch. Her blood pressure readings have been low. She continues to have aggressive issue with staff members. She will decline the po risperdal at times as well. I have discussed with the [facility] provider in order to better manage her behavioral issues.    Past Medical History  Diagnosis Date  . DIABETES MELLITUS, TYPE I 05/30/2007  . HYPERCHOLESTEROLEMIA 10/23/2008  . HYPERTENSION 05/30/2007  . OSTEOARTHRITIS, LUMBAR SPINE 12/25/2007  . Depression 10/29/2011  . GERD (gastroesophageal reflux disease) 10/29/2011  . Diastolic CHF, chronic   . CAD (coronary artery disease)   . Anxiety problem   . Wedge compression fracture of T11-T12 vertebra   . Lumbar degenerative disc disease   . Anemia, unspecified   . Lumbar spinal stenosis   . Allergic rhinitis, cause unspecified 10/29/2011  . Osteoporosis 10/29/2011  . Failure to thrive in childhood 10/29/2011  . Osteoarthritis of right knee 10/29/2011  . History of colon polyps 11/01/2011  . Chronic pain 11/01/2011  . Cryptogenic stroke 01/2013    MRI demonstrated acute infarct in the right PCA territory as well as numerous other small acute infarcts in the left ACA, left PCA and bilateral MCA territories all consistent with embolic CVA. Carotid Dopplers were negative for ICA stenosis. Echocardiogram 02/02/13: EF 60-65%, normal wall motion. TEE 4/23 (performed by Dr. Elease HashimotoNahser): EF 55-60%, mild  AI, no LAA clot, normal valves    Past Surgical History  Procedure Laterality Date  . Abdominal hysterectomy    . Cholecystectomy    . Appendectomy    . Back surgury    . Tee without cardioversion N/A 02/06/2013    Procedure: TRANSESOPHAGEAL ECHOCARDIOGRAM (TEE);  Surgeon: Vesta MixerPhilip J Nahser, MD;  [facility];  Service: Cardiovascular;  Laterality: N/A;    VITAL SIGNS BP 94/60  Pulse 78  Ht 5\' 3"  (1.6 m)  Wt 144 lb (65.318 kg)  BMI 25.51 kg/m2   Patient's Medications  New Prescriptions   No medications on file  Previous Medications   AMBULATORY NON FORMULARY MEDICATION    Lorazepam 0.5mg /ml Gel Sig:  Apply 1ml topically to skin every 4 hours as needed for anxiety/agitation   ASPIRIN 325 MG TABLET    Take 1 tablet (325 mg total) by mouth daily.   ATORVASTATIN (LIPITOR) 10 MG TABLET    Take 1 tablet (10 mg total) by mouth daily.   FEXOFENADINE (ALLEGRA) 180 MG TABLET    Take 1 tablet (180 mg total) by mouth daily.   FLUTICASONE (FLONASE) 50 MCG/ACT NASAL SPRAY    Place 1 spray into the nose at bedtime. In each nostril   HYDRALAZINE (APRESOLINE) 50 MG TABLET  Take 50 mg by mouth 2 (two) times daily.    HYDROCODONE-ACETAMINOPHEN (NORCO/VICODIN) 5-325 MG PER TABLET    Take one tablet by mouth every 4 hours as needed for pain   INSULIN GLARGINE (LANTUS) 100 UNIT/ML INJECTION    Inject 0.3 mLs (30 Units total) into the skin at bedtime.   INSULIN LISPRO PROTAMINE-LISPRO (HUMALOG 75/25) (75-25) 100 UNIT/ML SUSP INJECTION    Inject 32 Units into the skin 2 (two) times daily with a meal.    ISOSORBIDE DINITRATE (ISORDIL) 20 MG TABLET    Take 40 mg by mouth 2 (two) times daily.   LOSARTAN (COZAAR) 100 MG TABLET    Take 100 mg by mouth daily.   METOCLOPRAMIDE (REGLAN) 5 MG TABLET    Take 1 tablet (5 mg total) by mouth daily.   METOPROLOL SUCCINATE (TOPROL-XL) 25 MG 24 HR TABLET    Take 50 mg by mouth daily.    NONFORMULARY OR COMPOUNDED ITEM    Apply 0.5 mg topically 2 (two) times  daily as needed. Ativan gel 0.5 mg twice daily as needed.   OMEPRAZOLE (PRILOSEC) 20 MG CAPSULE    Take 1 capsule (20 mg total) by mouth daily.   RISPERIDONE (RISPERDAL) 1 MG/ML ORAL SOLUTION    Take 0.5 mg by mouth at bedtime.    SENNOSIDES-DOCUSATE SODIUM (SENOKOT-S) 8.6-50 MG TABLET    Take 1 tablet by mouth daily as needed for constipation.   SERTRALINE (ZOLOFT) 50 MG TABLET    Take 50 mg by mouth daily.  Modified Medications   No medications on file  Discontinued Medications   No medications on file    SIGNIFICANT DIAGNOSTIC EXAMS   03-01-13: ct of head: Atrophy and chronic ischemia.  No acute abnormality.  03-01-13: ct of cervical spine:  Negative for fracture.  03-01-13: pelvic x-ray: No acute finding.  Stable compared to prior exam.  12-01-13: right elbow x-ray: no acute fracture malalignment no lytic destructive lesion. No significant joint space narrowing.   12-01-13: right forearm x-ray:  Mild diffuse osteopenia no fracture  12-01-13: right wrist x-ray: mild diffuse osteopenia present; no fracture present. Mild soft tissue swelling present.   12-18-13: right upper extremity doppler: negative for dvt     LABS REVIEWED:   01-29-13: tsh 0.91 02-01-13: wbc 9.0; hgb 16.5; hct 46.7; mcv 85.5 ;plt 184 02-08-13: glucose 254; bun 11; creat 0.46; k+3.5; na++137 02-13-13: wbc 9.7; hgb 14.7; hct 43.8; mcv 88.8; plt 274; glucose 241; bun 32; creat 0.94; k+4.7 Na++137; chol 148; ldl 98; trig 131; hgb a1c 8.2  02-19-13: glucose 154; bun 18; creat 0.64; k+4.4; na++131 hgb a1c 9.1 03-09-13: urine culture: p.mirabilis: gentamycin 04-23-13; Hgb a1c 7.4  06-22-13: wbc 9.5; hgb 14.4; hct 42.7; mcv 92 plt 235; glucose 147; bun 14; creat 0.85; k+3.6; na++136; liver normal albumin 3.6  07-19-13: wbc 6.8; hgb 14.2 ;hct 44.5 mcv 93.9; plt 236; glucose 73; bun 15; creat 0.7; k+4.0; na++137; chol 142; ldl 92; trig 77 08-02-13: hgb a1c 6.0  10-07-13: hgb a1c 6.3  11-15-13: urine culture: e-coli: macrobid    12-14-13: urine culture: no growth 12-16-13: glucose 211; bun 9; creat 0.6; k+4.0; na++141      Review of Systems  Constitutional: Negative for malaise/fatigue.  Respiratory: Negative for cough and shortness of breath.   Cardiovascular: Negative for chest pain.  Gastrointestinal: Negative for abdominal pain.  Musculoskeletal: Negative for joint pain.  Psychiatric/Behavioral: The patient is not nervous/anxious.       Physical Exam  Constitutional: She appears well-developed and well-nourished.  thin  Neck: Neck supple. No JVD present. No thyromegaly present.  Cardiovascular: Normal rate, regular rhythm and intact distal pulses.   Respiratory: Effort normal and breath sounds normal. No respiratory distress. She has no wheezes.  GI: Soft. Bowel sounds are normal. She exhibits no distension. There is no tenderness.  Musculoskeletal: She exhibits no edema. right forearm: swelling with bruising present Neurological: She is alert.  Skin: Skin is warm.       ASSESSMENT/ PLAN:  1. Right arm hematoma: will stop the lidoderm patch on her right arm as she is declining this medication  2. Hypertension: will stop the apresoline and will continue to monitor her status   3. Gerd: will stop the reglan at this time  4. Psychosis with aggression: will change her risperdal to liquid form; will begin risperdal consta 25 mg every 2 weeks and will continue to monitor her status.     Time spent with patient 40 minutes.      Synthia Innocent NP [facility] Medicine  Contact [phone] Monday through Friday 8am- 5pm  After hours call [phone]

## 2014-01-30 ENCOUNTER — Non-Acute Institutional Stay (SKILLED_NURSING_FACILITY): Payer: Medicare Other | Admitting: Adult Health

## 2014-01-30 DIAGNOSIS — E876 Hypokalemia: Secondary | ICD-10-CM

## 2014-01-30 DIAGNOSIS — E1159 Type 2 diabetes mellitus with other circulatory complications: Secondary | ICD-10-CM

## 2014-01-30 DIAGNOSIS — E7800 Pure hypercholesterolemia, unspecified: Secondary | ICD-10-CM

## 2014-01-30 DIAGNOSIS — J309 Allergic rhinitis, unspecified: Secondary | ICD-10-CM

## 2014-01-30 DIAGNOSIS — I2510 Atherosclerotic heart disease of native coronary artery without angina pectoris: Secondary | ICD-10-CM

## 2014-01-30 DIAGNOSIS — K219 Gastro-esophageal reflux disease without esophagitis: Secondary | ICD-10-CM

## 2014-01-30 DIAGNOSIS — I10 Essential (primary) hypertension: Secondary | ICD-10-CM

## 2014-01-30 DIAGNOSIS — S40029A Contusion of unspecified upper arm, initial encounter: Secondary | ICD-10-CM

## 2014-02-06 ENCOUNTER — Non-Acute Institutional Stay (SKILLED_NURSING_FACILITY): Payer: Medicare Other | Admitting: Adult Health

## 2014-02-06 ENCOUNTER — Encounter: Payer: Self-pay | Admitting: Adult Health

## 2014-02-06 DIAGNOSIS — I6350 Cerebral infarction due to unspecified occlusion or stenosis of unspecified cerebral artery: Secondary | ICD-10-CM

## 2014-02-06 DIAGNOSIS — I639 Cerebral infarction, unspecified: Secondary | ICD-10-CM

## 2014-02-06 DIAGNOSIS — I5032 Chronic diastolic (congestive) heart failure: Secondary | ICD-10-CM

## 2014-02-06 DIAGNOSIS — F29 Unspecified psychosis not due to a substance or known physiological condition: Secondary | ICD-10-CM

## 2014-02-06 DIAGNOSIS — I509 Heart failure, unspecified: Secondary | ICD-10-CM

## 2014-02-06 NOTE — Progress Notes (Signed)
Patient ID: [name], female   DOB: 1934/06/17, 80 y.o.   MRN: [id number]     [facility]  Allergies  Allergen Reactions  . Ace Inhibitors     unknown  . Baclofen     unknown  . Cefaclor     unknown  . Chlorzoxazone     unknown  . Cloxacillin     unknown  . Codeine     unknown  . Erythromycin     unknown  . Meperidine Hcl     unknown  . Nsaids     unknown  . Promethazine     unknown  . Sulfonamide Derivatives     unknown     Chief Complaint  Patient presents with  . Medical Management of Chronic Issues    HPI:  She is being seen for the management of her chronic illnesses. She is being followed by [facility] for her psychosis. She is now being treated with risperdal consta and low dose po risperdal in order to better manage her behavorial issues. She continues to have issues present; striking out at others.   Past Medical History  Diagnosis Date  . DIABETES MELLITUS, TYPE I 05/30/2007  . HYPERCHOLESTEROLEMIA 10/23/2008  . HYPERTENSION 05/30/2007  . OSTEOARTHRITIS, LUMBAR SPINE 12/25/2007  . Depression 10/29/2011  . GERD (gastroesophageal reflux disease) 10/29/2011  . Diastolic CHF, chronic   . CAD (coronary artery disease)   . Anxiety problem   . Wedge compression fracture of T11-T12 vertebra   . Lumbar degenerative disc disease   . Anemia, unspecified   . Lumbar spinal stenosis   . Allergic rhinitis, cause unspecified 10/29/2011  . Osteoporosis 10/29/2011  . Failure to thrive in childhood 10/29/2011  . Osteoarthritis of right knee 10/29/2011  . History of colon polyps 11/01/2011  . Chronic pain 11/01/2011  . Cryptogenic stroke 01/2013    MRI demonstrated acute infarct in the right PCA territory as well as numerous other small acute infarcts in the left ACA, left PCA and bilateral MCA territories all consistent with embolic CVA. Carotid Dopplers were negative for ICA stenosis. Echocardiogram 02/02/13: EF 60-65%, normal wall motion. TEE 4/23 (performed by Dr.  Elease HashimotoNahser): EF 55-60%, mild AI, no LAA clot, normal valves    Past Surgical History  Procedure Laterality Date  . Abdominal hysterectomy    . Cholecystectomy    . Appendectomy    . Back surgury    . Tee without cardioversion N/A 02/06/2013    Procedure: TRANSESOPHAGEAL ECHOCARDIOGRAM (TEE);  Surgeon: Vesta MixerPhilip J Nahser, MD;  [facility];  Service: Cardiovascular;  Laterality: N/A;    VITAL SIGNS BP 140/78  Pulse 70  Ht 5\' 3"  (1.6 m)  Wt 113 lb 9.6 oz (51.529 kg)  BMI 20.13 kg/m2   Patient's Medications  New Prescriptions   No medications on file  Previous Medications   AMBULATORY NON FORMULARY MEDICATION    Lorazepam 0.5mg /ml Gel Sig:  Apply 1ml topically to skin every 4 hours as needed for anxiety/agitation   ASPIRIN 325 MG TABLET    Take 1 tablet (325 mg total) by mouth daily.   ATORVASTATIN (LIPITOR) 10 MG TABLET    Take 1 tablet (10 mg total) by mouth daily.   FEXOFENADINE (ALLEGRA) 180 MG TABLET    Take 1 tablet (180 mg total) by mouth daily.   FLUTICASONE (FLONASE) 50 MCG/ACT NASAL SPRAY    Place 1 spray into the nose at bedtime. In each nostril   HYDROCODONE-ACETAMINOPHEN (NORCO/VICODIN) 5-325 MG  PER TABLET    Take one tablet by mouth every 4 hours as needed for pain   INSULIN GLARGINE (LANTUS) 100 UNIT/ML INJECTION    Inject 0.3 mLs (30 Units total) into the skin at bedtime.   INSULIN LISPRO PROTAMINE-LISPRO (HUMALOG 75/25) (75-25) 100 UNIT/ML SUSP INJECTION    Inject 32 Units into the skin 2 (two) times daily with a meal.    ISOSORBIDE DINITRATE (ISORDIL) 20 MG TABLET    Take 40 mg by mouth 2 (two) times daily.   LOSARTAN (COZAAR) 100 MG TABLET    Take 100 mg by mouth daily.   METOPROLOL SUCCINATE (TOPROL-XL) 25 MG 24 HR TABLET    Take 50 mg by mouth daily.    OMEPRAZOLE (PRILOSEC) 20 MG CAPSULE    Take 1 capsule (20 mg total) by mouth daily.   RISPERIDONE (RISPERDAL) 1 MG/ML ORAL SOLUTION    Take 0.25 mg by mouth at bedtime.    SENNOSIDES-DOCUSATE SODIUM  (SENOKOT-S) 8.6-50 MG TABLET    Take 1 tablet by mouth daily as needed for constipation.   SERTRALINE (ZOLOFT) 50 MG TABLET    Take 100 mg by mouth daily.   Modified Medications   Modified Medication Previous Medication   RISPERIDONE MICROSPHERES (RISPERDAL CONSTA) 25 MG INJECTION risperiDONE microspheres (RISPERDAL CONSTA) 25 MG injection      Inject 37.5 mg into the muscle every 14 (fourteen) days.    Inject 2 mLs (25 mg total) into the muscle every 14 (fourteen) days.  Discontinued Medications   NONFORMULARY OR COMPOUNDED ITEM    Apply 0.5 mg topically 2 (two) times daily as needed. Ativan gel 0.5 mg twice daily as needed.    SIGNIFICANT DIAGNOSTIC EXAMS   03-01-13: ct of head: Atrophy and chronic ischemia.  No acute abnormality.  03-01-13: ct of cervical spine:  Negative for fracture.  03-01-13: pelvic x-ray: No acute finding.  Stable compared to prior exam.  12-01-13: right elbow x-ray: no acute fracture malalignment no lytic destructive lesion. No significant joint space narrowing.   12-01-13: right forearm x-ray:  Mild diffuse osteopenia no fracture  12-01-13: right wrist x-ray: mild diffuse osteopenia present; no fracture present. Mild soft tissue swelling present.   12-18-13: right upper extremity doppler: negative for dvt      LABS REVIEWED:   02-19-13: glucose 154; bun 18; creat 0.64; k+4.4; na++131 hgb a1c 9.1 03-09-13: urine culture: p.mirabilis: gentamycin 04-23-13; Hgb a1c 7.4  06-22-13: wbc 9.5; hgb 14.4; hct 42.7; mcv 92 plt 235; glucose 147; bun 14; creat 0.85; k+3.6; na++136; liver normal albumin 3.6  07-19-13: wbc 6.8; hgb 14.2 ;hct 44.5 mcv 93.9; plt 236; glucose 73; bun 15; creat 0.7; k+4.0; na++137; chol 142; ldl 92; trig 77 08-02-13: hgb a1c 6.0  10-07-13: hgb a1c 6.3  11-15-13: urine culture: e-coli: macrobid  12-14-13: urine culture: no growth 12-16-13: glucose 211; bun 9; creat 0.6; k+4.0; na++141  01-17-14: wbc 6.7; hgb 13.1 ;hct 42. 2;mcv 92.3 plt 182; glucose 42; bun  7; creat 0.6; k+3.4; na++142 chol 108; ldl 71; trig 53 01-20-14: chol 133; ldl 88; trig 84; glucose 197; bun 13; creat 0.7; k+4.2; na++ 137       Review of Systems  Constitutional: Negative for malaise/fatigue.  Respiratory: Negative for cough and shortness of breath.   Cardiovascular: Negative for chest pain.  Gastrointestinal: Negative for abdominal pain.  Musculoskeletal: Negative for joint pain.  Psychiatric/Behavioral: The patient is not nervous/anxious.     Physical Exam  Constitutional: She appears well-developed  and well-nourished.  thin  Neck: Neck supple. No JVD present. No thyromegaly present.  Cardiovascular: Normal rate, regular rhythm and intact distal pulses.   Respiratory: Effort normal and breath sounds normal. No respiratory distress. She has no wheezes.  GI: Soft. Bowel sounds are normal. She exhibits no distension. There is no tenderness.  Musculoskeletal: She exhibits no edema. right forearm: swelling with bruising present Neurological: She is alert.  Skin: Skin is warm.     ASSESSMENT/ PLAN:   GERD (gastroesophageal reflux disease) Will continue prilosec 20 mg daily will stop the reglan    HYPERTENSION Her blood pressures stable; will continue the cozaar 100 mg daily; norvasc 5 mg daily; toprol xl 50 mg daily and apresoline 50 mg twice daily and will monitor her status    HYPERCHOLESTEROLEMIA Will stop her lipitor at this time and will continue to monitor her stautus.   Depression Is without change  will continue zoloft 100 mg daily and ativan 0.5 mg gel  Every 4 hours  as needed for anxiety and will monitor   Hypokalemia Is stable is not on medication at this time.    CAD (coronary artery disease) Is presently stable will continue her isordil 40 mg twice daily; asa 325 mg daily and will monitor  Allergic rhinitis, cause unspecified Will continue her allegra 180 mg daily and flonase nightly   Diabetes mellitus type 2 with complications,  uncontrolled Will continue her lantus 30 units nightly; humalog mix 75/25: 32 units twice daily will continue to monitor her status.   Psychosis  Will continue risperdal 0.25 mg nightly at this time; and will continue risperdal const 37.5 mg im every 2 weeks and will continue to monitor her status. She continues to be followed by [facility].   Right arm hematoma The hematoma is resolving  will continue vicodin 5/325 mg every 6 hours as needed for pain           Synthia Innocenteborah Monica Codd NP [facility]  Contact [phone] Monday through Friday 8am- 5pm  After hours call [phone]

## 2014-02-16 ENCOUNTER — Encounter: Payer: Self-pay | Admitting: Adult Health

## 2014-02-16 NOTE — Progress Notes (Signed)
Patient ID: Scot Jun, female   DOB: 15-Dec-1933, 80 y.o.   MRN: [id number]     [facility]  Allergies  Allergen Reactions  . Ace Inhibitors     unknown  . Baclofen     unknown  . Cefaclor     unknown  . Chlorzoxazone     unknown  . Cloxacillin     unknown  . Codeine     unknown  . Erythromycin     unknown  . Meperidine Hcl     unknown  . Nsaids     unknown  . Promethazine     unknown  . Sulfonamide Derivatives     unknown     Chief Complaint  Patient presents with  . Discharge Note    HPI:   She is being discharged to another skilled nursing facility in order to better meet her nursing needs. She will need any dme; will not need home health services. She will need prescriptions written for her narcotics.    Past Medical History  Diagnosis Date  . DIABETES MELLITUS, TYPE I 05/30/2007  . HYPERCHOLESTEROLEMIA 10/23/2008  . HYPERTENSION 05/30/2007  . OSTEOARTHRITIS, LUMBAR SPINE 12/25/2007  . Depression 10/29/2011  . GERD (gastroesophageal reflux disease) 10/29/2011  . Diastolic CHF, chronic   . CAD (coronary artery disease)   . Anxiety problem   . Wedge compression fracture of T11-T12 vertebra   . Lumbar degenerative disc disease   . Anemia, unspecified   . Lumbar spinal stenosis   . Allergic rhinitis, cause unspecified 10/29/2011  . Osteoporosis 10/29/2011  . Failure to thrive in childhood 10/29/2011  . Osteoarthritis of right knee 10/29/2011  . History of colon polyps 11/01/2011  . Chronic pain 11/01/2011  . Cryptogenic stroke 01/2013    MRI demonstrated acute infarct in the right PCA territory as well as numerous other small acute infarcts in the left ACA, left PCA and bilateral MCA territories all consistent with embolic CVA. Carotid Dopplers were negative for ICA stenosis. Echocardiogram 02/02/13: EF 60-65%, normal wall motion. TEE 4/23 (performed by Dr. Elease Hashimoto): EF 55-60%, mild AI, no LAA clot, normal valves    Past Surgical History  Procedure  Laterality Date  . Abdominal hysterectomy    . Cholecystectomy    . Appendectomy    . Back surgury    . Tee without cardioversion N/A 02/06/2013    Procedure: TRANSESOPHAGEAL ECHOCARDIOGRAM (TEE);  Surgeon: Vesta Mixer, MD;  Location: [facility] ENDOSCOPY;  Service: Cardiovascular;  Laterality: N/A;    VITAL SIGNS BP 119/80  Pulse 70  Ht 5\' 3"  (1.6 m)  Wt 113 lb 9.6 oz (51.529 kg)  BMI 20.13 kg/m2   Patient's Medications  New Prescriptions   No medications on file  Previous Medications   AMBULATORY NON FORMULARY MEDICATION    Lorazepam 0.5mg /ml Gel Sig:  Apply 1ml topically to skin every 4 hours as needed for anxiety/agitation   ASPIRIN 325 MG TABLET    Take 1 tablet (325 mg total) by mouth daily.   FEXOFENADINE (ALLEGRA) 180 MG TABLET    Take 1 tablet (180 mg total) by mouth daily.   FLUTICASONE (FLONASE) 50 MCG/ACT NASAL SPRAY    Place 1 spray into the nose at bedtime. In each nostril   HYDROCODONE-ACETAMINOPHEN (NORCO/VICODIN) 5-325 MG PER TABLET    Take one tablet by mouth every 4 hours as needed for pain   INSULIN GLARGINE (LANTUS) 100 UNIT/ML INJECTION    Inject 0.3 mLs (30 Units total) into  the skin at bedtime.   INSULIN LISPRO PROTAMINE-LISPRO (HUMALOG 75/25) (75-25) 100 UNIT/ML SUSP INJECTION    Inject 32 Units into the skin 2 (two) times daily with a meal.    ISOSORBIDE DINITRATE (ISORDIL) 20 MG TABLET    Take 40 mg by mouth 2 (two) times daily.   LOSARTAN (COZAAR) 100 MG TABLET    Take 100 mg by mouth daily.   METOPROLOL SUCCINATE (TOPROL-XL) 25 MG 24 HR TABLET    Take 50 mg by mouth daily.    OMEPRAZOLE (PRILOSEC) 20 MG CAPSULE    Take 1 capsule (20 mg total) by mouth daily.   RISPERIDONE (RISPERDAL) 1 MG/ML ORAL SOLUTION    Take 0.25 mg by mouth at bedtime.    RISPERIDONE MICROSPHERES (RISPERDAL CONSTA) 25 MG INJECTION    Inject 37.5 mg into the muscle every 14 (fourteen) days.   SENNOSIDES-DOCUSATE SODIUM (SENOKOT-S) 8.6-50 MG TABLET    Take 1 tablet by mouth daily as  needed for constipation.   SERTRALINE (ZOLOFT) 50 MG TABLET    Take 100 mg by mouth daily.   Modified Medications   No medications on file  Discontinued Medications   No medications on file    SIGNIFICANT DIAGNOSTIC EXAMS   03-01-13: ct of head: Atrophy and chronic ischemia.  No acute abnormality.  03-01-13: ct of cervical spine:  Negative for fracture.  03-01-13: pelvic x-ray: No acute finding.  Stable compared to prior exam.  12-01-13: right elbow x-ray: no acute fracture malalignment no lytic destructive lesion. No significant joint space narrowing.   12-01-13: right forearm x-ray:  Mild diffuse osteopenia no fracture  12-01-13: right wrist x-ray: mild diffuse osteopenia present; no fracture present. Mild soft tissue swelling present.   12-18-13: right upper extremity doppler: negative for dvt      LABS REVIEWED:   02-19-13: glucose 154; bun 18; creat 0.64; k+4.4; na++131 hgb a1c 9.1 03-09-13: urine culture: p.mirabilis: gentamycin 04-23-13; Hgb a1c 7.4  06-22-13: wbc 9.5; hgb 14.4; hct 42.7; mcv 92 plt 235; glucose 147; bun 14; creat 0.85; k+3.6; na++136; liver normal albumin 3.6  07-19-13: wbc 6.8; hgb 14.2 ;hct 44.5 mcv 93.9; plt 236; glucose 73; bun 15; creat 0.7; k+4.0; na++137; chol 142; ldl 92; trig 77 08-02-13: hgb a1c 6.0  10-07-13: hgb a1c 6.3  11-15-13: urine culture: e-coli: macrobid  12-14-13: urine culture: no growth 12-16-13: glucose 211; bun 9; creat 0.6; k+4.0; na++141  01-17-14: wbc 6.7; hgb 13.1 ;hct 42. 2;mcv 92.3 plt 182; glucose 42; bun 7; creat 0.6; k+3.4; na++142 chol 108; ldl 71; trig 53 01-20-14: chol 133; ldl 88; trig 84; glucose 197; bun 13; creat 0.7; k+4.2; na++ 137       Review of Systems  Constitutional: Negative for malaise/fatigue.  Respiratory: Negative for cough and shortness of breath.   Cardiovascular: Negative for chest pain.  Gastrointestinal: Negative for abdominal pain.  Musculoskeletal: Negative for joint pain.  Psychiatric/Behavioral: The  patient is not nervous/anxious.     Physical Exam  Constitutional: She appears well-developed and well-nourished.  thin  Neck: Neck supple. No JVD present. No thyromegaly present.  Cardiovascular: Normal rate, regular rhythm and intact distal pulses.   Respiratory: Effort normal and breath sounds normal. No respiratory distress. She has no wheezes.  GI: Soft. Bowel sounds are normal. She exhibits no distension. There is no tenderness.  Musculoskeletal: She exhibits no edema. right forearm: swelling with bruising present Neurological: She is alert.  Skin: Skin is warm.     ASSESSMENT/  PLAN:  Will discharge to snf; will not need home health or dme. Prescriptions for narcotics written.

## 2015-12-16 DEATH — deceased
